# Patient Record
Sex: Female | Born: 1955 | Race: Black or African American | Hispanic: No | Marital: Married | Smoking: Former smoker
Health system: Southern US, Community
[De-identification: ages and names within clinical notes are randomized; demographics above are authoritative.]

## PROBLEM LIST (undated history)

## (undated) DIAGNOSIS — D709 Neutropenia, unspecified: Secondary | ICD-10-CM

## (undated) DIAGNOSIS — G4700 Insomnia, unspecified: Secondary | ICD-10-CM

## (undated) DIAGNOSIS — M79673 Pain in unspecified foot: Secondary | ICD-10-CM

## (undated) DIAGNOSIS — N951 Menopausal and female climacteric states: Secondary | ICD-10-CM

## (undated) DIAGNOSIS — J069 Acute upper respiratory infection, unspecified: Secondary | ICD-10-CM

## (undated) DIAGNOSIS — E669 Obesity, unspecified: Secondary | ICD-10-CM

## (undated) DIAGNOSIS — K7581 Nonalcoholic steatohepatitis (NASH): Secondary | ICD-10-CM

## (undated) DIAGNOSIS — D509 Iron deficiency anemia, unspecified: Secondary | ICD-10-CM

## (undated) DIAGNOSIS — T7840XA Allergy, unspecified, initial encounter: Secondary | ICD-10-CM

## (undated) DIAGNOSIS — R7989 Other specified abnormal findings of blood chemistry: Secondary | ICD-10-CM

## (undated) DIAGNOSIS — R739 Hyperglycemia, unspecified: Secondary | ICD-10-CM

## (undated) DIAGNOSIS — M8580 Other specified disorders of bone density and structure, unspecified site: Secondary | ICD-10-CM

## (undated) DIAGNOSIS — D500 Iron deficiency anemia secondary to blood loss (chronic): Secondary | ICD-10-CM

## (undated) DIAGNOSIS — E785 Hyperlipidemia, unspecified: Secondary | ICD-10-CM

## (undated) DIAGNOSIS — E559 Vitamin D deficiency, unspecified: Secondary | ICD-10-CM

## (undated) DIAGNOSIS — M549 Dorsalgia, unspecified: Secondary | ICD-10-CM

## (undated) DIAGNOSIS — R944 Abnormal results of kidney function studies: Secondary | ICD-10-CM

## (undated) DIAGNOSIS — R7303 Prediabetes: Secondary | ICD-10-CM

## (undated) DIAGNOSIS — Z87891 Personal history of nicotine dependence: Secondary | ICD-10-CM

## (undated) DIAGNOSIS — R9431 Abnormal electrocardiogram [ECG] [EKG]: Secondary | ICD-10-CM

## (undated) DIAGNOSIS — Z6837 Body mass index (BMI) 37.0-37.9, adult: Secondary | ICD-10-CM

## (undated) DIAGNOSIS — E66812 Obesity, class 2: Secondary | ICD-10-CM

## (undated) DIAGNOSIS — R609 Edema, unspecified: Secondary | ICD-10-CM

## (undated) DIAGNOSIS — H409 Unspecified glaucoma: Secondary | ICD-10-CM

## (undated) DIAGNOSIS — K7689 Other specified diseases of liver: Secondary | ICD-10-CM

## (undated) DIAGNOSIS — R632 Polyphagia: Secondary | ICD-10-CM

## (undated) DIAGNOSIS — I10 Essential (primary) hypertension: Secondary | ICD-10-CM

## (undated) HISTORY — DX: Pain in unspecified foot: M79.673

## (undated) HISTORY — DX: Abnormal results of kidney function studies: R94.4

## (undated) HISTORY — DX: Nonalcoholic steatohepatitis (NASH): K75.81

## (undated) HISTORY — DX: Vitamin D deficiency, unspecified: E55.9

## (undated) HISTORY — DX: Acute upper respiratory infection, unspecified: J06.9

## (undated) HISTORY — DX: Other specified disorders of bone density and structure, unspecified site: M85.80

## (undated) HISTORY — DX: Neutropenia, unspecified: D70.9

## (undated) HISTORY — PX: HAND SURGERY: SHX662

## (undated) HISTORY — DX: Insomnia, unspecified: G47.00

## (undated) HISTORY — DX: Unspecified glaucoma: H40.9

## (undated) HISTORY — DX: Essential (primary) hypertension: I10

## (undated) HISTORY — DX: Personal history of nicotine dependence: Z87.891

## (undated) HISTORY — DX: Edema, unspecified: R60.9

## (undated) HISTORY — DX: Prediabetes: R73.03

## (undated) HISTORY — DX: Hyperlipidemia, unspecified: E78.5

## (undated) HISTORY — DX: Other specified diseases of liver: K76.89

## (undated) HISTORY — DX: Hyperglycemia, unspecified: R73.9

## (undated) HISTORY — DX: Polyphagia: R63.2

## (undated) HISTORY — DX: Dorsalgia, unspecified: M54.9

## (undated) HISTORY — DX: Other specified abnormal findings of blood chemistry: R79.89

## (undated) HISTORY — DX: Menopausal and female climacteric states: N95.1

## (undated) HISTORY — PX: DENTAL SURGERY: SHX609

## (undated) HISTORY — DX: Allergy, unspecified, initial encounter: T78.40XA

## (undated) HISTORY — DX: Obesity, class 2: E66.812

## (undated) HISTORY — DX: Morbid (severe) obesity due to excess calories: E66.01

## (undated) HISTORY — DX: Obesity, unspecified: E66.9

## (undated) HISTORY — PX: KNEE ARTHROSCOPY: SHX127

## (undated) HISTORY — DX: Abnormal electrocardiogram (ECG) (EKG): R94.31

## (undated) HISTORY — DX: Body mass index (BMI) 37.0-37.9, adult: Z68.37

## (undated) HISTORY — DX: Iron deficiency anemia, unspecified: D50.9

## (undated) HISTORY — PX: COLONOSCOPY: SHX174

## (undated) HISTORY — DX: Iron deficiency anemia secondary to blood loss (chronic): D50.0

---

## 1998-05-12 ENCOUNTER — Ambulatory Visit (HOSPITAL_COMMUNITY): Admission: RE | Admit: 1998-05-12 | Discharge: 1998-05-12 | Payer: Self-pay | Admitting: Gynecology

## 1998-05-12 ENCOUNTER — Encounter: Payer: Self-pay | Admitting: Gynecology

## 1999-05-19 ENCOUNTER — Encounter: Payer: Self-pay | Admitting: Gynecology

## 1999-05-19 ENCOUNTER — Ambulatory Visit (HOSPITAL_COMMUNITY): Admission: RE | Admit: 1999-05-19 | Discharge: 1999-05-19 | Payer: Self-pay | Admitting: Gynecology

## 2000-07-24 ENCOUNTER — Other Ambulatory Visit: Admission: RE | Admit: 2000-07-24 | Discharge: 2000-07-24 | Payer: Self-pay | Admitting: Gynecology

## 2000-09-19 ENCOUNTER — Encounter: Payer: Self-pay | Admitting: Gynecology

## 2000-09-19 ENCOUNTER — Ambulatory Visit (HOSPITAL_COMMUNITY): Admission: RE | Admit: 2000-09-19 | Discharge: 2000-09-19 | Payer: Self-pay | Admitting: Gynecology

## 2001-08-18 ENCOUNTER — Other Ambulatory Visit: Admission: RE | Admit: 2001-08-18 | Discharge: 2001-08-18 | Payer: Self-pay | Admitting: Gynecology

## 2001-09-26 ENCOUNTER — Ambulatory Visit (HOSPITAL_COMMUNITY): Admission: RE | Admit: 2001-09-26 | Discharge: 2001-09-26 | Payer: Self-pay | Admitting: Gynecology

## 2001-09-26 ENCOUNTER — Encounter: Payer: Self-pay | Admitting: Gynecology

## 2001-10-20 ENCOUNTER — Emergency Department (HOSPITAL_COMMUNITY): Admission: EM | Admit: 2001-10-20 | Discharge: 2001-10-20 | Payer: Self-pay | Admitting: *Deleted

## 2002-08-26 ENCOUNTER — Other Ambulatory Visit: Admission: RE | Admit: 2002-08-26 | Discharge: 2002-08-26 | Payer: Self-pay | Admitting: Gynecology

## 2002-11-03 ENCOUNTER — Ambulatory Visit (HOSPITAL_COMMUNITY): Admission: RE | Admit: 2002-11-03 | Discharge: 2002-11-03 | Payer: Self-pay | Admitting: Gynecology

## 2002-11-03 ENCOUNTER — Encounter: Payer: Self-pay | Admitting: Gynecology

## 2004-02-08 ENCOUNTER — Other Ambulatory Visit: Admission: RE | Admit: 2004-02-08 | Discharge: 2004-02-08 | Payer: Self-pay | Admitting: Gynecology

## 2004-03-23 ENCOUNTER — Ambulatory Visit: Payer: Self-pay | Admitting: Endocrinology

## 2004-09-14 ENCOUNTER — Ambulatory Visit (HOSPITAL_COMMUNITY): Admission: RE | Admit: 2004-09-14 | Discharge: 2004-09-14 | Payer: Self-pay | Admitting: Gynecology

## 2004-10-10 ENCOUNTER — Other Ambulatory Visit: Admission: RE | Admit: 2004-10-10 | Discharge: 2004-10-10 | Payer: Self-pay | Admitting: Gynecology

## 2005-03-02 ENCOUNTER — Ambulatory Visit: Payer: Self-pay | Admitting: Endocrinology

## 2005-05-21 ENCOUNTER — Ambulatory Visit: Payer: Self-pay | Admitting: Endocrinology

## 2005-09-17 ENCOUNTER — Ambulatory Visit (HOSPITAL_COMMUNITY): Admission: RE | Admit: 2005-09-17 | Discharge: 2005-09-17 | Payer: Self-pay | Admitting: Gynecology

## 2005-10-18 ENCOUNTER — Other Ambulatory Visit: Admission: RE | Admit: 2005-10-18 | Discharge: 2005-10-18 | Payer: Self-pay | Admitting: Gynecology

## 2005-12-18 ENCOUNTER — Ambulatory Visit: Payer: Self-pay | Admitting: Endocrinology

## 2006-09-19 ENCOUNTER — Ambulatory Visit (HOSPITAL_COMMUNITY): Admission: RE | Admit: 2006-09-19 | Discharge: 2006-09-19 | Payer: Self-pay | Admitting: Gynecology

## 2006-10-24 ENCOUNTER — Other Ambulatory Visit: Admission: RE | Admit: 2006-10-24 | Discharge: 2006-10-24 | Payer: Self-pay | Admitting: Gynecology

## 2006-11-11 ENCOUNTER — Ambulatory Visit: Payer: Self-pay | Admitting: Endocrinology

## 2006-11-11 LAB — CONVERTED CEMR LAB
ALT: 36 units/L — ABNORMAL HIGH (ref 0–35)
AST: 34 units/L (ref 0–37)
Albumin: 3.5 g/dL (ref 3.5–5.2)
BUN: 15 mg/dL (ref 6–23)
Basophils Absolute: 0 10*3/uL (ref 0.0–0.1)
Basophils Relative: 0.9 % (ref 0.0–1.0)
Bilirubin Urine: NEGATIVE
Bilirubin, Direct: 0.1 mg/dL (ref 0.0–0.3)
Cholesterol: 143 mg/dL (ref 0–200)
Eosinophils Relative: 5.4 % — ABNORMAL HIGH (ref 0.0–5.0)
GFR calc Af Amer: 86 mL/min
HCT: 35.3 % — ABNORMAL LOW (ref 36.0–46.0)
LDL Cholesterol: 80 mg/dL (ref 0–99)
Lymphocytes Relative: 46.7 % — ABNORMAL HIGH (ref 12.0–46.0)
Monocytes Absolute: 0.4 10*3/uL (ref 0.2–0.7)
Monocytes Relative: 13.3 % — ABNORMAL HIGH (ref 3.0–11.0)
Neutrophils Relative %: 33.7 % — ABNORMAL LOW (ref 43.0–77.0)
Potassium: 3.9 meq/L (ref 3.5–5.1)
RDW: 12.8 % (ref 11.5–14.6)
Sodium: 146 meq/L — ABNORMAL HIGH (ref 135–145)
Triglycerides: 48 mg/dL (ref 0–149)
WBC: 2.9 10*3/uL — ABNORMAL LOW (ref 4.5–10.5)

## 2006-11-15 ENCOUNTER — Ambulatory Visit: Payer: Self-pay | Admitting: Endocrinology

## 2006-11-15 LAB — CONVERTED CEMR LAB
ALT: 41 units/L — ABNORMAL HIGH (ref 0–35)
AST: 40 units/L — ABNORMAL HIGH (ref 0–37)
Albumin: 3.7 g/dL (ref 3.5–5.2)
Hepatitis B Surface Ag: NEGATIVE
Total Bilirubin: 0.8 mg/dL (ref 0.3–1.2)

## 2007-02-01 ENCOUNTER — Encounter: Payer: Self-pay | Admitting: *Deleted

## 2007-02-01 DIAGNOSIS — I10 Essential (primary) hypertension: Secondary | ICD-10-CM

## 2007-02-01 DIAGNOSIS — E785 Hyperlipidemia, unspecified: Secondary | ICD-10-CM | POA: Insufficient documentation

## 2007-02-01 DIAGNOSIS — G4700 Insomnia, unspecified: Secondary | ICD-10-CM | POA: Insufficient documentation

## 2007-02-01 DIAGNOSIS — Z87891 Personal history of nicotine dependence: Secondary | ICD-10-CM

## 2007-02-01 HISTORY — DX: Insomnia, unspecified: G47.00

## 2007-02-01 HISTORY — DX: Essential (primary) hypertension: I10

## 2007-02-01 HISTORY — DX: Hyperlipidemia, unspecified: E78.5

## 2007-02-01 HISTORY — DX: Personal history of nicotine dependence: Z87.891

## 2007-03-03 ENCOUNTER — Ambulatory Visit: Payer: Self-pay | Admitting: Gastroenterology

## 2007-03-10 ENCOUNTER — Ambulatory Visit: Payer: Self-pay | Admitting: Gastroenterology

## 2007-09-22 ENCOUNTER — Ambulatory Visit (HOSPITAL_COMMUNITY): Admission: RE | Admit: 2007-09-22 | Discharge: 2007-09-22 | Payer: Self-pay | Admitting: Gynecology

## 2007-10-28 ENCOUNTER — Encounter: Payer: Self-pay | Admitting: Endocrinology

## 2007-12-21 ENCOUNTER — Encounter: Payer: Self-pay | Admitting: Endocrinology

## 2008-01-02 ENCOUNTER — Ambulatory Visit: Payer: Self-pay

## 2008-01-02 ENCOUNTER — Ambulatory Visit: Payer: Self-pay | Admitting: Endocrinology

## 2008-01-02 ENCOUNTER — Encounter (INDEPENDENT_AMBULATORY_CARE_PROVIDER_SITE_OTHER): Payer: Self-pay | Admitting: *Deleted

## 2008-01-02 DIAGNOSIS — R609 Edema, unspecified: Secondary | ICD-10-CM | POA: Insufficient documentation

## 2008-01-02 DIAGNOSIS — M25579 Pain in unspecified ankle and joints of unspecified foot: Secondary | ICD-10-CM | POA: Insufficient documentation

## 2008-01-02 HISTORY — DX: Edema, unspecified: R60.9

## 2008-01-05 ENCOUNTER — Encounter: Payer: Self-pay | Admitting: Endocrinology

## 2008-01-08 ENCOUNTER — Ambulatory Visit: Payer: Self-pay | Admitting: Endocrinology

## 2008-01-11 LAB — CONVERTED CEMR LAB
Albumin: 3.6 g/dL (ref 3.5–5.2)
Alkaline Phosphatase: 48 units/L (ref 39–117)
BUN: 14 mg/dL (ref 6–23)
Bilirubin Urine: NEGATIVE
Cholesterol: 119 mg/dL (ref 0–200)
Creatinine, Ser: 0.9 mg/dL (ref 0.4–1.2)
GFR calc Af Amer: 85 mL/min
GFR calc non Af Amer: 70 mL/min
Glucose, Bld: 82 mg/dL (ref 70–99)
HDL: 51 mg/dL (ref >39.0)
Hemoglobin, Urine: NEGATIVE
Ketones, ur: NEGATIVE mg/dL
LDL Cholesterol: 59 mg/dL (ref 0–99)
Leukocytes, UA: NEGATIVE
Potassium: 3.9 meq/L (ref 3.5–5.1)
Specific Gravity, Urine: 1.015 (ref 1.000–1.03)
TSH: 0.57 microintl units/mL (ref 0.35–5.50)
Total CHOL/HDL Ratio: 2.3
Total Protein: 6.8 g/dL (ref 6.0–8.3)
Triglycerides: 46 mg/dL (ref 0–149)
Urine Glucose: NEGATIVE mg/dL
Urobilinogen, UA: 0.2 (ref 0.0–1.0)
VLDL: 9 mg/dL (ref 0–40)

## 2008-01-16 ENCOUNTER — Ambulatory Visit: Payer: Self-pay | Admitting: Endocrinology

## 2008-01-28 ENCOUNTER — Encounter: Payer: Self-pay | Admitting: Endocrinology

## 2008-02-10 ENCOUNTER — Telehealth: Payer: Self-pay | Admitting: Endocrinology

## 2008-02-13 ENCOUNTER — Telehealth (INDEPENDENT_AMBULATORY_CARE_PROVIDER_SITE_OTHER): Payer: Self-pay | Admitting: *Deleted

## 2008-02-18 ENCOUNTER — Encounter: Payer: Self-pay | Admitting: Endocrinology

## 2008-04-05 ENCOUNTER — Ambulatory Visit: Payer: Self-pay | Admitting: Endocrinology

## 2008-04-05 DIAGNOSIS — M79609 Pain in unspecified limb: Secondary | ICD-10-CM | POA: Insufficient documentation

## 2008-04-05 DIAGNOSIS — D500 Iron deficiency anemia secondary to blood loss (chronic): Secondary | ICD-10-CM

## 2008-04-05 HISTORY — DX: Iron deficiency anemia secondary to blood loss (chronic): D50.0

## 2008-09-23 ENCOUNTER — Ambulatory Visit (HOSPITAL_COMMUNITY): Admission: RE | Admit: 2008-09-23 | Discharge: 2008-09-23 | Payer: Self-pay | Admitting: Gynecology

## 2008-10-05 LAB — HM PAP SMEAR

## 2009-01-28 ENCOUNTER — Ambulatory Visit: Payer: Self-pay | Admitting: Endocrinology

## 2009-01-29 LAB — CONVERTED CEMR LAB
Albumin: 3.9 g/dL (ref 3.5–5.2)
Basophils Relative: 0.9 % (ref 0.0–3.0)
CO2: 31 meq/L (ref 19–32)
Chloride: 102 meq/L (ref 96–112)
Creatinine, Ser: 0.8 mg/dL (ref 0.4–1.2)
Eosinophils Absolute: 0.2 10*3/uL (ref 0.0–0.7)
Glucose, Bld: 85 mg/dL (ref 70–99)
Hemoglobin, Urine: NEGATIVE
Hemoglobin: 12.6 g/dL (ref 12.0–15.0)
Leukocytes, UA: NEGATIVE
MCHC: 33.1 g/dL (ref 30.0–36.0)
MCV: 88.6 fL (ref 78.0–100.0)
Monocytes Absolute: 0.5 10*3/uL (ref 0.1–1.0)
Neutro Abs: 0.8 10*3/uL — ABNORMAL LOW (ref 1.4–7.7)
Nitrite: NEGATIVE
RBC: 4.29 M/uL (ref 3.87–5.11)
RDW: 12.3 % (ref 11.5–14.6)
Sodium: 142 meq/L (ref 135–145)
TSH: 1.82 microintl units/mL (ref 0.35–5.50)
Total CHOL/HDL Ratio: 3
Total Protein: 7.5 g/dL (ref 6.0–8.3)
Triglycerides: 58 mg/dL (ref 0.0–149.0)
Urobilinogen, UA: 0.2 (ref 0.0–1.0)

## 2009-01-31 ENCOUNTER — Ambulatory Visit: Payer: Self-pay | Admitting: Endocrinology

## 2009-01-31 DIAGNOSIS — D709 Neutropenia, unspecified: Secondary | ICD-10-CM | POA: Insufficient documentation

## 2009-01-31 HISTORY — DX: Neutropenia, unspecified: D70.9

## 2009-02-01 ENCOUNTER — Ambulatory Visit: Payer: Self-pay | Admitting: Oncology

## 2009-02-16 ENCOUNTER — Encounter: Payer: Self-pay | Admitting: Oncology

## 2009-02-16 ENCOUNTER — Other Ambulatory Visit: Admission: RE | Admit: 2009-02-16 | Discharge: 2009-02-16 | Payer: Self-pay | Admitting: Oncology

## 2009-02-16 ENCOUNTER — Encounter: Payer: Self-pay | Admitting: Endocrinology

## 2009-02-16 LAB — CBC WITH DIFFERENTIAL/PLATELET
Eosinophils Absolute: 0.2 10*3/uL (ref 0.0–0.5)
MCV: 86 fL (ref 79.5–101.0)
MONO%: 10.5 % (ref 0.0–14.0)
NEUT#: 1.2 10*3/uL — ABNORMAL LOW (ref 1.5–6.5)
RBC: 4.56 10*6/uL (ref 3.70–5.45)
RDW: 13.1 % (ref 11.2–14.5)
WBC: 3.2 10*3/uL — ABNORMAL LOW (ref 3.9–10.3)
nRBC: 0 % (ref 0–0)

## 2009-02-16 LAB — COMPREHENSIVE METABOLIC PANEL
ALT: 29 U/L (ref 0–35)
AST: 27 U/L (ref 0–37)
Alkaline Phosphatase: 56 U/L (ref 39–117)
CO2: 26 mEq/L (ref 19–32)
Sodium: 140 mEq/L (ref 135–145)
Total Bilirubin: 0.4 mg/dL (ref 0.3–1.2)
Total Protein: 7.4 g/dL (ref 6.0–8.3)

## 2009-02-16 LAB — CHCC SMEAR

## 2009-03-28 ENCOUNTER — Encounter: Payer: Self-pay | Admitting: Endocrinology

## 2009-09-26 ENCOUNTER — Ambulatory Visit (HOSPITAL_COMMUNITY): Admission: RE | Admit: 2009-09-26 | Discharge: 2009-09-26 | Payer: Self-pay | Admitting: Gynecology

## 2009-09-28 ENCOUNTER — Encounter: Admission: RE | Admit: 2009-09-28 | Discharge: 2009-09-28 | Payer: Self-pay | Admitting: Gynecology

## 2009-10-31 ENCOUNTER — Encounter: Payer: Self-pay | Admitting: Endocrinology

## 2009-11-14 ENCOUNTER — Ambulatory Visit (HOSPITAL_BASED_OUTPATIENT_CLINIC_OR_DEPARTMENT_OTHER): Admission: RE | Admit: 2009-11-14 | Discharge: 2009-11-14 | Payer: Self-pay | Admitting: Orthopedic Surgery

## 2010-02-01 ENCOUNTER — Ambulatory Visit: Payer: Self-pay | Admitting: Endocrinology

## 2010-02-01 LAB — CONVERTED CEMR LAB
Alkaline Phosphatase: 55 units/L (ref 39–117)
Basophils Absolute: 0 10*3/uL (ref 0.0–0.1)
Basophils Relative: 0.7 % (ref 0.0–3.0)
Bilirubin Urine: NEGATIVE
Bilirubin, Direct: 0.1 mg/dL (ref 0.0–0.3)
CO2: 30 meq/L (ref 19–32)
Calcium: 9.3 mg/dL (ref 8.4–10.5)
Creatinine, Ser: 0.9 mg/dL (ref 0.4–1.2)
Eosinophils Absolute: 0.1 10*3/uL (ref 0.0–0.7)
HDL: 45.4 mg/dL (ref >39.00)
Hemoglobin, Urine: NEGATIVE
Ketones, ur: NEGATIVE mg/dL
LDL Cholesterol: 84 mg/dL (ref 0–99)
Leukocytes, UA: NEGATIVE
Lymphocytes Relative: 49 % — ABNORMAL HIGH (ref 12.0–46.0)
MCHC: 34.2 g/dL (ref 30.0–36.0)
Neutrophils Relative %: 31.2 % — ABNORMAL LOW (ref 43.0–77.0)
RBC: 4.39 M/uL (ref 3.87–5.11)
Total CHOL/HDL Ratio: 3
Total Protein: 7.3 g/dL (ref 6.0–8.3)
Triglycerides: 56 mg/dL (ref 0.0–149.0)
Urobilinogen, UA: 0.2 (ref 0.0–1.0)

## 2010-02-07 ENCOUNTER — Ambulatory Visit: Payer: Self-pay | Admitting: Endocrinology

## 2010-02-07 DIAGNOSIS — K7689 Other specified diseases of liver: Secondary | ICD-10-CM

## 2010-02-07 DIAGNOSIS — K7581 Nonalcoholic steatohepatitis (NASH): Secondary | ICD-10-CM | POA: Insufficient documentation

## 2010-02-07 HISTORY — DX: Other specified diseases of liver: K76.89

## 2010-02-14 ENCOUNTER — Telehealth: Payer: Self-pay | Admitting: Internal Medicine

## 2010-03-27 ENCOUNTER — Telehealth: Payer: Self-pay | Admitting: Endocrinology

## 2010-05-28 ENCOUNTER — Encounter: Payer: Self-pay | Admitting: Gynecology

## 2010-06-04 LAB — CONVERTED CEMR LAB: Pap Smear: NORMAL

## 2010-06-06 NOTE — Letter (Signed)
Summary: Gretta Cool MD  Gretta Cool MD   Imported By: Lester Sandusky 11/10/2009 10:04:36  _____________________________________________________________________  External Attachment:    Type:   Image     Comment:   External Document

## 2010-06-06 NOTE — Progress Notes (Signed)
Summary: rx refill req  Phone Note Refill Request Message from:  Fax from Pharmacy on March 27, 2010 11:40 AM  Refills Requested: Medication #1:  ZOCOR 40 MG  TABS take 1 by mouth qd   Dosage confirmed as above?Dosage Confirmed  Medication #2:  LOTENSIN HCT 20-12.5 MG  TABS take 1 by mouth qd   Dosage confirmed as above?Dosage Confirmed  Method Requested: Fax to [facility] Next Appointment Scheduled: none Initial call taken by: Brenton Grills CMA Duncan Dull),  March 27, 2010 11:42 AM    Prescriptions: ZOCOR 40 MG  TABS (SIMVASTATIN) take 1 by mouth qd  #90 x 3   Entered by:   Brenton Grills CMA (AAMA)   Authorized by:   Minus Breeding MD   Signed by:   Brenton Grills CMA (AAMA) on 03/27/2010   Method used:   Faxed to ...       [facility])       [address] [address]       [place], [place]  [address]       Ph: [phone]       Fax: [phone]   RxID:   [id number] LOTENSIN HCT 20-12.5 MG  TABS (BENAZEPRIL-HYDROCHLOROTHIAZIDE) take 1 by mouth qd  #90 x 3   Entered by:   Brenton Grills CMA (AAMA)   Authorized by:   Minus Breeding MD   Signed by:   Brenton Grills CMA (AAMA) on 03/27/2010   Method used:   Faxed to ...       [facility])       [address] [address]       [place], [place]  [address]       Ph: [phone]       Fax: [phone]   RxID:   [id number]

## 2010-06-06 NOTE — Assessment & Plan Note (Signed)
Summary: yearly appt/bcbs/cd   Vital Signs:  Patient profile:   55 year old female Height:      68 inches Weight:      214 pounds BMI:     32.66 O2 Sat:      97 % on Room air Temp:     98.0 degrees F oral Pulse rate:   85 / minute BP sitting:   130 / 80  (left arm) Cuff size:   regular  Vitals Entered By: Alysia Penna (February 07, 2010 4:04 PM)  O2 Flow:  Room air CC: pt here for cpx. /cp sma   CC:  pt here for cpx. /cp sma.  History of Present Illness: here for regular wellness examination.  she's feeling pretty well in general, and does not drink or smoke.   Current Medications (verified): 1)  Lotensin Hct 20-12.5 Mg  Tabs (Benazepril-Hydrochlorothiazide) .... Take 1 By Mouth Qd 2)  Zocor 40 Mg  Tabs (Simvastatin) .... Take 1 By Mouth Qd 3)  Ultracet 37.5-325 Mg Tabs (Tramadol-Acetaminophen) .Marland Kitchen.. 1 Q4h As Needed 4)  Lastacaft 0.25 % Soln (Alcaftadine) .... 2 Drops Daily in Each Eye 5)  Multivitamins  Tabs (Multiple Vitamin) .... Take 2 Tablets Daily 6)  Omega-3 1400 Mg Caps (Omega-3 Fatty Acids) .... Take 1 Capsule Daily 7)  Calcium Citrate + W/ Vitamin D 630mg  .... Take 2 Tablets Daily 8)  Super B Complex  Tabs (B Complex-C) .... Take 1 Tablet By Mouth Daily 9)  Vitamin E 1000 Unit Caps (Vitamin E) .... Take 1 Capsule Daily 10)  Echinacea 760mg  .... Take 1 Capsule Daily 11)  Coq 10 .... Take 1 Tablet Daily 12)  Vitamin C 1000 Mg Tabs (Ascorbic Acid) .... Take 1 Tablet Daily 13)  Chelated Magnesium 400 Mg Tabs (Magnesium) .... Take 1 Tablet Daily 14)  Aspirin 81 Mg Tabs (Aspirin) .... Take 1 Tablet Daily 15)  Biotin 1000 Mcg Tabs (Biotin) .... Take 1 Tablet Daily 16)  Cetirizine Hcl 10 Mg Tabs (Cetirizine Hcl) .... Take 1 Tablet Daily 17)  Vitamin B-12 1000 Mcg Tabs (Cyanocobalamin) .... Take 1 Tablet By Mouth 18)  High Potency D-3 5000 Iu .... Take 1 Daily 19)  Resveratrol 100 Mg Caps (Resveratrol) .... Take 1 Tablet Daily  Allergies (verified): No Known Drug  Allergies  Past History:  Past Medical History: Last updated: 02/01/2007 Hyperlipidemia Hypertension  Family History: Reviewed history from 01/16/2008 and no changes required. mother had lung cancer  Social History: Reviewed history from 01/31/2009 and no changes required. 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         she has lost a few lbs, due to her efforts.    Physical Exam  General:  normal appearance.   Head:  head: no deformity eyes: no periorbital swelling, no proptosis external nose and ears are normal mouth: no lesion seen Neck:  Supple without thyroid enlargement or tenderness.  Breasts:  sees gyn  Lungs:  Clear to auscultation bilaterally. Normal respiratory effort.  Heart:  Regular rate and rhythm without murmurs or gallops noted. Normal S1,S2.   Abdomen:  abdomen is soft, nontender.  no hepatosplenomegaly.   not distended.  no hernia  Rectal:  sees gyn  Genitalia:  sees gyn  Msk:  muscle bulk and strength are  grossly normal.  no obvious joint swelling.  gait is normal and steady  Pulses:  dorsalis pedis intact bilat.  no carotid bruit  Extremities:  no deformity.  no ulcer on the feet.  feet are of normal color and temp.  no edema  Neurologic:  cn 2-12 grossly intact.   readily moves all 4's.   sensation is intact to touch on the feet  Skin:  normal texture and temp.  no rash seen.  not diaphoretic  Cervical Nodes:  No significant adenopathy.  Psych:  Alert and cooperative; normal mood and affect; normal attention span and concentration.     Impression & Recommendations:  Problem # 1:  ROUTINE GENERAL MEDICAL EXAM@HEALTH  CARE FACL (ICD-V70.0)  Medications Added to Medication  List This Visit: 1)  Lastacaft 0.25 % Soln (Alcaftadine) .... 2 drops daily in each eye 2)  Multivitamins Tabs (Multiple vitamin) .... Take 2 tablets daily 3)  Omega-3 1400 Mg Caps (Omega-3 fatty acids) .... Take 1 capsule daily 4)  Calcium Citrate + W/ Vitamin D 630mg   .... Take 2 tablets daily 5)  Super B Complex Tabs (B complex-c) .... Take 1 tablet by mouth daily 6)  Vitamin E 1000 Unit Caps (Vitamin e) .... Take 1 capsule daily 7)  Echinacea 760mg   .... Take 1 capsule daily 8)  Coq 10  .... Take 1 tablet daily 9)  Vitamin C 1000 Mg Tabs (Ascorbic acid) .... Take 1 tablet daily 10)  Chelated Magnesium 400 Mg Tabs (magnesium)  .... Take 1 tablet daily 11)  Aspirin 81 Mg Tabs (Aspirin) .... Take 1 tablet daily 12)  Biotin 1000 Mcg Tabs (Biotin) .... Take 1 tablet daily 13)  Cetirizine Hcl 10 Mg Tabs (Cetirizine hcl) .... Take 1 tablet daily 14)  Vitamin B-12 1000 Mcg Tabs (Cyanocobalamin) .... Take 1 tablet by mouth 15)  High Potency D-3 5000 Iu  .... Take 1 daily 16)  Resveratrol 100 Mg Caps (Resveratrol) .... Take 1 tablet daily  Other Orders: Est. Patient 40-64 years ([address])  Preventive Care Screening     gyn is dr Nicholas Lose, whi does dexa and mammography   Patient Instructions: 1)  please consider these measures for your health:  minimize alcohol.  do not use tobacco products.  have a colonoscopy at least every 10 years from age 52.  keep firearms safely stored.  always use seat belts.  have working smoke alarms in your home.  see an eye doctor and dentist regularly.  never drive under the influence of alcohol or drugs (including prescription drugs).   2)  please let me know what your wishes would be, if artificial life support measures should become necessary.  it is critically important to prevent falling down (keep floor areas well-lit, dry, and free of loose objects).   Prescriptions: ULTRACET 37.5-325 MG TABS (TRAMADOL-ACETAMINOPHEN) 1 q4h as needed  #50 x 1   Entered and  Authorized by:   Minus Breeding MD   Signed by:   Minus Breeding MD on 02/07/2010   Method used:   Electronically to        [facility]  [address] [id number] ([facility])       [address]       [place], [place]  [address]       Ph: [phone] or [phone]       Fax: [phone]   RxID:   [id number]    Preventive Care Screening  gyn is dr Nicholas Lose, whi does dexa and mammography

## 2010-06-06 NOTE — Progress Notes (Signed)
Summary: Med change/SAE pt  Phone Note Call from Patient Call back at Home Phone [phone]   Caller: Patient Summary of Call: Pt called stating that at CPX with SAE she requested a sleep aid, Trazadone. Pt instead was prescribed and had medication filled for Tramadol. Pt is requesting med change, please advise. Initial call taken by: Crissie Sickles, [place],  February 14, 2010 9:50 AM    New/Updated Medications: TRAZODONE HCL 50 MG TABS (TRAZODONE HCL) 1po at bedtime as needed sleep Prescriptions: TRAZODONE HCL 50 MG TABS (TRAZODONE HCL) 1po at bedtime as needed sleep  #30 x 3   Entered and Authorized by:   Biagio Borg MD   Signed by:   Biagio Borg MD on 02/14/2010   Method used:   Print then Give to Patient   RxID:   [id number]  done hardcopy to LIM side B - dahlia  Biagio Borg MD  February 14, 2010 1:12 PM   Pt informed, Rx faxed to [facility] per pt req Crissie Sickles, CMA  February 14, 2010 1:43 PM

## 2010-07-23 LAB — BASIC METABOLIC PANEL
BUN: 10 mg/dL (ref 6–23)
CO2: 28 mEq/L (ref 19–32)
Chloride: 104 mEq/L (ref 96–112)
Glucose, Bld: 89 mg/dL (ref 70–99)
Potassium: 3.8 mEq/L (ref 3.5–5.1)
Sodium: 139 mEq/L (ref 135–145)

## 2010-07-31 ENCOUNTER — Telehealth: Payer: Self-pay

## 2010-07-31 MED ORDER — BENAZEPRIL-HYDROCHLOROTHIAZIDE 20-12.5 MG PO TABS: 1.0000 | ORAL_TABLET | Freq: Every day | ORAL | Status: DC

## 2010-07-31 MED ORDER — SIMVASTATIN 40 MG PO TABS: 40.0000 mg | ORAL_TABLET | Freq: Every day | ORAL | Status: DC

## 2010-07-31 NOTE — Telephone Encounter (Signed)
Pt called stating she has changed Rx pharmacy to [facility] Dr. Rock Nephew is requesting new Rxs to [facility].

## 2011-01-09 ENCOUNTER — Telehealth: Payer: Self-pay | Admitting: *Deleted

## 2011-01-09 DIAGNOSIS — Z0000 Encounter for general adult medical examination without abnormal findings: Secondary | ICD-10-CM

## 2011-01-09 NOTE — Telephone Encounter (Signed)
Message copied by [name] Primrose on Tue Jan 09, 2011  4:35 PM ------      Message from: Etheleen Sia      Created: Tue Jan 09, 2011  4:13 PM      Regarding: PHYSICAL LABS       PT IS SCHEDULED FOR A CPX ON OCT 15

## 2011-01-09 NOTE — Telephone Encounter (Signed)
Labs placed into Epic for upcoming CPX

## 2011-01-29 ENCOUNTER — Other Ambulatory Visit (HOSPITAL_COMMUNITY): Payer: Self-pay | Admitting: Gynecology

## 2011-01-29 DIAGNOSIS — Z1231 Encounter for screening mammogram for malignant neoplasm of breast: Secondary | ICD-10-CM

## 2011-02-01 ENCOUNTER — Telehealth: Payer: Self-pay | Admitting: Endocrinology

## 2011-02-01 NOTE — Telephone Encounter (Signed)
[facility] and [facility] med.  Pt's ph#:  [phone].  [facility]:  [phone].

## 2011-02-02 MED ORDER — SIMVASTATIN 40 MG PO TABS: 40.0000 mg | ORAL_TABLET | Freq: Every day | ORAL | Status: DC

## 2011-02-02 MED ORDER — BENAZEPRIL-HYDROCHLOROTHIAZIDE 20-12.5 MG PO TABS: 1.0000 | ORAL_TABLET | Freq: Every day | ORAL | Status: DC

## 2011-02-05 ENCOUNTER — Ambulatory Visit (HOSPITAL_COMMUNITY)
Admission: RE | Admit: 2011-02-05 | Discharge: 2011-02-05 | Disposition: A | Discharge status: Home / Self Care | Payer: BC Managed Care – PPO | Source: Ambulatory Visit | Attending: Gynecology | Admitting: Gynecology

## 2011-02-05 DIAGNOSIS — Z1231 Encounter for screening mammogram for malignant neoplasm of breast: Secondary | ICD-10-CM | POA: Insufficient documentation

## 2011-02-16 ENCOUNTER — Other Ambulatory Visit (INDEPENDENT_AMBULATORY_CARE_PROVIDER_SITE_OTHER): Payer: BC Managed Care – PPO

## 2011-02-16 DIAGNOSIS — Z0000 Encounter for general adult medical examination without abnormal findings: Secondary | ICD-10-CM

## 2011-02-16 LAB — URINALYSIS, ROUTINE W REFLEX MICROSCOPIC
Bilirubin Urine: NEGATIVE
Hgb urine dipstick: NEGATIVE
Nitrite: NEGATIVE
Total Protein, Urine: NEGATIVE
Urine Glucose: NEGATIVE
Urobilinogen, UA: 0.2 (ref 0.0–1.0)

## 2011-02-16 LAB — CBC WITH DIFFERENTIAL/PLATELET
Basophils Relative: 0.7 % (ref 0.0–3.0)
Eosinophils Absolute: 0.4 10*3/uL (ref 0.0–0.7)
Hemoglobin: 12.8 g/dL (ref 12.0–15.0)
MCHC: 32.9 g/dL (ref 30.0–36.0)
MCV: 86.8 fl (ref 78.0–100.0)
Monocytes Absolute: 0.4 10*3/uL (ref 0.1–1.0)
Neutro Abs: 1.3 10*3/uL — ABNORMAL LOW (ref 1.4–7.7)
RBC: 4.47 Mil/uL (ref 3.87–5.11)
RDW: 14 % (ref 11.5–14.6)

## 2011-02-16 LAB — LIPID PANEL
Cholesterol: 145 mg/dL (ref 0–200)
HDL: 50.7 mg/dL (ref >39.00)
LDL Cholesterol: 77 mg/dL (ref 0–99)
VLDL: 17 mg/dL (ref 0.0–40.0)

## 2011-02-16 LAB — BASIC METABOLIC PANEL
BUN: 10 mg/dL (ref 6–23)
Calcium: 8.9 mg/dL (ref 8.4–10.5)
Creatinine, Ser: 0.8 mg/dL (ref 0.4–1.2)
GFR: 98.93 mL/min (ref >60.00)

## 2011-02-16 LAB — HEPATIC FUNCTION PANEL: Total Bilirubin: 0.5 mg/dL (ref 0.3–1.2)

## 2011-02-19 ENCOUNTER — Encounter: Payer: Self-pay | Admitting: Endocrinology

## 2011-02-19 ENCOUNTER — Ambulatory Visit (INDEPENDENT_AMBULATORY_CARE_PROVIDER_SITE_OTHER): Payer: BC Managed Care – PPO | Admitting: Endocrinology

## 2011-02-19 VITALS — BP 122/78 | HR 80 | Temp 98.3°F | Ht 69.0 in | Wt 220.5 lb

## 2011-02-19 DIAGNOSIS — Z0000 Encounter for general adult medical examination without abnormal findings: Secondary | ICD-10-CM

## 2011-02-19 DIAGNOSIS — I10 Essential (primary) hypertension: Secondary | ICD-10-CM

## 2011-02-19 DIAGNOSIS — R9431 Abnormal electrocardiogram [ECG] [EKG]: Secondary | ICD-10-CM | POA: Insufficient documentation

## 2011-02-19 NOTE — Patient Instructions (Addendum)
An "echocardiogram" (easy and painless heart test) is being requested for you today.  please call [phone] to hear your test results.  You will be prompted to enter the 9-digit "MRN" number that appears at the top left of this page, followed by #.  Then you will hear the message. please consider these measures for your health:  minimize alcohol.  do not use tobacco products.  have a colonoscopy at least every 10 years from age 54.  Women should have an annual mammogram from age 21.  keep firearms safely stored.  always use seat belts.  have working smoke alarms in your home.  see an eye doctor and dentist regularly.  never drive under the influence of alcohol or drugs (including prescription drugs).   Please return in 1 year.

## 2011-02-19 NOTE — Progress Notes (Signed)
Subjective:    Patient ID: Tonya Taylor, female    DOB: Aug 30, 1955, 55 y.o.   MRN: [id number]  HPI here for regular wellness examination.  she's feeling pretty well in general, and says chronic med probs are stable, except as noted below.  Gyn is dr lomax.   Past Medical History  Diagnosis Date  . ANEMIA DUE TO CHRONIC BLOOD LOSS 04/05/2008  . Edema 01/02/2008  . HYPERLIPIDEMIA 02/01/2007  . HYPERTENSION 02/01/2007  . INSOMNIA 02/01/2007  . NEUTROPENIA UNSPECIFIED 01/31/2009  . Other chronic nonalcoholic liver disease 02/07/2010  . TOBACCO USE, QUIT 02/01/2007    No past surgical history on file.  History   Social History  . Marital Status: Married    Spouse Name: N/A    Number of Children: N/A  . Years of Education: N/A   Occupational History  . Works for school system    Social History Main Topics  . Smoking status: Former Games developer  . Smokeless tobacco: Not on file  . Alcohol Use: Not on file  . Drug Use: No  . Sexually Active: Not on file   Other Topics Concern  . Not on file   Social History Narrative  . No narrative on file    Current Outpatient Prescriptions on File Prior to Visit  Medication Sig Dispense Refill  . Ascorbic Acid (VITAMIN C) 1000 MG tablet Take 1,000 mg by mouth daily.        Marland Kitchen aspirin 81 MG tablet Take 81 mg by mouth daily.        . B Complex-C (SUPER B COMPLEX) TABS Take 1 tablet by mouth daily.        . benazepril-hydrochlorthiazide (LOTENSIN HCT) 20-12.5 MG per tablet Take 1 tablet by mouth daily.  90 tablet  0  . Biotin 1000 MCG tablet Take 1,000 mcg by mouth daily.        . Calcium Carbonate-Vit D-Min (CALCIUM 600+D PLUS MINERALS PO) Take 2 tablets by mouth daily.        . cetirizine (ZYRTEC) 10 MG tablet Take 10 mg by mouth daily.        . Cholecalciferol (D 5000) 5000 UNITS TABS Take 1 tablet by mouth daily.        . Coenzyme Q10 (COQ10 PO) Take 1 capsule by mouth daily.        Marland Kitchen ECHINACEA PO Take 1 capsule by mouth daily. 760mg          . Magnesium 400 MG CAPS Take 1 capsule by mouth daily.        . Multiple Vitamin (MULTIVITAMIN) tablet Take 1 tablet by mouth daily.        . Omega-3 1400 MG CAPS Take 1 capsule by mouth daily.        Marland Kitchen RESVERATROL 100 MG CAPS Take 1 capsule by mouth daily.        . simvastatin (ZOCOR) 40 MG tablet Take 1 tablet (40 mg total) by mouth at bedtime.  90 tablet  0  . traMADol-acetaminophen (ULTRACET) 37.5-325 MG per tablet Take 1 tablet by mouth every 4 (four) hours as needed.        . traZODone (DESYREL) 50 MG tablet Take 50 mg by mouth at bedtime as needed. For sleep       . vitamin B-12 (CYANOCOBALAMIN) 1000 MCG tablet Take 1,000 mcg by mouth daily.        . vitamin E 1000 UNIT capsule Take 1,000 Units by mouth daily.  No Known Allergies  Family History  Problem Relation Age of Onset  . Cancer Mother     Lung Cancer    BP 122/78  Pulse 80  Temp(Src) 98.3 F (36.8 C) (Oral)  Ht 5\' 9"  (1.753 m)  Wt 220 lb 8 oz (100.018 kg)  BMI 32.56 kg/m2  SpO2 97%     Review of Systems  Constitutional: Negative for fever and unexpected weight change.  HENT: Negative for hearing loss.   Eyes: Negative for visual disturbance.  Respiratory: Negative for shortness of breath.   Cardiovascular: Negative for leg swelling.  Gastrointestinal: Negative for anal bleeding.  Genitourinary: Negative for hematuria.  Musculoskeletal: Negative for back pain.  Skin: Negative for rash.  Neurological: Negative for syncope, numbness and headaches.  Hematological: Does not bruise/bleed easily.  Psychiatric/Behavioral: Negative for dysphoric mood.       Objective:   Physical Exam VS: see vs page GEN: no distress HEAD: head: no deformity eyes: no periorbital swelling, no proptosis external nose and ears are normal mouth: no lesion seen NECK: supple, thyroid is not enlarged CHEST WALL: no deformity ABD: abdomen is soft, nontender.  no hepatosplenomegaly.  not distended.  no  hernia MUSCULOSKELETAL: muscle bulk and strength are grossly normal.  no obvious joint swelling.  gait is normal and steady EXTEMITIES: no deformity.  no ulcer on the feet.  feet are of normal color and temp.  no edema PULSES: dorsalis pedis intact bilat.  no carotid bruit NEURO:  cn 2-12 grossly intact.   readily moves all 4's.  sensation is intact to touch on the feet SKIN:  Normal texture and temperature.  No rash or suspicious lesion is visible.   NODES:  None palpable at the neck PSYCH: alert, oriented x3.  Does not appear anxious nor depressed.     Assessment & Plan:  Wellness visit today, with problems stable, except as noted.   SEPARATE EVALUATION FOLLOWS--EACH PROBLEM HERE IS NEW, NOT RESPONDING TO TREATMENT, OR POSES SIGNIFICANT RISK TO THE PATIENT'S HEALTH: HISTORY OF THE PRESENT ILLNESS: abnl ecg is noted today.  She denies chest pain PAST MEDICAL HISTORY reviewed and up to date today REVIEW OF SYSTEMS: Denies sob PHYSICAL EXAMINATION: VITAL SIGNS:  See vs page GENERAL: no distress LUNGS:  Clear to auscultation HEART:  Regular rate and rhythm without murmurs noted. Normal S1,S2.   LAB/XRAY RESULTS: i reviewed electrocardiogram IMPRESSION: abnl ecg, uncertain etiology, new PLAN: See instruction page

## 2011-02-22 ENCOUNTER — Other Ambulatory Visit: Payer: Self-pay | Admitting: Gynecology

## 2011-02-26 ENCOUNTER — Other Ambulatory Visit: Payer: Self-pay | Admitting: *Deleted

## 2011-02-26 MED ORDER — SIMVASTATIN 40 MG PO TABS: 40.0000 mg | ORAL_TABLET | Freq: Every day | ORAL | Status: DC

## 2011-02-26 MED ORDER — BENAZEPRIL-HYDROCHLOROTHIAZIDE 20-12.5 MG PO TABS: 1.0000 | ORAL_TABLET | Freq: Every day | ORAL | Status: DC

## 2011-02-26 NOTE — Telephone Encounter (Signed)
Pt needs refills sent to [facility]

## 2011-03-05 ENCOUNTER — Ambulatory Visit (HOSPITAL_COMMUNITY): Payer: BC Managed Care – PPO | Attending: Cardiology | Admitting: Radiology

## 2011-03-05 DIAGNOSIS — Z87891 Personal history of nicotine dependence: Secondary | ICD-10-CM | POA: Insufficient documentation

## 2011-03-05 DIAGNOSIS — E785 Hyperlipidemia, unspecified: Secondary | ICD-10-CM | POA: Insufficient documentation

## 2011-03-05 DIAGNOSIS — R9431 Abnormal electrocardiogram [ECG] [EKG]: Secondary | ICD-10-CM

## 2011-03-05 DIAGNOSIS — I10 Essential (primary) hypertension: Secondary | ICD-10-CM | POA: Insufficient documentation

## 2012-01-04 ENCOUNTER — Telehealth: Payer: Self-pay

## 2012-01-04 NOTE — Telephone Encounter (Signed)
LMOM to CB. See Radiology report at [facility]' desk. Sarah asked [place] to call pt to check status. Radiologist read the area on xray that was discussed when xray taken as a tiny fx. Pt should f/up if still having much pain and we can re-xray. This is a WC acct.

## 2012-01-06 NOTE — Telephone Encounter (Signed)
Patient notified results.  See radiology report.

## 2012-01-12 ENCOUNTER — Other Ambulatory Visit: Payer: Self-pay | Admitting: Endocrinology

## 2012-01-28 ENCOUNTER — Other Ambulatory Visit: Payer: Self-pay | Admitting: Gynecology

## 2012-01-28 DIAGNOSIS — Z1231 Encounter for screening mammogram for malignant neoplasm of breast: Secondary | ICD-10-CM

## 2012-02-11 ENCOUNTER — Ambulatory Visit
Admission: RE | Admit: 2012-02-11 | Discharge: 2012-02-11 | Disposition: A | Discharge status: Home / Self Care | Payer: BC Managed Care – PPO | Source: Ambulatory Visit | Attending: Gynecology | Admitting: Gynecology

## 2012-02-11 DIAGNOSIS — Z1231 Encounter for screening mammogram for malignant neoplasm of breast: Secondary | ICD-10-CM

## 2012-02-21 ENCOUNTER — Encounter: Payer: BC Managed Care – PPO | Admitting: Endocrinology

## 2012-02-22 ENCOUNTER — Encounter: Payer: BC Managed Care – PPO | Admitting: Endocrinology

## 2012-02-29 LAB — CBC WITH DIFFERENTIAL/PLATELET
Basophils Absolute: 0.1 10*3/uL (ref 0.0–0.1)
Basophils Relative: 2 % — ABNORMAL HIGH (ref 0–1)
Eosinophils Absolute: 0.2 10*3/uL (ref 0.0–0.7)
Eosinophils Relative: 8 % — ABNORMAL HIGH (ref 0–5)
Lymphs Abs: 1.5 10*3/uL (ref 0.7–4.0)
MCH: 28.3 pg (ref 26.0–34.0)
MCHC: 33 g/dL (ref 30.0–36.0)
MCV: 85.7 fL (ref 78.0–100.0)
Platelets: 247 10*3/uL (ref 150–400)
RDW: 14 % (ref 11.5–15.5)

## 2012-02-29 LAB — BASIC METABOLIC PANEL
Chloride: 102 mEq/L (ref 96–112)
Creat: 0.91 mg/dL (ref 0.50–1.10)
Sodium: 142 mEq/L (ref 135–145)

## 2012-02-29 LAB — HEPATIC FUNCTION PANEL
Albumin: 4 g/dL (ref 3.5–5.2)
Alkaline Phosphatase: 57 U/L (ref 39–117)
Total Protein: 6.9 g/dL (ref 6.0–8.3)

## 2012-02-29 LAB — LIPID PANEL
Cholesterol: 162 mg/dL (ref 0–200)
HDL: 50 mg/dL (ref >39)
Total CHOL/HDL Ratio: 3.2 Ratio
Triglycerides: 88 mg/dL (ref <150)
VLDL: 18 mg/dL (ref 0–40)

## 2012-02-29 LAB — TSH: TSH: 1.658 u[IU]/mL (ref 0.350–4.500)

## 2012-03-01 LAB — URINALYSIS, ROUTINE W REFLEX MICROSCOPIC
Bilirubin Urine: NEGATIVE
Nitrite: NEGATIVE
Specific Gravity, Urine: 1.018 (ref 1.005–1.030)
Urobilinogen, UA: 0.2 mg/dL (ref 0.0–1.0)
pH: 7.5 (ref 5.0–8.0)

## 2012-03-04 ENCOUNTER — Other Ambulatory Visit: Payer: Self-pay | Admitting: Gynecology

## 2012-03-05 ENCOUNTER — Encounter: Payer: Self-pay | Admitting: Endocrinology

## 2012-03-05 ENCOUNTER — Ambulatory Visit (INDEPENDENT_AMBULATORY_CARE_PROVIDER_SITE_OTHER): Payer: BC Managed Care – PPO | Admitting: Endocrinology

## 2012-03-05 VITALS — BP 142/80 | HR 78 | Temp 98.1°F | Wt 220.0 lb

## 2012-03-05 DIAGNOSIS — N951 Menopausal and female climacteric states: Secondary | ICD-10-CM

## 2012-03-05 DIAGNOSIS — Z0000 Encounter for general adult medical examination without abnormal findings: Secondary | ICD-10-CM

## 2012-03-05 NOTE — Patient Instructions (Addendum)
Let's check a bone-density test.   please consider these measures for your health:  minimize alcohol.  do not use tobacco products.  have a colonoscopy at least every 10 years from age 55.  Women should have an annual mammogram from age 47.  keep firearms safely stored.  always use seat belts.  have working smoke alarms in your home.  see an eye doctor and dentist regularly.  never drive under the influence of alcohol or drugs (including prescription drugs).   Please return in 1 year.

## 2012-03-05 NOTE — Progress Notes (Signed)
  Subjective:    Patient ID: Tonya Taylor, female    DOB: [birth year], 56 y.o.   MRN: [id number]  HPI    Review of Systems  Constitutional: Negative for fever and unexpected weight change.  HENT: Negative for hearing loss.   Eyes: Negative for visual disturbance.  Respiratory: Negative for shortness of breath.   Cardiovascular: Negative for chest pain.  Gastrointestinal: Negative for anal bleeding.  Genitourinary: Negative for hematuria.  Skin: Negative for rash.  Neurological: Negative for numbness.  Hematological: Does not bruise/bleed easily.  Psychiatric/Behavioral: Negative for dysphoric mood.       Objective:   Physical Exam VS: see vs page GEN: no distress HEAD: head: no deformity eyes: no periorbital swelling, no proptosis external nose and ears are normal mouth: no lesion seen NECK: supple, thyroid is not enlarged CHEST WALL: no deformity LUNGS:  Clear to auscultation BREASTS:  sees gyn CV: reg rate and rhythm, no murmur ABD: abdomen is soft, nontender.  no hepatosplenomegaly.  not distended.  no hernia GENITALIA/RECTAL: sees gyn MUSCULOSKELETAL: muscle bulk and strength are grossly normal.  no obvious joint swelling.  gait is normal and steady EXTEMITIES: no deformity.  no ulcer on the feet.  feet are of normal color and temp.  no edema PULSES: dorsalis pedis intact bilat.  no carotid bruit NEURO:  cn 2-12 grossly intact.   readily moves all 4's.  sensation is intact to touch on the feet SKIN:  Normal texture and temperature.  No rash or suspicious lesion is visible.   NODES:  None palpable at the neck PSYCH: alert, oriented x3.  Does not appear anxious nor depressed.        Assessment & Plan:  VS: see vs page GEN: no distress HEAD: head: no deformity eyes: no periorbital swelling, no proptosis external nose and ears are normal mouth: no lesion seen NECK: supple, thyroid is not enlarged CHEST WALL: no deformity LUNGS:  Clear to  auscultation BREASTS:  No mass.  No d/c CV: reg rate and rhythm, no murmur ABD: abdomen is soft, nontender.  no hepatosplenomegaly.  not distended.  no hernia GENITALIA:  Normal external female.  Normal bimanual exam RECTAL: normal external and internal exam.  heme neg MUSCULOSKELETAL: muscle bulk and strength are grossly normal.  no obvious joint swelling.  gait is normal and steady EXTEMITIES: no deformity.  no ulcer on the feet.  feet are of normal color and temp.  no edema PULSES: dorsalis pedis intact bilat.  no carotid bruit NEURO:  cn 2-12 grossly intact.   readily moves all 4's.  sensation is intact to touch on the feet SKIN:  Normal texture and temperature.  No rash or suspicious lesion is visible.   NODES:  None palpable at the neck PSYCH: alert, oriented x3.  Does not appear anxious nor depressed.

## 2012-03-10 ENCOUNTER — Other Ambulatory Visit: Payer: BC Managed Care – PPO

## 2012-03-18 ENCOUNTER — Encounter: Payer: Self-pay | Admitting: Endocrinology

## 2012-04-10 ENCOUNTER — Other Ambulatory Visit: Payer: Self-pay | Admitting: Endocrinology

## 2012-04-22 ENCOUNTER — Encounter (HOSPITAL_COMMUNITY): Payer: Self-pay | Admitting: *Deleted

## 2012-04-22 ENCOUNTER — Emergency Department (HOSPITAL_COMMUNITY)
Admission: EM | Admit: 2012-04-22 | Discharge: 2012-04-22 | Disposition: A | Discharge status: Home / Self Care | Payer: BC Managed Care – PPO | Attending: Emergency Medicine | Admitting: Emergency Medicine

## 2012-04-22 ENCOUNTER — Emergency Department (HOSPITAL_COMMUNITY): Payer: BC Managed Care – PPO

## 2012-04-22 DIAGNOSIS — Z8719 Personal history of other diseases of the digestive system: Secondary | ICD-10-CM | POA: Insufficient documentation

## 2012-04-22 DIAGNOSIS — I10 Essential (primary) hypertension: Secondary | ICD-10-CM | POA: Insufficient documentation

## 2012-04-22 DIAGNOSIS — Z862 Personal history of diseases of the blood and blood-forming organs and certain disorders involving the immune mechanism: Secondary | ICD-10-CM | POA: Insufficient documentation

## 2012-04-22 DIAGNOSIS — N200 Calculus of kidney: Secondary | ICD-10-CM | POA: Insufficient documentation

## 2012-04-22 DIAGNOSIS — E785 Hyperlipidemia, unspecified: Secondary | ICD-10-CM | POA: Insufficient documentation

## 2012-04-22 DIAGNOSIS — Z8669 Personal history of other diseases of the nervous system and sense organs: Secondary | ICD-10-CM | POA: Insufficient documentation

## 2012-04-22 DIAGNOSIS — Z7982 Long term (current) use of aspirin: Secondary | ICD-10-CM | POA: Insufficient documentation

## 2012-04-22 DIAGNOSIS — Z79899 Other long term (current) drug therapy: Secondary | ICD-10-CM | POA: Insufficient documentation

## 2012-04-22 DIAGNOSIS — Z87891 Personal history of nicotine dependence: Secondary | ICD-10-CM | POA: Insufficient documentation

## 2012-04-22 LAB — COMPREHENSIVE METABOLIC PANEL
ALT: 48 U/L — ABNORMAL HIGH (ref 0–35)
AST: 57 U/L — ABNORMAL HIGH (ref 0–37)
Albumin: 4 g/dL (ref 3.5–5.2)
Alkaline Phosphatase: 68 U/L (ref 39–117)
BUN: 13 mg/dL (ref 6–23)
CO2: 25 mEq/L (ref 19–32)
Calcium: 9.4 mg/dL (ref 8.4–10.5)
Chloride: 105 mEq/L (ref 96–112)
Creatinine, Ser: 0.94 mg/dL (ref 0.50–1.10)
GFR calc Af Amer: 78 mL/min — ABNORMAL LOW (ref >90)
GFR calc non Af Amer: 67 mL/min — ABNORMAL LOW (ref >90)
Glucose, Bld: 79 mg/dL (ref 70–99)
Potassium: 3.5 mEq/L (ref 3.5–5.1)
Sodium: 140 mEq/L (ref 135–145)
Total Bilirubin: 0.4 mg/dL (ref 0.3–1.2)
Total Protein: 7.9 g/dL (ref 6.0–8.3)

## 2012-04-22 LAB — CBC WITH DIFFERENTIAL/PLATELET
Basophils Absolute: 0 10*3/uL (ref 0.0–0.1)
Basophils Relative: 0 % (ref 0–1)
Eosinophils Absolute: 0.1 10*3/uL (ref 0.0–0.7)
Eosinophils Relative: 2 % (ref 0–5)
HCT: 41.9 % (ref 36.0–46.0)
Hemoglobin: 14 g/dL (ref 12.0–15.0)
Lymphocytes Relative: 34 % (ref 12–46)
Lymphs Abs: 1.8 10*3/uL (ref 0.7–4.0)
MCH: 28.5 pg (ref 26.0–34.0)
MCHC: 33.4 g/dL (ref 30.0–36.0)
MCV: 85.2 fL (ref 78.0–100.0)
Monocytes Absolute: 0.6 10*3/uL (ref 0.1–1.0)
Monocytes Relative: 11 % (ref 3–12)
Neutro Abs: 2.7 10*3/uL (ref 1.7–7.7)
Neutrophils Relative %: 52 % (ref 43–77)
Platelets: 214 10*3/uL (ref 150–400)
RBC: 4.92 MIL/uL (ref 3.87–5.11)
RDW: 14 % (ref 11.5–15.5)
WBC: 5.2 10*3/uL (ref 4.0–10.5)

## 2012-04-22 LAB — URINALYSIS, ROUTINE W REFLEX MICROSCOPIC
Bilirubin Urine: NEGATIVE
Glucose, UA: NEGATIVE mg/dL
Ketones, ur: NEGATIVE mg/dL
Leukocytes, UA: NEGATIVE
Nitrite: NEGATIVE
Protein, ur: NEGATIVE mg/dL
Specific Gravity, Urine: 1.016 (ref 1.005–1.030)
Urobilinogen, UA: 0.2 mg/dL (ref 0.0–1.0)
pH: 6.5 (ref 5.0–8.0)

## 2012-04-22 LAB — URINE MICROSCOPIC-ADD ON

## 2012-04-22 MED ORDER — MORPHINE SULFATE 4 MG/ML IJ SOLN
4.0000 mg | Freq: Once | INTRAMUSCULAR | Status: AC
  Administered 2012-04-22: 4 mg via INTRAVENOUS
  Filled 2012-04-22: qty 1

## 2012-04-22 MED ORDER — TAMSULOSIN HCL 0.4 MG PO CAPS
0.4000 mg | ORAL_CAPSULE | Freq: Once | ORAL | Status: AC
  Administered 2012-04-22: 0.4 mg via ORAL
  Filled 2012-04-22: qty 1

## 2012-04-22 MED ORDER — SODIUM CHLORIDE 0.9 % IV SOLN
Freq: Once | INTRAVENOUS | Status: AC
  Administered 2012-04-22: 21:00:00 via INTRAVENOUS

## 2012-04-22 MED ORDER — HYDROCODONE-ACETAMINOPHEN 5-325 MG PO TABS: 2.0000 | ORAL_TABLET | ORAL | Status: DC | PRN

## 2012-04-22 MED ORDER — TAMSULOSIN HCL 0.4 MG PO CAPS: 0.4000 mg | ORAL_CAPSULE | Freq: Every day | ORAL | Status: DC

## 2012-04-22 MED ORDER — ONDANSETRON HCL 4 MG PO TABS: 4.0000 mg | ORAL_TABLET | Freq: Four times a day (QID) | ORAL | Status: DC

## 2012-04-22 MED ORDER — ONDANSETRON HCL 4 MG/2ML IJ SOLN
4.0000 mg | Freq: Once | INTRAMUSCULAR | Status: AC
  Administered 2012-04-22: 4 mg via INTRAVENOUS
  Filled 2012-04-22: qty 2

## 2012-04-22 NOTE — ED Notes (Signed)
Pt c/o sharp lower left abd pain; starting at 6pm; denies n/v; last bm normal yesterday; no urinary symptoms

## 2012-04-22 NOTE — ED Notes (Signed)
Patient is unable to urinate at this time 

## 2012-04-22 NOTE — ED Provider Notes (Addendum)
History     CSN: [id number]  Arrival date & time 04/22/12  1850   First MD Initiated Contact with Patient 04/22/12 2006      No chief complaint on file.   (Consider location/radiation/quality/duration/timing/severity/associated sxs/prior treatment) HPI Comments: Patient presents to the emergency room 2 hours after developing a sharp, stabbing, left lower quarter, and pain without nausea, vomiting, diarrhea, dysuria, flank pain.  Denies any trauma to the area, states she's never had pain like this before.  She did not take any over-the-counter medication.  Prior to arrival.  Since arrival.  Patient states, that the pain is decreasing in intensity  The history is provided by the patient.    Past Medical History  Diagnosis Date  . ANEMIA DUE TO CHRONIC BLOOD LOSS 04/05/2008  . Edema 01/02/2008  . HYPERLIPIDEMIA 02/01/2007  . HYPERTENSION 02/01/2007  . INSOMNIA 02/01/2007  . NEUTROPENIA UNSPECIFIED 01/31/2009  . Other chronic nonalcoholic liver disease 02/07/2010  . TOBACCO USE, QUIT 02/01/2007    History reviewed. No pertinent past surgical history.  Family History  Problem Relation Age of Onset  . Cancer Mother     Lung Cancer    History  Substance Use Topics  . Smoking status: Former Games developer  . Smokeless tobacco: Not on file  . Alcohol Use: Not on file    OB History    Grav Para Term Preterm Abortions TAB SAB Ect Mult Living                  Review of Systems  Constitutional: Negative for fever and chills.  HENT: Negative for sore throat.   Respiratory: Negative for shortness of breath.   Cardiovascular: Negative for chest pain.  Gastrointestinal: Positive for abdominal pain. Negative for nausea, vomiting, diarrhea, constipation, blood in stool and rectal pain.  Genitourinary: Negative for dysuria, frequency, flank pain, vaginal bleeding, vaginal discharge and vaginal pain.  Musculoskeletal: Negative for back pain.  Skin: Negative for rash and wound.  Neurological:  Negative for weakness.    Allergies  Review of patient's allergies indicates no known allergies.  Home Medications   Current Outpatient Rx  Name  Route  Sig  Dispense  Refill  . VITAMIN C 1000 MG PO TABS   Oral   Take 1,000 mg by mouth daily.           . ASPIRIN 81 MG PO TABS   Oral   Take 81 mg by mouth daily.           . SUPER B COMPLEX PO TABS   Oral   Take 1 tablet by mouth daily.           Marland Kitchen BENAZEPRIL-HYDROCHLOROTHIAZIDE 20-12.5 MG PO TABS   Oral   Take 1 tablet by mouth daily.   90 tablet   2   . BIOTIN 1000 MCG PO TABS   Oral   Take 1,000 mcg by mouth daily.           Marland Kitchen CALCIUM 600+D PLUS MINERALS PO   Oral   Take 2 tablets by mouth daily.           Marland Kitchen CETIRIZINE HCL 10 MG PO TABS   Oral   Take 10 mg by mouth daily.           . CHOLECALCIFEROL 5000 UNITS PO TABS   Oral   Take 1 tablet by mouth daily.           . COQ10 PO   Oral  Take 1 capsule by mouth daily.           Marland Kitchen ECHINACEA PO   Oral   Take 1 capsule by mouth daily. 760mg           . MAGNESIUM 400 MG PO CAPS   Oral   Take 1 capsule by mouth daily.           Marland Kitchen ONE-DAILY MULTI VITAMINS PO TABS   Oral   Take 1 tablet by mouth daily.           . OMEGA-3 1400 MG PO CAPS   Oral   Take 1 capsule by mouth daily.           Marland Kitchen RESVERATROL 100 MG PO CAPS   Oral   Take 1 capsule by mouth daily.           Marland Kitchen SIMVASTATIN 40 MG PO TABS      One tab daily   90 tablet   2   . TRAZODONE HCL 50 MG PO TABS   Oral   Take 50 mg by mouth at bedtime as needed. For sleep          . VITAMIN B-12 1000 MCG PO TABS   Oral   Take 1,000 mcg by mouth daily.           Marland Kitchen VITAMIN E 1000 UNITS PO CAPS   Oral   Take 1,000 Units by mouth daily.           Marland Kitchen HYDROCODONE-ACETAMINOPHEN 5-325 MG PO TABS   Oral   Take 2 tablets by mouth every 4 (four) hours as needed for pain.   10 tablet   0   . ONDANSETRON HCL 4 MG PO TABS   Oral   Take 1 tablet (4 mg total) by mouth  every 6 (six) hours.   12 tablet   0   . TAMSULOSIN HCL 0.4 MG PO CAPS   Oral   Take 1 capsule (0.4 mg total) by mouth daily.   30 capsule   0     BP 152/93  Pulse 79  Temp 97.5 F (36.4 C) (Oral)  Resp 22  Ht 5\' 9"  (1.753 m)  Wt 200 lb (90.719 kg)  BMI 29.53 kg/m2  SpO2 100%  Physical Exam  Constitutional: She is oriented to person, place, and time. She appears well-developed.  HENT:  Head: Normocephalic.  Eyes: Pupils are equal, round, and reactive to light.  Abdominal: Soft. Bowel sounds are normal. She exhibits no distension.  Musculoskeletal: Normal range of motion.  Neurological: She is alert and oriented to person, place, and time.  Skin: Skin is warm. There is pallor.    ED Course  Procedures (including critical care time)  Labs Reviewed  URINALYSIS, ROUTINE W REFLEX MICROSCOPIC - Abnormal; Notable for the following:    Hgb urine dipstick LARGE (*)     All other components within normal limits  COMPREHENSIVE METABOLIC PANEL - Abnormal; Notable for the following:    AST 57 (*)     ALT 48 (*)     GFR calc non Af Amer 67 (*)     GFR calc Af Amer 78 (*)     All other components within normal limits  CBC WITH DIFFERENTIAL  URINE MICROSCOPIC-ADD ON   Ct Abdomen Pelvis Wo Contrast  04/22/2012  *RADIOLOGY REPORT*  Clinical Data: Left flank pain, hematuria  CT ABDOMEN AND PELVIS WITHOUT CONTRAST  Technique:  Multidetector CT imaging of the  abdomen and pelvis was performed following the standard protocol without intravenous contrast.  Comparison: None.  Findings: Lung bases are essentially clear.  Unenhanced liver, spleen, pancreas, and adrenal glands are within normal limits.  Gallbladder is unremarkable.  No intrahepatic or extrahepatic ductal dilatation.  Right kidney is unremarkable.  1.5 cm lateral interpolar left renal cyst (series 2/image 38).  Mild fullness of the left renal collecting system.  No evidence of bowel obstruction.  Normal appendix.   Atherosclerotic calcifications of the abdominal aorta and branch vessels.  No abdominopelvic ascites.  Small retroperitoneal lymph nodes which do not meet pathologic CT size criteria.  Uterus and bilateral ovaries are unremarkable.  Vaginal contraceptive ring.  4 mm distal left ureteral calculus (coronal image 60).  Bladder is within normal limits.  Mild degenerative changes at L5-S1.  IMPRESSION: 3 mm distal left ureteral calculus with mild fullness of the left renal collecting system.   Original Report Authenticated By: Charline Bills, M.D.      1. Kidney stone on left side       MDM   + for kidney stone will DC home with pain control and urology FU        Arman Filter, NP 04/22/12 2325  Arman Filter, NP 05/15/12 [phone]

## 2012-04-23 NOTE — ED Provider Notes (Signed)
Medical screening examination/treatment/procedure(s) were performed by non-physician practitioner and as supervising physician I was immediately available for consultation/collaboration.  Virgel Manifold, MD 04/23/12 [phone]

## 2012-04-28 ENCOUNTER — Other Ambulatory Visit: Payer: Self-pay | Admitting: Gynecology

## 2012-04-28 DIAGNOSIS — N951 Menopausal and female climacteric states: Secondary | ICD-10-CM

## 2012-05-08 ENCOUNTER — Ambulatory Visit
Admission: RE | Admit: 2012-05-08 | Discharge: 2012-05-08 | Disposition: A | Discharge status: Home / Self Care | Payer: BC Managed Care – PPO | Source: Ambulatory Visit | Attending: Gynecology | Admitting: Gynecology

## 2012-05-08 ENCOUNTER — Ambulatory Visit
Admission: RE | Admit: 2012-05-08 | Discharge: 2012-05-08 | Disposition: A | Discharge status: Home / Self Care | Payer: BC Managed Care – PPO | Source: Ambulatory Visit

## 2012-05-08 DIAGNOSIS — N951 Menopausal and female climacteric states: Secondary | ICD-10-CM

## 2012-05-17 NOTE — ED Provider Notes (Signed)
Medical screening examination/treatment/procedure(s) were performed by non-physician practitioner and as supervising physician I was immediately available for consultation/collaboration.  Virgel Manifold, MD 05/17/12 [phone]

## 2012-08-01 ENCOUNTER — Telehealth: Payer: Self-pay | Admitting: Endocrinology

## 2012-08-01 NOTE — Telephone Encounter (Signed)
Tried calling patient to schedule follow up appointment, no answer, lvmom

## 2012-10-06 ENCOUNTER — Telehealth: Payer: Self-pay | Admitting: Endocrinology

## 2013-02-05 ENCOUNTER — Other Ambulatory Visit: Payer: Self-pay

## 2013-02-05 DIAGNOSIS — Z1231 Encounter for screening mammogram for malignant neoplasm of breast: Secondary | ICD-10-CM

## 2013-02-10 ENCOUNTER — Other Ambulatory Visit: Payer: BC Managed Care – PPO

## 2013-02-16 ENCOUNTER — Other Ambulatory Visit: Payer: Self-pay

## 2013-02-16 ENCOUNTER — Other Ambulatory Visit (INDEPENDENT_AMBULATORY_CARE_PROVIDER_SITE_OTHER): Payer: BC Managed Care – PPO

## 2013-02-16 DIAGNOSIS — Z0000 Encounter for general adult medical examination without abnormal findings: Secondary | ICD-10-CM

## 2013-02-17 ENCOUNTER — Encounter: Payer: BC Managed Care – PPO | Admitting: Endocrinology

## 2013-02-17 LAB — URINALYSIS, ROUTINE W REFLEX MICROSCOPIC
RBC / HPF: NONE SEEN (ref 0–?)
Specific Gravity, Urine: >=1.03 (ref 1.000–1.030)
Total Protein, Urine: NEGATIVE
Urine Glucose: NEGATIVE
WBC, UA: NONE SEEN (ref 0–?)
pH: 6 (ref 5.0–8.0)

## 2013-02-17 LAB — HEPATIC FUNCTION PANEL
Albumin: 4 g/dL (ref 3.5–5.2)
Alkaline Phosphatase: 54 U/L (ref 39–117)
Total Protein: 7.7 g/dL (ref 6.0–8.3)

## 2013-02-17 LAB — TSH: TSH: 1.08 u[IU]/mL (ref 0.35–5.50)

## 2013-02-17 LAB — BASIC METABOLIC PANEL
Calcium: 9.1 mg/dL (ref 8.4–10.5)
Chloride: 103 mEq/L (ref 96–112)
Creatinine, Ser: 1 mg/dL (ref 0.4–1.2)
GFR: 77.28 mL/min (ref >60.00)

## 2013-02-17 LAB — CBC WITH DIFFERENTIAL/PLATELET
Basophils Relative: 1.2 % (ref 0.0–3.0)
Eosinophils Absolute: 0.2 10*3/uL (ref 0.0–0.7)
Hemoglobin: 13.1 g/dL (ref 12.0–15.0)
Lymphocytes Relative: 43 % (ref 12.0–46.0)
MCHC: 33 g/dL (ref 30.0–36.0)
MCV: 86.6 fl (ref 78.0–100.0)
Neutro Abs: 1.4 10*3/uL (ref 1.4–7.7)
RBC: 4.57 Mil/uL (ref 3.87–5.11)

## 2013-02-17 LAB — LIPID PANEL: Cholesterol: 149 mg/dL (ref 0–200)

## 2013-02-20 ENCOUNTER — Ambulatory Visit (INDEPENDENT_AMBULATORY_CARE_PROVIDER_SITE_OTHER): Payer: BC Managed Care – PPO | Admitting: Endocrinology

## 2013-02-20 ENCOUNTER — Encounter: Payer: Self-pay | Admitting: Endocrinology

## 2013-02-20 VITALS — BP 158/90 | HR 79 | Ht 68.0 in | Wt <= 1120 oz

## 2013-02-20 DIAGNOSIS — R7309 Other abnormal glucose: Secondary | ICD-10-CM

## 2013-02-20 DIAGNOSIS — Z0000 Encounter for general adult medical examination without abnormal findings: Secondary | ICD-10-CM

## 2013-02-20 DIAGNOSIS — Z23 Encounter for immunization: Secondary | ICD-10-CM

## 2013-02-20 DIAGNOSIS — R739 Hyperglycemia, unspecified: Secondary | ICD-10-CM | POA: Insufficient documentation

## 2013-02-20 DIAGNOSIS — K7689 Other specified diseases of liver: Secondary | ICD-10-CM

## 2013-02-20 NOTE — Patient Instructions (Addendum)
Please come back for a blood test in january.  Losing weight helps the blood sugar and liver.   please consider these measures for your health:  minimize alcohol.  do not use tobacco products.  have a colonoscopy at least every 10 years from age 57.  Women should have an annual mammogram from age 57.  keep firearms safely stored.  always use seat belts.  have working smoke alarms in your home.  see an eye doctor and dentist regularly.  never drive under the influence of alcohol or drugs (including prescription drugs).   Please return in 1 year.

## 2013-02-20 NOTE — Progress Notes (Signed)
Subjective:    Patient ID: Tonya Taylor, female    DOB: August 03, 1955, 57 y.o.   MRN: [id number]  HPI Pt is here for regular wellness examination, and is feeling pretty well in general, and says chronic med probs are stable, except as noted below Past Medical History  Diagnosis Date  . ANEMIA DUE TO CHRONIC BLOOD LOSS 04/05/2008  . Edema 01/02/2008  . HYPERLIPIDEMIA 02/01/2007  . HYPERTENSION 02/01/2007  . INSOMNIA 02/01/2007  . NEUTROPENIA UNSPECIFIED 01/31/2009  . Other chronic nonalcoholic liver disease 02/07/2010  . TOBACCO USE, QUIT 02/01/2007    No past surgical history on file.  History   Social History  . Marital Status: Married    Spouse Name: N/A    Number of Children: N/A  . Years of Education: N/A   Occupational History  . Works for school system    Social History Main Topics  . Smoking status: Former Games developer  . Smokeless tobacco: Not on file  . Alcohol Use: Not on file  . Drug Use: No  . Sexual Activity: Not on file   Other Topics Concern  . Not on file   Social History Narrative  . No narrative on file    Current Outpatient Prescriptions on File Prior to Visit  Medication Sig Dispense Refill  . Ascorbic Acid (VITAMIN C) 1000 MG tablet Take 1,000 mg by mouth daily.        Marland Kitchen aspirin 81 MG tablet Take 81 mg by mouth daily.        . B Complex-C (SUPER B COMPLEX) TABS Take 1 tablet by mouth daily.        . benazepril-hydrochlorthiazide (LOTENSIN HCT) 20-12.5 MG per tablet Take 1 tablet by mouth daily.  90 tablet  2  . Biotin 1000 MCG tablet Take 1,000 mcg by mouth daily.        . Calcium Carbonate-Vit D-Min (CALCIUM 600+D PLUS MINERALS PO) Take 2 tablets by mouth daily.        . cetirizine (ZYRTEC) 10 MG tablet Take 10 mg by mouth daily.        . Cholecalciferol (D 5000) 5000 UNITS TABS Take 1 tablet by mouth daily.        . Coenzyme Q10 (COQ10 PO) Take 1 capsule by mouth daily.        Marland Kitchen ECHINACEA PO Take 1 capsule by mouth daily. 760mg        .  HYDROcodone-acetaminophen (NORCO/VICODIN) 5-325 MG per tablet Take 2 tablets by mouth every 4 (four) hours as needed for pain.  10 tablet  0  . Magnesium 400 MG CAPS Take 1 capsule by mouth daily.        . Multiple Vitamin (MULTIVITAMIN) tablet Take 1 tablet by mouth daily.        . Omega-3 1400 MG CAPS Take 1 capsule by mouth daily.        . ondansetron (ZOFRAN) 4 MG tablet Take 1 tablet (4 mg total) by mouth every 6 (six) hours.  12 tablet  0  . RESVERATROL 100 MG CAPS Take 1 capsule by mouth daily.        . simvastatin (ZOCOR) 40 MG tablet One tab daily  90 tablet  2  . Tamsulosin HCl (FLOMAX) 0.4 MG CAPS Take 1 capsule (0.4 mg total) by mouth daily.  30 capsule  0  . traZODone (DESYREL) 50 MG tablet Take 50 mg by mouth at bedtime as needed. For sleep       .  vitamin B-12 (CYANOCOBALAMIN) 1000 MCG tablet Take 1,000 mcg by mouth daily.        . vitamin E 1000 UNIT capsule Take 1,000 Units by mouth daily.         No current facility-administered medications on file prior to visit.    No Known Allergies  Family History  Problem Relation Age of Onset  . Cancer Mother     Lung Cancer    BP 158/90  Pulse 79  Ht 5\' 8"  (1.727 m)  Wt 22 lb (9.979 kg)  BMI 3.35 kg/m2  SpO2 96%     Review of Systems  Constitutional: Negative for fever.  HENT: Negative for hearing loss.   Eyes: Negative for visual disturbance.  Respiratory: Negative for shortness of breath.   Gastrointestinal: Negative for anal bleeding.  Endocrine: Negative for cold intolerance.  Genitourinary: Negative for hematuria.  Musculoskeletal: Negative for back pain.  Skin: Negative for rash.  Allergic/Immunologic: Negative for environmental allergies.  Neurological: Negative for syncope.  Hematological: Does not bruise/bleed easily.  Psychiatric/Behavioral: Negative for dysphoric mood.       Objective:   Physical Exam VS: see vs page GEN: no distress HEAD: head: no deformity eyes: no periorbital swelling, no  proptosis external nose and ears are normal mouth: no lesion seen NECK: supple, thyroid is not enlarged CHEST WALL: no deformity LUNGS:  Clear to auscultation BREASTS:  sees gyn CV: reg rate and rhythm, no murmur GENITALIA/RECTAL: sees gyn MUSCULOSKELETAL: muscle bulk and strength are grossly normal.  no obvious joint swelling.  gait is normal and steady EXTEMITIES: no deformity.  no ulcer on the feet.  feet are of normal color and temp.  no edema PULSES: dorsalis pedis intact bilat.  no carotid bruit NEURO:  cn 2-12 grossly intact.   readily moves all 4's.  sensation is intact to touch on the feet SKIN:  Normal texture and temperature.  No rash or suspicious lesion is visible.   NODES:  None palpable at the neck PSYCH: alert, oriented x3.  Does not appear anxious nor depressed.  i reviewed electrocardiogram    Assessment & Plan:  Wellness visit today, with problems stable, except as noted. we discussed code status.  pt requests full code, but would not want to be started or maintained on artificial life-support measures if there was not a reasonable chance of recovery    SEPARATE EVALUATION FOLLOWS--EACH PROBLEM HERE IS NEW, NOT RESPONDING TO TREATMENT, OR POSES SIGNIFICANT RISK TO THE PATIENT'S HEALTH: HISTORY OF THE PRESENT ILLNESS: Hyperglycemia is noted on recent labs.  Denies chest pain elev hepatic transaminases are again noted.  He denies weight change PAST MEDICAL HISTORY reviewed and up to date today REVIEW OF SYSTEMS: Denies numbness PHYSICAL EXAMINATION: VITAL SIGNS:  See vs page GENERAL: no distress ABDOMEN: abdomen is soft, nontender.  no hepatosplenomegaly.  not distended.  no hernia LAB/XRAY RESULTS: Lab Results  Component Value Date   WBC 3.6* 02/16/2013   HGB 13.1 02/16/2013   HCT 39.6 02/16/2013   PLT 229.0 02/16/2013   GLUCOSE 123* 02/16/2013   CHOL 149 02/16/2013   TRIG 68.0 02/16/2013   HDL 54.40 02/16/2013   LDLCALC 81 02/16/2013   ALT 42*  02/16/2013   AST 42* 02/16/2013   NA 140 02/16/2013   K 4.2 02/16/2013   CL 103 02/16/2013   CREATININE 1.0 02/16/2013   BUN 13 02/16/2013   CO2 28 02/16/2013   TSH 1.08 02/16/2013  IMPRESSION: Hyperglycemia, new NASH, persistent PLAN: See  instruction page.  Pt declines to take actos now

## 2013-02-25 ENCOUNTER — Ambulatory Visit
Admission: RE | Admit: 2013-02-25 | Discharge: 2013-02-25 | Disposition: A | Discharge status: Home / Self Care | Payer: BC Managed Care – PPO | Source: Ambulatory Visit

## 2013-02-25 DIAGNOSIS — Z1231 Encounter for screening mammogram for malignant neoplasm of breast: Secondary | ICD-10-CM

## 2013-05-02 ENCOUNTER — Other Ambulatory Visit: Payer: Self-pay | Admitting: Endocrinology

## 2013-05-04 ENCOUNTER — Ambulatory Visit (INDEPENDENT_AMBULATORY_CARE_PROVIDER_SITE_OTHER): Payer: BC Managed Care – PPO | Admitting: Internal Medicine

## 2013-05-04 ENCOUNTER — Ambulatory Visit: Payer: BC Managed Care – PPO

## 2013-05-04 VITALS — BP 174/110 | HR 52 | Temp 97.9°F | Resp 18 | Wt 229.0 lb

## 2013-05-04 DIAGNOSIS — R609 Edema, unspecified: Secondary | ICD-10-CM

## 2013-05-04 DIAGNOSIS — M25572 Pain in left ankle and joints of left foot: Secondary | ICD-10-CM

## 2013-05-04 DIAGNOSIS — R230 Cyanosis: Secondary | ICD-10-CM

## 2013-05-04 DIAGNOSIS — I10 Essential (primary) hypertension: Secondary | ICD-10-CM

## 2013-05-04 DIAGNOSIS — M25579 Pain in unspecified ankle and joints of unspecified foot: Secondary | ICD-10-CM

## 2013-05-04 MED ORDER — BENAZEPRIL-HYDROCHLOROTHIAZIDE 20-12.5 MG PO TABS: ORAL_TABLET | ORAL | Status: DC

## 2013-05-04 MED ORDER — TRAMADOL HCL 50 MG PO TABS: 50.0000 mg | ORAL_TABLET | Freq: Three times a day (TID) | ORAL | Status: DC | PRN

## 2013-05-04 MED ORDER — KETOROLAC TROMETHAMINE 60 MG/2ML IM SOLN
60.0000 mg | Freq: Once | INTRAMUSCULAR | Status: AC
  Administered 2013-05-04: 60 mg via INTRAMUSCULAR

## 2013-05-04 NOTE — Patient Instructions (Signed)
Increase your bp med to 2 tabs in the am for 3 days then re eval of your bp nad swelling in the office. Ultram as directed for pain. Elevate extremities. Low salt diet. Avoid aleve. Wear support hose.

## 2013-05-04 NOTE — Progress Notes (Signed)
   Subjective:    Patient ID: Tonya Taylor, female    DOB: 04-05-1956, 57 y.o.   MRN: [id number]  HPI  Swelling to both feet since yesterday. Traveled for 5 hours in car after visiting family for the xmas holiday. Increased sat in diet. Also was walking a lot. Has pain in addition to the swelling in the left foot.Pain in 6/10 increased when bearing weight and walking, no radiation of hte pain. Had taken aleve for the pain for the past 2 days. Has had a stress fracture in that foot previously. No recent injury. No sob no cp no  Change in medications. Review of Systems  Respiratory: Negative.   Cardiovascular: Negative.   Musculoskeletal:       Foot pain  Skin: Negative.   All other systems reviewed and are negative.       Objective:   Physical Exam  Nursing note and vitals reviewed. Constitutional: She appears well-developed and well-nourished.  HENT:  Head: Normocephalic and atraumatic.  Nose: Nose normal.  Mouth/Throat: Oropharynx is clear and moist.  Eyes: Conjunctivae and EOM are normal. Pupils are equal, round, and reactive to light.  Neck: Normal range of motion. Neck supple.  Cardiovascular: Normal rate, regular rhythm, normal heart sounds and intact distal pulses.   Pulmonary/Chest: Effort normal and breath sounds normal.  Abdominal: Soft. Bowel sounds are normal.  Musculoskeletal: She exhibits edema and tenderness.  Tenderness to the 5th and 4th metatarsal.  Skin: Skin is warm. There is erythema.  Left foot  Psychiatric: She has a normal mood and affect. Her behavior is normal. Judgment and thought content normal.    bp is high 174/110 pt is in pain. Will give an IM injection of toradol 60 mg for pain.  [facility] reading (PRIMARY) by  Dr. Mindi Junker negitive for fracture..      Assessment & Plan:  Swelling bilaterally consistent with peripheral edema.Teds support hose elevation, low salt diet.increase bp meds to 2 tabs in am for 3 days then recheck bp and swelling.  Ultram for pain as directed.

## 2013-05-05 ENCOUNTER — Other Ambulatory Visit: Payer: Self-pay | Admitting: Radiology

## 2013-05-05 NOTE — Telephone Encounter (Signed)
Tramadol called in for patient to [name] for her Tonya Taylor

## 2013-05-26 ENCOUNTER — Other Ambulatory Visit (HOSPITAL_COMMUNITY): Payer: Self-pay | Admitting: Orthopedic Surgery

## 2013-05-26 DIAGNOSIS — M899 Disorder of bone, unspecified: Secondary | ICD-10-CM

## 2013-05-26 DIAGNOSIS — M949 Disorder of cartilage, unspecified: Principal | ICD-10-CM

## 2013-06-17 ENCOUNTER — Ambulatory Visit (HOSPITAL_COMMUNITY)
Admission: RE | Admit: 2013-06-17 | Discharge: 2013-06-17 | Disposition: A | Discharge status: Home / Self Care | Payer: BC Managed Care – PPO | Source: Ambulatory Visit | Attending: Orthopedic Surgery | Admitting: Orthopedic Surgery

## 2013-06-17 DIAGNOSIS — M899 Disorder of bone, unspecified: Secondary | ICD-10-CM

## 2013-06-17 DIAGNOSIS — M949 Disorder of cartilage, unspecified: Principal | ICD-10-CM

## 2013-10-08 ENCOUNTER — Other Ambulatory Visit: Payer: Self-pay | Admitting: Endocrinology

## 2014-01-06 ENCOUNTER — Other Ambulatory Visit: Payer: Self-pay | Admitting: Endocrinology

## 2014-03-01 ENCOUNTER — Encounter: Payer: Self-pay | Admitting: Endocrinology

## 2014-03-01 ENCOUNTER — Ambulatory Visit (INDEPENDENT_AMBULATORY_CARE_PROVIDER_SITE_OTHER): Payer: BC Managed Care – PPO | Admitting: Endocrinology

## 2014-03-01 VITALS — BP 136/90 | HR 72 | Temp 98.5°F | Ht 68.0 in | Wt 217.0 lb

## 2014-03-01 DIAGNOSIS — R739 Hyperglycemia, unspecified: Secondary | ICD-10-CM

## 2014-03-01 DIAGNOSIS — D500 Iron deficiency anemia secondary to blood loss (chronic): Secondary | ICD-10-CM | POA: Diagnosis not present

## 2014-03-01 DIAGNOSIS — Z0000 Encounter for general adult medical examination without abnormal findings: Secondary | ICD-10-CM | POA: Insufficient documentation

## 2014-03-01 DIAGNOSIS — E785 Hyperlipidemia, unspecified: Secondary | ICD-10-CM | POA: Diagnosis not present

## 2014-03-01 DIAGNOSIS — K7581 Nonalcoholic steatohepatitis (NASH): Secondary | ICD-10-CM | POA: Diagnosis not present

## 2014-03-01 DIAGNOSIS — Z23 Encounter for immunization: Secondary | ICD-10-CM

## 2014-03-01 DIAGNOSIS — I10 Essential (primary) hypertension: Secondary | ICD-10-CM

## 2014-03-01 LAB — URINALYSIS, ROUTINE W REFLEX MICROSCOPIC
Bilirubin Urine: NEGATIVE
HGB URINE DIPSTICK: NEGATIVE
Ketones, ur: NEGATIVE
Leukocytes, UA: NEGATIVE
NITRITE: NEGATIVE
PH: 7 (ref 5.0–8.0)
RBC / HPF: NONE SEEN (ref 0–?)
Specific Gravity, Urine: 1.02 (ref 1.000–1.030)
TOTAL PROTEIN, URINE-UPE24: NEGATIVE
URINE GLUCOSE: NEGATIVE
Urobilinogen, UA: 0.2 (ref 0.0–1.0)

## 2014-03-01 LAB — BASIC METABOLIC PANEL
BUN: 13 mg/dL (ref 6–23)
CO2: 29 meq/L (ref 19–32)
Calcium: 9.1 mg/dL (ref 8.4–10.5)
Chloride: 104 mEq/L (ref 96–112)
Creatinine, Ser: 0.9 mg/dL (ref 0.4–1.2)
GFR: 79.86 mL/min (ref >60.00)
GLUCOSE: 87 mg/dL (ref 70–99)
POTASSIUM: 3.8 meq/L (ref 3.5–5.1)
Sodium: 139 mEq/L (ref 135–145)

## 2014-03-01 LAB — CBC WITH DIFFERENTIAL/PLATELET
Basophils Absolute: 0 10*3/uL (ref 0.0–0.1)
Basophils Relative: 1.1 % (ref 0.0–3.0)
Eosinophils Absolute: 0.3 10*3/uL (ref 0.0–0.7)
Eosinophils Relative: 9.9 % — ABNORMAL HIGH (ref 0.0–5.0)
HEMATOCRIT: 41 % (ref 36.0–46.0)
HEMOGLOBIN: 13.5 g/dL (ref 12.0–15.0)
LYMPHS ABS: 1.2 10*3/uL (ref 0.7–4.0)
Lymphocytes Relative: 37.9 % (ref 12.0–46.0)
MCHC: 32.9 g/dL (ref 30.0–36.0)
MCV: 86.6 fl (ref 78.0–100.0)
MONOS PCT: 13.5 % — AB (ref 3.0–12.0)
Monocytes Absolute: 0.4 10*3/uL (ref 0.1–1.0)
NEUTROS ABS: 1.2 10*3/uL — AB (ref 1.4–7.7)
Neutrophils Relative %: 37.6 % — ABNORMAL LOW (ref 43.0–77.0)
PLATELETS: 211 10*3/uL (ref 150.0–400.0)
RBC: 4.74 Mil/uL (ref 3.87–5.11)
RDW: 13.5 % (ref 11.5–15.5)
WBC: 3.1 10*3/uL — AB (ref 4.0–10.5)

## 2014-03-01 LAB — HEPATIC FUNCTION PANEL
ALK PHOS: 57 U/L (ref 39–117)
ALT: 34 U/L (ref 0–35)
AST: 36 U/L (ref 0–37)
Albumin: 3.5 g/dL (ref 3.5–5.2)
BILIRUBIN DIRECT: 0 mg/dL (ref 0.0–0.3)
Total Bilirubin: 0.5 mg/dL (ref 0.2–1.2)
Total Protein: 7.7 g/dL (ref 6.0–8.3)

## 2014-03-01 LAB — LIPID PANEL
CHOLESTEROL: 151 mg/dL (ref 0–200)
HDL: 50 mg/dL (ref >39.00)
LDL Cholesterol: 85 mg/dL (ref 0–99)
NonHDL: 101
Total CHOL/HDL Ratio: 3
Triglycerides: 82 mg/dL (ref 0.0–149.0)
VLDL: 16.4 mg/dL (ref 0.0–40.0)

## 2014-03-01 LAB — HEMOGLOBIN A1C: Hgb A1c MFr Bld: 5.6 % (ref 4.6–6.5)

## 2014-03-01 LAB — IBC PANEL
Iron: 93 ug/dL (ref 42–145)
SATURATION RATIOS: 26.6 % (ref 20.0–50.0)
Transferrin: 249.7 mg/dL (ref 212.0–360.0)

## 2014-03-01 LAB — TSH: TSH: 1.18 u[IU]/mL (ref 0.35–4.50)

## 2014-03-01 NOTE — Patient Instructions (Addendum)
please consider these measures for your health:  minimize alcohol.  do not use tobacco products.  have a colonoscopy at least every 10 years from age 58.  Women should have an annual mammogram from age 87.  keep firearms safely stored.  always use seat belts.  have working smoke alarms in your home.  see an eye doctor and dentist regularly.  never drive under the influence of alcohol or drugs (including prescription drugs).   Blood and urine tests are being requested for you today.  We'll contact you with results.   Please return in 1 year.

## 2014-03-01 NOTE — Progress Notes (Signed)
Subjective:    Patient ID: Tonya Taylor, female    DOB: [birth year], 57 y.o.   MRN: [id number]  HPI Pt is here for regular wellness examination, and is feeling pretty well in general, and says chronic med probs are stable, except as noted below Past Medical History  Diagnosis Date  . ANEMIA DUE TO CHRONIC BLOOD LOSS 04/05/2008  . Edema 01/02/2008  . HYPERLIPIDEMIA 02/01/2007  . HYPERTENSION 02/01/2007  . INSOMNIA 02/01/2007  . NEUTROPENIA UNSPECIFIED 01/31/2009  . Other chronic nonalcoholic liver disease 64/08/345  . TOBACCO USE, QUIT 02/01/2007    No past surgical history on file.  History   Social History  . Marital Status: Married    Spouse Name: N/A    Number of Children: N/A  . Years of Education: N/A   Occupational History  . Works for school system    Social History Main Topics  . Smoking status: Former Research scientist (life sciences)  . Smokeless tobacco: Not on file  . Alcohol Use: Not on file  . Drug Use: No  . Sexual Activity: Not on file   Other Topics Concern  . Not on file   Social History Narrative  . No narrative on file    Current Outpatient Prescriptions on File Prior to Visit  Medication Sig Dispense Refill  . Ascorbic Acid (VITAMIN C) 1000 MG tablet Take 1,000 mg by mouth daily.        Marland Kitchen aspirin 81 MG tablet Take 81 mg by mouth daily.        . B Complex-C (SUPER B COMPLEX) TABS Take 1 tablet by mouth daily.        . benazepril-hydrochlorthiazide (LOTENSIN HCT) 20-12.5 MG per tablet TAKE 1 TABLET DAILY  90 tablet  1  . Biotin 1000 MCG tablet Take 1,000 mcg by mouth daily.        . Calcium Carbonate-Vit D-Min (CALCIUM 600+D PLUS MINERALS PO) Take 2 tablets by mouth daily.        . cetirizine (ZYRTEC) 10 MG tablet Take 10 mg by mouth daily.        . Cholecalciferol (D 5000) 5000 UNITS TABS Take 1 tablet by mouth daily.        . Coenzyme Q10 (COQ10 PO) Take 1 capsule by mouth daily.        Marland Kitchen ECHINACEA PO Take 1 capsule by mouth daily. 760mg        .  HYDROcodone-acetaminophen (NORCO/VICODIN) 5-325 MG per tablet Take 2 tablets by mouth every 4 (four) hours as needed for pain.  10 tablet  0  . Magnesium 400 MG CAPS Take 1 capsule by mouth daily.        . Multiple Vitamin (MULTIVITAMIN) tablet Take 1 tablet by mouth daily.        . Omega-3 1400 MG CAPS Take 1 capsule by mouth daily.        . ondansetron (ZOFRAN) 4 MG tablet Take 1 tablet (4 mg total) by mouth every 6 (six) hours.  12 tablet  0  . RESVERATROL 100 MG CAPS Take 1 capsule by mouth daily.        . simvastatin (ZOCOR) 40 MG tablet TAKE 1 TABLET DAILY  90 tablet  0  . traMADol (ULTRAM) 50 MG tablet Take 1 tablet (50 mg total) by mouth every 8 (eight) hours as needed.  30 tablet  0  . vitamin B-12 (CYANOCOBALAMIN) 1000 MCG tablet Take 1,000 mcg by mouth daily.        Marland Kitchen  vitamin E 1000 UNIT capsule Take 1,000 Units by mouth daily.         No current facility-administered medications on file prior to visit.    No Known Allergies  Family History  Problem Relation Age of Onset  . Cancer Mother     Lung Cancer    BP 136/90  Pulse 72  Temp(Src) 98.5 F (36.9 C) (Oral)  Ht 5\' 8"  (1.727 m)  Wt 217 lb (98.431 kg)  BMI 33.00 kg/m2  SpO2 95%     Review of Systems  Constitutional: Negative for fever and unexpected weight change.  HENT: Negative for hearing loss.   Eyes: Negative for visual disturbance.  Respiratory: Negative for shortness of breath.   Cardiovascular: Negative for chest pain.  Gastrointestinal: Negative for anal bleeding.  Endocrine: Negative for cold intolerance.  Genitourinary: Negative for hematuria.  Musculoskeletal: Negative for back pain.  Skin: Negative for rash.  Allergic/Immunologic: Positive for environmental allergies.  Neurological: Negative for syncope and numbness.  Hematological: Does not bruise/bleed easily.  Psychiatric/Behavioral: Negative for dysphoric mood.       Objective:   Physical Exam VS: see vs page GEN: no distress HEAD:  head: no deformity eyes: no periorbital swelling, no proptosis external nose and ears are normal mouth: no lesion seen NECK: supple, thyroid is not enlarged CHEST WALL: no deformity LUNGS:  Clear to auscultation BREASTS: sees gyn CV: reg rate and rhythm, no murmur ABD: abdomen is soft, nontender.  no hepatosplenomegaly.  not distended.  no hernia.   GENITALIA/RECTAL: sees gyn MUSCULOSKELETAL: muscle bulk and strength are grossly normal.  no obvious joint swelling.  gait is normal and steady EXTEMITIES: no deformity.  no ulcer on the feet.  feet are of normal color and temp.  1+ bilat leg edema.   PULSES: dorsalis pedis intact bilat.  no carotid bruit.  NEURO:  cn 2-12 grossly intact.   readily moves all 4's.  sensation is intact to touch on the feet SKIN:  Normal texture and temperature.  No rash or suspicious lesion is visible.   NODES:  None palpable at the neck PSYCH: alert, well-oriented.  Does not appear anxious nor depressed.   i reviewed electrocardiogram      Assessment & Plan:  Wellness visit today, with problems stable, except as noted. Patient is advised the following: Patient Instructions  please consider these measures for your health:  minimize alcohol.  do not use tobacco products.  have a colonoscopy at least every 10 years from age 88.  Women should have an annual mammogram from age 73.  keep firearms safely stored.  always use seat belts.  have working smoke alarms in your home.  see an eye doctor and dentist regularly.  never drive under the influence of alcohol or drugs (including prescription drugs).   Blood and urine tests are being requested for you today.  We'll contact you with results.   Please return in 1 year.

## 2014-03-17 ENCOUNTER — Other Ambulatory Visit: Payer: Self-pay

## 2014-03-17 DIAGNOSIS — Z1231 Encounter for screening mammogram for malignant neoplasm of breast: Secondary | ICD-10-CM

## 2014-04-05 ENCOUNTER — Other Ambulatory Visit: Payer: Self-pay | Admitting: Endocrinology

## 2014-04-05 NOTE — Telephone Encounter (Signed)
Please advise if ok to refill. Medication is listed under a different provider.  

## 2014-04-06 ENCOUNTER — Ambulatory Visit
Admission: RE | Admit: 2014-04-06 | Discharge: 2014-04-06 | Disposition: A | Discharge status: Home / Self Care | Payer: BC Managed Care – PPO | Source: Ambulatory Visit

## 2014-04-06 DIAGNOSIS — Z1231 Encounter for screening mammogram for malignant neoplasm of breast: Secondary | ICD-10-CM

## 2014-04-07 ENCOUNTER — Ambulatory Visit (INDEPENDENT_AMBULATORY_CARE_PROVIDER_SITE_OTHER): Payer: BC Managed Care – PPO | Admitting: Family Medicine

## 2014-04-07 VITALS — BP 174/100 | HR 78 | Temp 97.6°F | Resp 18 | Ht 68.0 in | Wt 215.0 lb

## 2014-04-07 DIAGNOSIS — T310 Burns involving less than 10% of body surface: Secondary | ICD-10-CM

## 2014-04-07 MED ORDER — CEPHALEXIN 500 MG PO CAPS: 500.0000 mg | ORAL_CAPSULE | Freq: Four times a day (QID) | ORAL | Status: DC

## 2014-04-07 MED ORDER — SILVER SULFADIAZINE 1 % EX CREA: 1.0000 "application " | TOPICAL_CREAM | Freq: Every day | CUTANEOUS | Status: DC

## 2014-04-07 MED ORDER — OXYCODONE-ACETAMINOPHEN 5-325 MG PO TABS
1.0000 | ORAL_TABLET | ORAL | Status: DC | PRN
Start: 2014-04-07 — End: 2014-04-17
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
## 2014-04-07 NOTE — Patient Instructions (Signed)
Burn Care °Your skin is a natural barrier to infection. It is the largest organ of your body. Burns damage this natural protection. To help prevent infection, it is very important to follow your caregiver's instructions in the care of your burn. °Burns are classified as: °· First degree. There is only redness of the skin (erythema). No scarring is expected. °· Second degree. There is blistering of the skin. Scarring may occur with deeper burns. °· Third degree. All layers of the skin are injured, and scarring is expected. °HOME CARE INSTRUCTIONS  °· Wash your hands well before changing your bandage. °· Change your bandage as often as directed by your caregiver. °· Remove the old bandage. If the bandage sticks, you may soak it off with cool, clean water. °· Cleanse the burn thoroughly but gently with mild soap and water. °· Pat the area dry with a clean, dry cloth. °· Apply a thin layer of antibacterial cream to the burn. °· Apply a clean bandage as instructed by your caregiver. °· Keep the bandage as clean and dry as possible. °· Elevate the affected area for the first 24 hours, then as instructed by your caregiver. °· Only take over-the-counter or prescription medicines for pain, discomfort, or fever as directed by your caregiver. °SEEK IMMEDIATE MEDICAL CARE IF:  °· You develop excessive pain. °· You develop redness, tenderness, swelling, or red streaks near the burn. °· The burned area develops yellowish-white fluid (pus) or a bad smell. °· You have a fever. °MAKE SURE YOU:  °· Understand these instructions. °· Will watch your condition. °· Will get help right away if you are not doing well or get worse. °Document Released: 04/23/2005 Document Revised: 07/16/2011 Document Reviewed: 09/13/2010 °ExitCare® Patient Information ©2015 [employer]. This information is not intended to replace advice given to you by your health care provider. Make sure you discuss any questions you have with your health care  provider. ° °Second-Degree Burn °A second-degree burn affects the 2 outer layers of skin. The outer layer (epidermis) and the layer underneath it (dermis) are both burned. Another name for this type of burn is a partial thickness burn. A second-degree burn may be called minor or major. This depends on the size of the burn. It also depends on what parts of the skin are burned. Minor burns may be treated with first aid. Major burns are a medical emergency. °A second-degree burn is worse than a first-degree burn, but not as bad as a third-degree burn. A first-degree burn affects only the epidermis. A third-degree burn goes through all the layers of skin. A second-degree burn usually heals in 3 to 4 weeks. A minor second-degree burn usually does not leave a scar. Deeper second-degree burns may lead to scarring of the skin or contractures over joints. Contractures are scars that form over joints and may lead to reduced mobility at those joints. °CAUSES °· Heat (thermal) injury. This happens when skin comes in contact with something very hot. It could be a flame, a hot object, hot liquid, or steam. Most second-degree burns are thermal injuries. °· Radiation. Sunlight is one type of radiation that can burn the skin. Another type of radiation is used to heat food. Radiation is also used to treat some diseases, such as cancer. All types of radiation can burn the skin. Sunlight usually causes a first-degree burn. Radiation used for heating food or treating a disease can cause a second-degree burn. °· Electricity. Electrical burns can cause more damage under the skin than   on the surface. They should always be treated as major burns. °· Chemicals. Many chemicals can burn the skin. The burn should be flushed with cool water and checked by an emergency caregiver. °SYMPTOMS °Symptoms of second-degree burns include: °· Severe pain. °· Extreme tenderness. °· Deep redness. °· Blistered skin. °· Skin that has changed color. It might  look blotchy, wet, or shiny. °· Swelling. °TREATMENT °Some second-degree burns may need to be treated in a hospital. These include major burns, electrical burns, and chemical burns. Many other second-degree burns can be treated with regular first aid, such as: °· Cooling the burn. Use cool, germ-free (sterile) salt water. Place the burned area of skin into a tub of water, or cover the burned area with clean, wet towels. °· Taking pain medicine. °· Removing the dead skin from broken blisters. A trained caregiver may do this. Do not pop blisters. °· Gently washing your skin with mild soap. °· Covering the burned area with a cream. Silver sulfadiazine is a cream for burns. An antibiotic cream, such as bacitracin, may also be used to fight infection. Do not use other ointments or creams unless your caregiver says it is okay. °· Protecting the burn with a sterile, non-sticky bandage. °· Bandaging fingers and toes separately. This keeps them from sticking together. °· Taking an antibiotic. This can help prevent infection. °· Getting a tetanus shot. °HOME CARE INSTRUCTIONS °Medication °· Take any medicine prescribed by your caregiver. Follow the directions carefully. °· Ask your caregiver if you can take over-the-counter medicine to relieve pain and swelling. Do not give aspirin to children. °· Make sure your caregiver knows about all other medicines you take. This includes over-the-counter medicines. °Burn care °· You will need to change the bandage on your burn. You may need to do this 2 or 3 times each day. °¨ Gently clean the burned area. °¨ Put ointment on it. °¨ Cover the burn with a sterile bandage. °· For some deeper burns or burns that cover a large area, compression garments may be prescribed. These garments can help minimize scarring and protect your mobility. °· Do not put butter or oil on your skin. Use only the cream prescribed by your caregiver. °· Do not put ice on your burn. °· Do not break blisters on  your skin. °· Keep the bandaged area dry. You might need to take a sponge bath for awhile. Ask your caregiver when you can take a shower or a tub bath again. °· Do not scratch an itchy burn. Your caregiver may give you medicine to relieve very bad itching. °· Infection is a big danger after a second-degree burn. Tell your caregiver right away if you have signs of infection, such as: °¨ Redness or changing color in the burned area. °¨ Fluid leaking from the burn. °¨ Swelling in the burn area. °¨ A bad smell coming from the wound. °Follow-up °· Keep all follow-up appointments. This is important. This is how your caregiver can tell if your treatment is working. °· Protect your burn from sunlight. Use sunscreen whenever you go outside. Burned areas may be sensitive to the sun for up to 1 year. Exposure to the sun may also cause permanent darkening of scars. °SEEK MEDICAL CARE IF: °· You have any questions about medicines. °· You have any questions about your treatment. °· You wonder if it is okay to do a particular activity. °· You develop a fever of more than 100.5° F (38.1° C). °SEEK IMMEDIATE MEDICAL CARE IF: °· You think your burn   might be infected. It may change color, become red, leak fluid, swell, or smell bad. °· You develop a fever of more than 102° F (38.9° C). °Document Released: 09/25/2010 Document Revised: 07/16/2011 Document Reviewed: 09/25/2010 °ExitCare® Patient Information ©2015 [employer]. This information is not intended to replace advice given to you by your health care provider. Make sure you discuss any questions you have with your health care provider. ° °

## 2014-04-10 ENCOUNTER — Ambulatory Visit (INDEPENDENT_AMBULATORY_CARE_PROVIDER_SITE_OTHER): Payer: BC Managed Care – PPO | Admitting: Family Medicine

## 2014-04-10 VITALS — BP 130/82 | HR 72 | Temp 98.1°F | Resp 19 | Ht 67.5 in | Wt 213.0 lb

## 2014-04-10 DIAGNOSIS — T310 Burns involving less than 10% of body surface: Secondary | ICD-10-CM
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
## 2014-04-17 ENCOUNTER — Ambulatory Visit (INDEPENDENT_AMBULATORY_CARE_PROVIDER_SITE_OTHER): Payer: BC Managed Care – PPO | Admitting: Family Medicine

## 2014-04-17 VITALS — BP 134/78 | HR 69 | Temp 98.0°F | Resp 18 | Ht 67.0 in | Wt 214.0 lb

## 2014-04-17 DIAGNOSIS — T310 Burns involving less than 10% of body surface: Secondary | ICD-10-CM

## 2014-04-17 NOTE — Patient Instructions (Signed)
Second-Degree Burn  A second-degree burn affects the 2 outer layers of skin. The outer layer (epidermis) and the layer underneath it (dermis) are both burned. Another name for this type of burn is a partial thickness burn. A second-degree burn may be called minor or major. This depends on the size of the burn. It also depends on what parts of the skin are burned. Minor burns may be treated with first aid. Major burns are a medical emergency.  A second-degree burn is worse than a first-degree burn, but not as bad as a third-degree burn. A first-degree burn affects only the epidermis. A third-degree burn goes through all the layers of skin. A second-degree burn usually heals in 3 to 4 weeks. A minor second-degree burn usually does not leave a scar. Deeper second-degree burns may lead to scarring of the skin or contractures over joints. Contractures are scars that form over joints and may lead to reduced mobility at those joints.  CAUSES  · Heat (thermal) injury. This happens when skin comes in contact with something very hot. It could be a flame, a hot object, hot liquid, or steam. Most second-degree burns are thermal injuries.  · Radiation. Sunlight is one type of radiation that can burn the skin. Another type of radiation is used to heat food. Radiation is also used to treat some diseases, such as cancer. All types of radiation can burn the skin. Sunlight usually causes a first-degree burn. Radiation used for heating food or treating a disease can cause a second-degree burn.  · Electricity. Electrical burns can cause more damage under the skin than on the surface. They should always be treated as major burns.  · Chemicals. Many chemicals can burn the skin. The burn should be flushed with cool water and checked by an emergency caregiver.  SYMPTOMS  Symptoms of second-degree burns include:  · Severe pain.  · Extreme tenderness.  · Deep redness.  · Blistered skin.  · Skin that has changed color. It might look blotchy,  wet, or shiny.  · Swelling.  TREATMENT  Some second-degree burns may need to be treated in a hospital. These include major burns, electrical burns, and chemical burns. Many other second-degree burns can be treated with regular first aid, such as:  · Cooling the burn. Use cool, germ-free (sterile) salt water. Place the burned area of skin into a tub of water, or cover the burned area with clean, wet towels.  · Taking pain medicine.  · Removing the dead skin from broken blisters. A trained caregiver may do this. Do not pop blisters.  · Gently washing your skin with mild soap.  · Covering the burned area with a cream. Silver sulfadiazine is a cream for burns. An antibiotic cream, such as bacitracin, may also be used to fight infection. Do not use other ointments or creams unless your caregiver says it is okay.  · Protecting the burn with a sterile, non-sticky bandage.  · Bandaging fingers and toes separately. This keeps them from sticking together.  · Taking an antibiotic. This can help prevent infection.  · Getting a tetanus shot.  HOME CARE INSTRUCTIONS  Medication  · Take any medicine prescribed by your caregiver. Follow the directions carefully.  · Ask your caregiver if you can take over-the-counter medicine to relieve pain and swelling. Do not give aspirin to children.  · Make sure your caregiver knows about all other medicines you take. This includes over-the-counter medicines.  Burn care  · You will need to change the bandage on   your burn. You may need to do this 2 or 3 times each day.  ¨ Gently clean the burned area.  ¨ Put ointment on it.  ¨ Cover the burn with a sterile bandage.  · For some deeper burns or burns that cover a large area, compression garments may be prescribed. These garments can help minimize scarring and protect your mobility.  · Do not put butter or oil on your skin. Use only the cream prescribed by your caregiver.  · Do not put ice on your burn.  · Do not break blisters on your  skin.  · Keep the bandaged area dry. You might need to take a sponge bath for awhile. Ask your caregiver when you can take a shower or a tub bath again.  · Do not scratch an itchy burn. Your caregiver may give you medicine to relieve very bad itching.  · Infection is a big danger after a second-degree burn. Tell your caregiver right away if you have signs of infection, such as:  ¨ Redness or changing color in the burned area.  ¨ Fluid leaking from the burn.  ¨ Swelling in the burn area.  ¨ A bad smell coming from the wound.  Follow-up  · Keep all follow-up appointments. This is important. This is how your caregiver can tell if your treatment is working.  · Protect your burn from sunlight. Use sunscreen whenever you go outside. Burned areas may be sensitive to the sun for up to 1 year. Exposure to the sun may also cause permanent darkening of scars.  SEEK MEDICAL CARE IF:  · You have any questions about medicines.  · You have any questions about your treatment.  · You wonder if it is okay to do a particular activity.  · You develop a fever of more than 100.5° F (38.1° C).  SEEK IMMEDIATE MEDICAL CARE IF:  · You think your burn might be infected. It may change color, become red, leak fluid, swell, or smell bad.  · You develop a fever of more than 102° F (38.9° C).  Document Released: 09/25/2010 Document Revised: 07/16/2011 Document Reviewed: 09/25/2010  [employer]® Patient Information ©2015 [employer]. This information is not intended to replace advice given to you by your health care provider. Make sure you discuss any questions you have with your health care provider.
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
## 2014-04-23 ENCOUNTER — Ambulatory Visit (INDEPENDENT_AMBULATORY_CARE_PROVIDER_SITE_OTHER): Payer: BC Managed Care – PPO | Admitting: Family Medicine

## 2014-04-23 VITALS — BP 150/80 | HR 67 | Temp 98.4°F | Resp 18 | Ht 67.0 in | Wt 215.0 lb

## 2014-04-23 DIAGNOSIS — T310 Burns involving less than 10% of body surface: Secondary | ICD-10-CM
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
## 2014-07-09 ENCOUNTER — Ambulatory Visit: Payer: BC Managed Care – PPO | Admitting: Family Medicine

## 2014-09-11 ENCOUNTER — Other Ambulatory Visit: Payer: Self-pay | Admitting: Endocrinology

## 2014-09-30 ENCOUNTER — Telehealth: Payer: Self-pay | Admitting: Endocrinology

## 2014-09-30 NOTE — Telephone Encounter (Signed)
Pt was contacted about her making a visit and a voicemail was left on 09/30/2014

## 2014-11-24 ENCOUNTER — Ambulatory Visit (INDEPENDENT_AMBULATORY_CARE_PROVIDER_SITE_OTHER): Payer: BC Managed Care – PPO | Admitting: Endocrinology

## 2014-11-24 ENCOUNTER — Encounter: Payer: BC Managed Care – PPO | Admitting: *Deleted

## 2014-11-24 ENCOUNTER — Encounter: Payer: Self-pay | Admitting: Endocrinology

## 2014-11-24 VITALS — BP 158/92 | HR 68 | Wt 221.0 lb

## 2014-11-24 DIAGNOSIS — I10 Essential (primary) hypertension: Secondary | ICD-10-CM | POA: Diagnosis not present

## 2014-11-24 MED ORDER — BENAZEPRIL-HYDROCHLOROTHIAZIDE 20-25 MG PO TABS: 1.0000 | ORAL_TABLET | Freq: Every day | ORAL | Status: DC

## 2014-11-24 NOTE — Patient Instructions (Signed)
Please increase the benazepril-HCT.  i have sent a prescription to your pharmacy Please come back for a regular physical appointment in 3 months (must be after 03/02/15).

## 2014-11-24 NOTE — Progress Notes (Signed)
Subjective:    Patient ID: Tonya Taylor, female    DOB: [birth year], 59 y.o.   MRN: [id number]  HPI Pt returns for f/u of HTN.  She takes benazepril-HCT as rx'ed.  pt states she feels well in general, except for anxiety, related to a family illness Past Medical History  Diagnosis Date  . ANEMIA DUE TO CHRONIC BLOOD LOSS 04/05/2008  . Edema 01/02/2008  . HYPERLIPIDEMIA 02/01/2007  . HYPERTENSION 02/01/2007  . INSOMNIA 02/01/2007  . NEUTROPENIA UNSPECIFIED 01/31/2009  . Other chronic nonalcoholic liver disease 46/09/352  . TOBACCO USE, QUIT 02/01/2007    No past surgical history on file.  History   Social History  . Marital Status: Married    Spouse Name: N/A  . Number of Children: N/A  . Years of Education: N/A   Occupational History  . Works for school system    Social History Main Topics  . Smoking status: Former Research scientist (life sciences)  . Smokeless tobacco: Not on file  . Alcohol Use: Not on file  . Drug Use: No  . Sexual Activity: Not on file   Other Topics Concern  . Not on file   Social History Narrative    Current Outpatient Prescriptions on File Prior to Visit  Medication Sig Dispense Refill  . Ascorbic Acid (VITAMIN C) 1000 MG tablet Take 1,000 mg by mouth daily.      Marland Kitchen aspirin 81 MG tablet Take 81 mg by mouth daily.      . B Complex-C (SUPER B COMPLEX) TABS Take 1 tablet by mouth daily.      . Biotin 1000 MCG tablet Take 1,000 mcg by mouth daily.      . Calcium Carbonate-Vit D-Min (CALCIUM 600+D PLUS MINERALS PO) Take 2 tablets by mouth daily.      . cetirizine (ZYRTEC) 10 MG tablet Take 10 mg by mouth daily.      . Cholecalciferol (D 5000) 5000 UNITS TABS Take 1 tablet by mouth daily.      . Coenzyme Q10 (COQ10 PO) Take 1 capsule by mouth daily.      Marland Kitchen ECHINACEA PO Take 1 capsule by mouth daily. 760mg      . Magnesium 400 MG CAPS Take 1 capsule by mouth daily.      . Multiple Vitamin (MULTIVITAMIN) tablet Take 1 tablet by mouth daily.      . Omega-3 1400 MG CAPS Take  1 capsule by mouth daily.      Marland Kitchen RESVERATROL 100 MG CAPS Take 1 capsule by mouth daily.      . silver sulfADIAZINE (SILVADENE) 1 % cream Apply 1 application topically daily. 400 g 0  . simvastatin (ZOCOR) 40 MG tablet TAKE 1 TABLET DAILY 90 tablet 0  . vitamin B-12 (CYANOCOBALAMIN) 1000 MCG tablet Take 1,000 mcg by mouth daily.      . vitamin E 1000 UNIT capsule Take 1,000 Units by mouth daily.       No current facility-administered medications on file prior to visit.    No Known Allergies  Family History  Problem Relation Age of Onset  . Cancer Mother     Lung Cancer    BP 158/92 mmHg  Pulse 68  Wt 221 lb (100.245 kg)  SpO2 97%  Review of Systems Denies cough    Objective:   Physical Exam VITAL SIGNS:  See vs page GENERAL: no distress LUNGS:  Clear to auscultation HEART:  Regular rate and rhythm without murmurs noted. Normal S1,S2.   Ext:  no edema      Assessment & Plan:  HTN: she needs increased rx  Patient is advised the following: Patient Instructions  Please increase the benazepril-HCT.  i have sent a prescription to your pharmacy Please come back for a regular physical appointment in 3 months (must be after 03/02/15).

## 2014-12-10 ENCOUNTER — Other Ambulatory Visit: Payer: Self-pay | Admitting: Endocrinology

## 2015-03-04 ENCOUNTER — Encounter: Payer: BC Managed Care – PPO | Admitting: Endocrinology

## 2015-03-16 ENCOUNTER — Encounter: Payer: Self-pay | Admitting: Endocrinology

## 2015-03-16 ENCOUNTER — Ambulatory Visit (INDEPENDENT_AMBULATORY_CARE_PROVIDER_SITE_OTHER): Payer: BC Managed Care – PPO | Admitting: Endocrinology

## 2015-03-16 VITALS — BP 140/88 | HR 70 | Temp 98.3°F | Ht 67.0 in | Wt 218.0 lb

## 2015-03-16 DIAGNOSIS — Z0000 Encounter for general adult medical examination without abnormal findings: Secondary | ICD-10-CM | POA: Diagnosis not present

## 2015-03-16 DIAGNOSIS — E559 Vitamin D deficiency, unspecified: Secondary | ICD-10-CM | POA: Diagnosis not present

## 2015-03-16 DIAGNOSIS — D500 Iron deficiency anemia secondary to blood loss (chronic): Secondary | ICD-10-CM

## 2015-03-16 DIAGNOSIS — R739 Hyperglycemia, unspecified: Secondary | ICD-10-CM

## 2015-03-16 DIAGNOSIS — Z23 Encounter for immunization: Secondary | ICD-10-CM | POA: Diagnosis not present

## 2015-03-16 DIAGNOSIS — I10 Essential (primary) hypertension: Secondary | ICD-10-CM

## 2015-03-16 LAB — CBC WITH DIFFERENTIAL/PLATELET
Basophils Absolute: 0 10*3/uL (ref 0.0–0.1)
Basophils Relative: 1.2 % (ref 0.0–3.0)
EOS ABS: 0.2 10*3/uL (ref 0.0–0.7)
EOS PCT: 6.5 % — AB (ref 0.0–5.0)
HCT: 42.6 % (ref 36.0–46.0)
Hemoglobin: 14.2 g/dL (ref 12.0–15.0)
LYMPHS ABS: 1.2 10*3/uL (ref 0.7–4.0)
Lymphocytes Relative: 39.9 % (ref 12.0–46.0)
MCHC: 33.3 g/dL (ref 30.0–36.0)
MCV: 85.6 fl (ref 78.0–100.0)
MONO ABS: 0.3 10*3/uL (ref 0.1–1.0)
Monocytes Relative: 10.5 % (ref 3.0–12.0)
NEUTROS PCT: 41.9 % — AB (ref 43.0–77.0)
Neutro Abs: 1.3 10*3/uL — ABNORMAL LOW (ref 1.4–7.7)
Platelets: 221 10*3/uL (ref 150.0–400.0)
RBC: 4.97 Mil/uL (ref 3.87–5.11)
RDW: 13.6 % (ref 11.5–15.5)
WBC: 3 10*3/uL — ABNORMAL LOW (ref 4.0–10.5)

## 2015-03-16 LAB — LIPID PANEL
Cholesterol: 150 mg/dL (ref 0–200)
HDL: 53.5 mg/dL (ref >39.00)
LDL CALC: 84 mg/dL (ref 0–99)
NONHDL: 96.44
Total CHOL/HDL Ratio: 3
Triglycerides: 61 mg/dL (ref 0.0–149.0)
VLDL: 12.2 mg/dL (ref 0.0–40.0)

## 2015-03-16 LAB — URINALYSIS, ROUTINE W REFLEX MICROSCOPIC
Bilirubin Urine: NEGATIVE
HGB URINE DIPSTICK: NEGATIVE
Ketones, ur: NEGATIVE
Leukocytes, UA: NEGATIVE
NITRITE: NEGATIVE
RBC / HPF: NONE SEEN (ref 0–?)
SPECIFIC GRAVITY, URINE: 1.02 (ref 1.000–1.030)
TOTAL PROTEIN, URINE-UPE24: NEGATIVE
URINE GLUCOSE: NEGATIVE
Urobilinogen, UA: 0.2 (ref 0.0–1.0)
WBC, UA: NONE SEEN (ref 0–?)
pH: 7 (ref 5.0–8.0)

## 2015-03-16 LAB — HEPATIC FUNCTION PANEL
ALBUMIN: 4.1 g/dL (ref 3.5–5.2)
ALT: 40 U/L — ABNORMAL HIGH (ref 0–35)
AST: 36 U/L (ref 0–37)
Alkaline Phosphatase: 48 U/L (ref 39–117)
Bilirubin, Direct: 0.1 mg/dL (ref 0.0–0.3)
TOTAL PROTEIN: 7.5 g/dL (ref 6.0–8.3)
Total Bilirubin: 0.6 mg/dL (ref 0.2–1.2)

## 2015-03-16 LAB — BASIC METABOLIC PANEL
BUN: 13 mg/dL (ref 6–23)
CALCIUM: 9.5 mg/dL (ref 8.4–10.5)
CO2: 26 mEq/L (ref 19–32)
Chloride: 104 mEq/L (ref 96–112)
Creatinine, Ser: 0.89 mg/dL (ref 0.40–1.20)
GFR: 83.71 mL/min (ref >60.00)
Glucose, Bld: 98 mg/dL (ref 70–99)
Potassium: 3.6 mEq/L (ref 3.5–5.1)
SODIUM: 139 meq/L (ref 135–145)

## 2015-03-16 LAB — TSH: TSH: 1.79 u[IU]/mL (ref 0.35–4.50)

## 2015-03-16 LAB — IBC PANEL
IRON: 106 ug/dL (ref 42–145)
SATURATION RATIOS: 29.1 % (ref 20.0–50.0)
Transferrin: 260 mg/dL (ref 212.0–360.0)

## 2015-03-16 LAB — VITAMIN D 25 HYDROXY (VIT D DEFICIENCY, FRACTURES): VITD: 60.45 ng/mL (ref 30.00–100.00)

## 2015-03-16 LAB — HEMOGLOBIN A1C: Hgb A1c MFr Bld: 5.5 % (ref 4.6–6.5)

## 2015-03-16 NOTE — Progress Notes (Signed)
Subjective:    Patient ID: Tonya Taylor, female    DOB: Feb 21, 1956, 59 y.o.   MRN: [id number]  HPI Pt is here for regular wellness examination, and is feeling pretty well in general (except for left arm pain), and says chronic med probs are stable, except as noted below Past Medical History  Diagnosis Date  . ANEMIA DUE TO CHRONIC BLOOD LOSS 04/05/2008  . Edema 01/02/2008  . HYPERLIPIDEMIA 02/01/2007  . HYPERTENSION 02/01/2007  . INSOMNIA 02/01/2007  . NEUTROPENIA UNSPECIFIED 01/31/2009  . Other chronic nonalcoholic liver disease 58/09/9290  . TOBACCO USE, QUIT 02/01/2007    No past surgical history on file.  Social History   Social History  . Marital Status: Married    Spouse Name: N/A  . Number of Children: N/A  . Years of Education: N/A   Occupational History  . Works for school system    Social History Main Topics  . Smoking status: Former Research scientist (life sciences)  . Smokeless tobacco: Not on file  . Alcohol Use: Not on file  . Drug Use: No  . Sexual Activity: Not on file   Other Topics Concern  . Not on file   Social History Narrative    Current Outpatient Prescriptions on File Prior to Visit  Medication Sig Dispense Refill  . Ascorbic Acid (VITAMIN C) 1000 MG tablet Take 1,000 mg by mouth daily.      Marland Kitchen aspirin 81 MG tablet Take 81 mg by mouth daily.      . B Complex-C (SUPER B COMPLEX) TABS Take 1 tablet by mouth daily.      . benazepril-hydrochlorthiazide (LOTENSIN HCT) 20-25 MG per tablet Take 1 tablet by mouth daily. 90 tablet 3  . Biotin 1000 MCG tablet Take 1,000 mcg by mouth daily.      . Calcium Carbonate-Vit D-Min (CALCIUM 600+D PLUS MINERALS PO) Take 2 tablets by mouth daily.      . cetirizine (ZYRTEC) 10 MG tablet Take 10 mg by mouth daily.      . Cholecalciferol (D 5000) 5000 UNITS TABS Take 1 tablet by mouth daily.      . Coenzyme Q10 (COQ10 PO) Take 1 capsule by mouth daily.      Marland Kitchen ECHINACEA PO Take 1 capsule by mouth daily. 760mg      . Magnesium 400 MG CAPS  Take 1 capsule by mouth daily.      . Multiple Vitamin (MULTIVITAMIN) tablet Take 1 tablet by mouth daily.      . Omega-3 1400 MG CAPS Take 1 capsule by mouth daily.      Marland Kitchen RESVERATROL 100 MG CAPS Take 1 capsule by mouth daily.      . simvastatin (ZOCOR) 40 MG tablet TAKE 1 TABLET DAILY 90 tablet 1  . vitamin B-12 (CYANOCOBALAMIN) 1000 MCG tablet Take 1,000 mcg by mouth daily.      . vitamin E 1000 UNIT capsule Take 1,000 Units by mouth daily.       No current facility-administered medications on file prior to visit.    No Known Allergies  Family History  Problem Relation Age of Onset  . Cancer Mother     Lung Cancer    BP 140/88 mmHg  Pulse 70  Temp(Src) 98.3 F (36.8 C) (Oral)  Ht 5\' 7"  (1.702 m)  Wt 218 lb (98.884 kg)  BMI 34.14 kg/m2  SpO2 98%  Review of Systems  Constitutional: Negative for fever and unexpected weight change.  HENT: Negative for hearing loss.  Eyes: Negative for visual disturbance.  Respiratory: Negative for shortness of breath.   Cardiovascular: Negative for chest pain.  Gastrointestinal: Negative for anal bleeding.  Endocrine: Negative for cold intolerance.  Genitourinary: Negative for hematuria.  Musculoskeletal: Negative for back pain.  Skin: Negative for rash.  Allergic/Immunologic: Positive for environmental allergies.  Psychiatric/Behavioral: Negative for dysphoric mood.       Objective:   Physical Exam VS: see vs page GEN: no distress HEAD: head: no deformity eyes: no periorbital swelling, no proptosis external nose and ears are normal mouth: no lesion seen NECK: supple, thyroid is not enlarged CHEST WALL: no deformity LUNGS:  Clear to auscultation.   BREASTS: sees gyn.   CV: reg rate and rhythm, no murmur ABD: abdomen is soft, nontender.  no hepatosplenomegaly.  not distended.  no hernia GENITALIA/RECTAL: sees gyn. MUSCULOSKELETAL: muscle bulk and strength are grossly normal.  no obvious joint swelling.  gait is normal and  steady EXTEMITIES: no deformity.  no ulcer on the feet.  feet are of normal color and temp.  no edema PULSES: dorsalis pedis intact bilat.  no carotid bruit NEURO:  cn 2-12 grossly intact.   readily moves all 4's.  sensation is intact to touch on the feet SKIN:  Normal texture and temperature.  No rash or suspicious lesion is visible.   NODES:  None palpable at the neck PSYCH: alert, well-oriented.  Does not appear anxious nor depressed.    i personally reviewed electrocardiogram tracing (today): Indication: HTN Impression:normal    Assessment & Plan:  Wellness visit today, with problems stable, except as noted.    Patient is advised the following: Patient Instructions  please consider these measures for your health:  minimize alcohol.  do not use tobacco products.  have a colonoscopy at least every 10 years from age 80.  Women should have an annual mammogram from age 65.  keep firearms safely stored.  always use seat belts.  have working smoke alarms in your home.  see an eye doctor and dentist regularly.  never drive under the influence of alcohol or drugs (including prescription drugs).   please let me know what your wishes would be, if artificial life support measures should become necessary.   Please call if you want to see a specialist for your left arm pain.  blood tests are requested for you today.  We'll let you know about the results.   Please return in 1 year.   Please come back in 1-2 weeks, for a blood pressure recheck.

## 2015-03-16 NOTE — Progress Notes (Signed)
we discussed code status.  pt requests full code, but would not want to be started or maintained on artificial life-support measures if there was not a reasonable chance of recovery 

## 2015-03-16 NOTE — Patient Instructions (Addendum)
please consider these measures for your health:  minimize alcohol.  do not use tobacco products.  have a colonoscopy at least every 10 years from age 58.  Women should have an annual mammogram from age 53.  keep firearms safely stored.  always use seat belts.  have working smoke alarms in your home.  see an eye doctor and dentist regularly.  never drive under the influence of alcohol or drugs (including prescription drugs).   please let me know what your wishes would be, if artificial life support measures should become necessary.   Please call if you want to see a specialist for your left arm pain.  blood tests are requested for you today.  We'll let you know about the results.   Please return in 1 year.   Please come back in 1-2 weeks, for a blood pressure recheck.

## 2015-03-28 ENCOUNTER — Ambulatory Visit: Payer: BC Managed Care – PPO

## 2015-03-28 VITALS — BP 136/74

## 2015-03-28 DIAGNOSIS — I10 Essential (primary) hypertension: Secondary | ICD-10-CM

## 2015-04-29 ENCOUNTER — Other Ambulatory Visit: Payer: Self-pay

## 2015-04-29 DIAGNOSIS — Z1231 Encounter for screening mammogram for malignant neoplasm of breast: Secondary | ICD-10-CM

## 2015-05-25 ENCOUNTER — Ambulatory Visit
Admission: RE | Admit: 2015-05-25 | Discharge: 2015-05-25 | Disposition: A | Discharge status: Home / Self Care | Payer: BC Managed Care – PPO | Source: Ambulatory Visit

## 2015-05-25 DIAGNOSIS — Z1231 Encounter for screening mammogram for malignant neoplasm of breast: Secondary | ICD-10-CM

## 2015-06-06 ENCOUNTER — Other Ambulatory Visit: Payer: Self-pay | Admitting: Endocrinology

## 2015-07-15 ENCOUNTER — Ambulatory Visit (INDEPENDENT_AMBULATORY_CARE_PROVIDER_SITE_OTHER): Payer: BC Managed Care – PPO

## 2015-07-15 ENCOUNTER — Ambulatory Visit (INDEPENDENT_AMBULATORY_CARE_PROVIDER_SITE_OTHER): Payer: BC Managed Care – PPO | Admitting: Family Medicine

## 2015-07-15 VITALS — BP 147/87 | HR 80 | Temp 98.2°F | Resp 16 | Ht 68.0 in | Wt 213.0 lb

## 2015-07-15 DIAGNOSIS — R05 Cough: Secondary | ICD-10-CM

## 2015-07-15 DIAGNOSIS — J189 Pneumonia, unspecified organism: Secondary | ICD-10-CM | POA: Diagnosis not present

## 2015-07-15 DIAGNOSIS — R059 Cough, unspecified: Secondary | ICD-10-CM

## 2015-07-15 MED ORDER — AMOXICILLIN-POT CLAVULANATE 875-125 MG PO TABS: 1.0000 | ORAL_TABLET | Freq: Two times a day (BID) | ORAL | Status: DC

## 2015-07-15 MED ORDER — HYDROCOD POLST-CPM POLST ER 10-8 MG/5ML PO SUER: 5.0000 mL | Freq: Two times a day (BID) | ORAL | Status: DC | PRN

## 2015-07-15 MED ORDER — BENZONATATE 100 MG PO CAPS: 100.0000 mg | ORAL_CAPSULE | Freq: Three times a day (TID) | ORAL | Status: DC | PRN

## 2015-07-15 NOTE — Progress Notes (Signed)
Subjective:    Patient ID: Tonya Taylor, female    DOB: 1955/09/30, 60 y.o.   MRN: [id number] By signing my name below, I, Tonya Taylor, attest that this documentation has been prepared under the direction and in the presence of Delman Cheadle, MD. Electronically Signed: Judithe Taylor, ER Scribe. 07/15/2015. 7:10 PM.  Chief Complaint  Patient presents with  . Cough    x 1 week, pt. loosing voice   . chest congestion    HPI HPI Comments: Tonya Taylor is a 60 y.o. female who presents to [facility] complaining of dry cough and chest congestion with associated sleep disturbance. Her sx started two weeks ago with cough, chest congestion, fever and chills that gradually improved over a week but then she developed a sore throat five days ago which progressed back into a cough and more chest congestion. She denies SOB or lung tightness. She has been taking mucinex, delsom and nyquil.    Past Medical History  Diagnosis Date  . ANEMIA DUE TO CHRONIC BLOOD LOSS 04/05/2008  . Edema 01/02/2008  . HYPERLIPIDEMIA 02/01/2007  . HYPERTENSION 02/01/2007  . INSOMNIA 02/01/2007  . NEUTROPENIA UNSPECIFIED 01/31/2009  . Other chronic nonalcoholic liver disease 82/09/537  . TOBACCO USE, QUIT 02/01/2007   No Known Allergies   Current Outpatient Prescriptions on File Prior to Visit  Medication Sig Dispense Refill  . Ascorbic Acid (VITAMIN C) 1000 MG tablet Take 1,000 mg by mouth daily.      Marland Kitchen aspirin 81 MG tablet Take 81 mg by mouth daily.      . B Complex-C (SUPER B COMPLEX) TABS Take 1 tablet by mouth daily.      . benazepril-hydrochlorthiazide (LOTENSIN HCT) 20-25 MG per tablet Take 1 tablet by mouth daily. 90 tablet 3  . Biotin 1000 MCG tablet Take 1,000 mcg by mouth daily.      . Calcium Carbonate-Vit D-Min (CALCIUM 600+D PLUS MINERALS PO) Take 2 tablets by mouth daily.      . cetirizine (ZYRTEC) 10 MG tablet Take 10 mg by mouth daily.      . Cholecalciferol (D 5000) 5000 UNITS TABS Take 1  tablet by mouth daily.      . Coenzyme Q10 (COQ10 PO) Take 1 capsule by mouth daily.      Marland Kitchen ECHINACEA PO Take 1 capsule by mouth daily. 756m     . Magnesium 400 MG CAPS Take 1 capsule by mouth daily.      . Multiple Vitamin (MULTIVITAMIN) tablet Take 1 tablet by mouth daily.      . Omega-3 1400 MG CAPS Take 1 capsule by mouth daily.      . simvastatin (ZOCOR) 40 MG tablet TAKE 1 TABLET DAILY 90 tablet 0  . vitamin B-12 (CYANOCOBALAMIN) 1000 MCG tablet Take 1,000 mcg by mouth daily.      . vitamin E 1000 UNIT capsule Take 1,000 Units by mouth daily.       No current facility-administered medications on file prior to visit.    Review of Systems  Constitutional: Negative for fever and chills.  HENT: Positive for congestion and rhinorrhea.   Respiratory: Positive for cough. Negative for chest tightness, shortness of breath and wheezing.   Neurological: Negative for headaches.      Objective:  BP 147/87 mmHg  Pulse 80  Temp(Src) 98.2 F (36.8 C) (Oral)  Resp 16  Ht 5' 8"  (1.727 m)  Wt 213 lb (96.616 kg)  BMI 32.39 kg/m2  SpO2 97%  Physical Exam  Constitutional: She is oriented to person, place, and time. She appears well-developed and well-nourished. No distress.  HENT:  Head: Normocephalic and atraumatic.  Left TM with mid ear effusion and injection. Right TM normal. Nares with erythema and purulent rhinitis.   Eyes: Pupils are equal, round, and reactive to light.  Neck: Neck supple.  No lymphadenopathy.   Cardiovascular: Normal rate.   Pulmonary/Chest: Effort normal. No respiratory distress.  Decreased breath sounds in left lung with some inspiratory rhonchi that cleared with repeated breathing.   Musculoskeletal: Normal range of motion.  Neurological: She is alert and oriented to person, place, and time. Coordination normal.  Skin: Skin is warm and dry. She is not diaphoretic.  Psychiatric: She has a normal mood and affect. Her behavior is normal.  Nursing note and vitals  reviewed.  [facility] reading (PRIMARY) by  Dr. Brigitte Pulse: left base infiltrate.     Dg Chest 2 View  07/15/2015  CLINICAL DATA:  Cough and decreased breath sounds. EXAM: CHEST  2 VIEW COMPARISON:  None. FINDINGS: The cardiomediastinal silhouette is unremarkable. Equivocal left lower lung airspace disease noted. There is no evidence of pleural effusion, pulmonary edema or pneumothorax. No acute bony abnormalities are present. IMPRESSION: Equivocal left lower lung airspace disease/pneumonia. Radiographic follow-up to resolution recommended. Electronically Signed   By: Margarette Canada M.D.   On: 07/15/2015 19:09    Assessment & Plan:   1. Cough   2. CAP (community acquired pneumonia)     Orders Placed This Encounter  Procedures  . DG Chest 2 View    Standing Status: Future     Number of Occurrences: 1     Standing Expiration Date: 07/14/2016    Order Specific Question:  Reason for Exam (SYMPTOM  OR DIAGNOSIS REQUIRED)    Answer:  secondary worsening deep cough, decreased breath sounds    Order Specific Question:  Is the patient pregnant?    Answer:  No    Order Specific Question:  Preferred imaging location?    Answer:  External    Meds ordered this encounter  Medications  . chlorpheniramine-HYDROcodone (TUSSIONEX PENNKINETIC ER) 10-8 MG/5ML SUER    Sig: Take 5 mLs by mouth every 12 (twelve) hours as needed.    Dispense:  120 mL    Refill:  0  . benzonatate (TESSALON) 100 MG capsule    Sig: Take 1-2 capsules (100-200 mg total) by mouth 3 (three) times daily as needed for cough.    Dispense:  40 capsule    Refill:  0  . amoxicillin-clavulanate (AUGMENTIN) 875-125 MG tablet    Sig: Take 1 tablet by mouth 2 (two) times daily.    Dispense:  20 tablet    Refill:  0    I personally performed the services described in this documentation, which was scribed in my presence. The recorded information has been reviewed and considered, and addended by me as needed.  Delman Cheadle, MD MPH

## 2015-07-15 NOTE — Patient Instructions (Addendum)
   IF you received an x-ray today, you will receive an invoice from [facility]. Please contact [facility] at [phone] with questions or concerns regarding your invoice.   IF you received labwork today, you will receive an invoice from [facility]. Please contact [facility] at [phone] with questions or concerns regarding your invoice.   Our billing staff will not be able to assist you with questions regarding bills from these companies.  You will be contacted with the lab results as soon as they are available. The fastest way to get your results is to activate your My Chart account. Instructions are located on the last page of this paperwork. If you have not heard from us regarding the results in 2 weeks, please contact this office.      Community-Acquired Pneumonia, Adult Pneumonia is an infection of the lungs. There are different types of pneumonia. One type can develop while a person is in a hospital. A different type, called community-acquired pneumonia, develops in people who are not, or have not recently been, in the hospital or other health care facility.  CAUSES Pneumonia may be caused by bacteria, viruses, or funguses. Community-acquired pneumonia is often caused by Streptococcus pneumonia bacteria. These bacteria are often passed from one person to another by breathing in droplets from the cough or sneeze of an infected person. RISK FACTORS The condition is more likely to develop in:  People who havechronic diseases, such as chronic obstructive pulmonary disease (COPD), asthma, congestive heart failure, cystic fibrosis, diabetes, or kidney disease.  People who haveearly-stage or late-stage HIV.  People who havesickle cell disease.  People who havehad their spleen removed (splenectomy).  People who havepoor dental hygiene.  People who havemedical conditions that increase the risk of breathing in (aspirating)  secretions their own mouth and nose.   People who havea weakened immune system (immunocompromised).  People who smoke.  People whotravel to areas where pneumonia-causing germs commonly exist.  People whoare around animal habitats or animals that have pneumonia-causing germs, including birds, bats, rabbits, cats, and farm animals. SYMPTOMS Symptoms of this condition include:  Adry cough.  A wet (productive) cough.  Fever.  Sweating.  Chest pain, especially when breathing deeply or coughing.  Rapid breathing or difficulty breathing.  Shortness of breath.  Shaking chills.  Fatigue.  Muscle aches. DIAGNOSIS Your health care provider will take a medical history and perform a physical exam. You may also have other tests, including:  Imaging studies of your chest, including X-rays.  Tests to check your blood oxygen level and other blood gases.  Other tests on blood, mucus (sputum), fluid around your lungs (pleural fluid), and urine. If your pneumonia is severe, other tests may be done to identify the specific cause of your illness. TREATMENT The type of treatment that you receive depends on many factors, such as the cause of your pneumonia, the medicines you take, and other medical conditions that you have. For most adults, treatment and recovery from pneumonia may occur at home. In some cases, treatment must happen in a hospital. Treatment may include:  Antibiotic medicines, if the pneumonia was caused by bacteria.  Antiviral medicines, if the pneumonia was caused by a virus.  Medicines that are given by mouth or through an IV tube.  Oxygen.  Respiratory therapy. Although rare, treating severe pneumonia may include:  Mechanical ventilation. This is done if you are not breathing well on your own and you cannot maintain a safe blood oxygen level.    Thoracentesis. This procedureremoves fluid around one lung or both lungs to help you breathe better. HOME CARE  INSTRUCTIONS  Take over-the-counter and prescription medicines only as told by your health care provider.  Only takecough medicine if you are losing sleep. Understand that cough medicine can prevent your body's natural ability to remove mucus from your lungs.  If you were prescribed an antibiotic medicine, take it as told by your health care provider. Do not stop taking the antibiotic even if you start to feel better.  Sleep in a semi-upright position at night. Try sleeping in a reclining chair, or place a few pillows under your head.  Do not use tobacco products, including cigarettes, chewing tobacco, and e-cigarettes. If you need help quitting, ask your health care provider.  Drink enough water to keep your urine clear or pale yellow. This will help to thin out mucus secretions in your lungs. PREVENTION There are ways that you can decrease your risk of developing community-acquired pneumonia. Consider getting a pneumococcal vaccine if:  You are older than 59 years of age.  You are older than 59 years of age and are undergoing cancer treatment, have chronic lung disease, or have other medical conditions that affect your immune system. Ask your health care provider if this applies to you. There are different types and schedules of pneumococcal vaccines. Ask your health care provider which vaccination option is best for you. You may also prevent community-acquired pneumonia if you take these actions:  Get an influenza vaccine every year. Ask your health care provider which type of influenza vaccine is best for you.  Go to the dentist on a regular basis.  Wash your hands often. Use hand sanitizer if soap and water are not available. SEEK MEDICAL CARE IF:  You have a fever.  You are losing sleep because you cannot control your cough with cough medicine. SEEK IMMEDIATE MEDICAL CARE IF:  You have worsening shortness of breath.  You have increased chest pain.  Your sickness becomes  worse, especially if you are an older adult or have a weakened immune system.  You cough up blood.   This information is not intended to replace advice given to you by your health care provider. Make sure you discuss any questions you have with your health care provider.   Document Released: 04/23/2005 Document Revised: 01/12/2015 Document Reviewed: 08/18/2014 Elsevier Interactive Patient Education 2016 [employer] Inc[employer]  

## 2015-10-26 ENCOUNTER — Other Ambulatory Visit: Payer: Self-pay

## 2015-10-26 ENCOUNTER — Telehealth: Payer: Self-pay | Admitting: Endocrinology

## 2015-10-26 MED ORDER — SIMVASTATIN 40 MG PO TABS: 40.0000 mg | ORAL_TABLET | Freq: Every day | ORAL | Status: DC

## 2015-10-26 NOTE — Telephone Encounter (Signed)
Patient need a refill of medication simvastatin (ZOCOR) 40 MG tablet, send to [facility] fax [phone]

## 2015-11-23 ENCOUNTER — Other Ambulatory Visit: Payer: Self-pay

## 2015-11-23 MED ORDER — BENAZEPRIL-HYDROCHLOROTHIAZIDE 20-25 MG PO TABS: 1.0000 | ORAL_TABLET | Freq: Every day | ORAL | Status: DC

## 2015-11-23 MED ORDER — SIMVASTATIN 40 MG PO TABS: 40.0000 mg | ORAL_TABLET | Freq: Every day | ORAL | Status: DC

## 2016-01-18 ENCOUNTER — Ambulatory Visit (INDEPENDENT_AMBULATORY_CARE_PROVIDER_SITE_OTHER): Payer: BC Managed Care – PPO | Admitting: Endocrinology

## 2016-01-18 ENCOUNTER — Encounter: Payer: Self-pay | Admitting: Endocrinology

## 2016-01-18 VITALS — Ht 68.0 in

## 2016-01-18 DIAGNOSIS — Z0189 Encounter for other specified special examinations: Secondary | ICD-10-CM

## 2016-01-18 NOTE — Progress Notes (Signed)
   Subjective:    Patient ID: Tonya Taylor, female    DOB: [birth year], 60 y.o.   MRN: [id number]  HPI    Review of Systems     Objective:   Physical Exam        Assessment & Plan:

## 2016-01-21 ENCOUNTER — Other Ambulatory Visit: Payer: Self-pay | Admitting: Endocrinology

## 2016-03-15 ENCOUNTER — Ambulatory Visit: Payer: BC Managed Care – PPO | Admitting: Endocrinology

## 2016-03-18 NOTE — Progress Notes (Signed)
Subjective:    Patient ID: Tonya Taylor, female    DOB: January 24, 1956, 60 y.o.   MRN: [id number]  HPI Pt is here for regular wellness examination, and is feeling pretty well in general, and says chronic med probs are stable, except as noted below Past Medical History:  Diagnosis Date  . ANEMIA DUE TO CHRONIC BLOOD LOSS 04/05/2008  . Edema 01/02/2008  . HYPERLIPIDEMIA 02/01/2007  . HYPERTENSION 02/01/2007  . INSOMNIA 02/01/2007  . NEUTROPENIA UNSPECIFIED 01/31/2009  . Other chronic nonalcoholic liver disease 123456  . TOBACCO USE, QUIT 02/01/2007    No past surgical history on file.  Social History   Social History  . Marital status: Married    Spouse name: N/A  . Number of children: N/A  . Years of education: N/A   Occupational History  . Works for school system    Social History Main Topics  . Smoking status: Former Research scientist (life sciences)  . Smokeless tobacco: Never Used  . Alcohol use No  . Drug use: No  . Sexual activity: Not on file   Other Topics Concern  . Not on file   Social History Narrative  . No narrative on file    Current Outpatient Prescriptions on File Prior to Visit  Medication Sig Dispense Refill  . Ascorbic Acid (VITAMIN C) 1000 MG tablet Take 1,000 mg by mouth daily.      Marland Kitchen aspirin 81 MG tablet Take 81 mg by mouth daily.      . B Complex-C (SUPER B COMPLEX) TABS Take 1 tablet by mouth daily.      . Calcium Carbonate-Vit D-Min (CALCIUM 600+D PLUS MINERALS PO) Take 2 tablets by mouth daily.      . cetirizine (ZYRTEC) 10 MG tablet Take 10 mg by mouth daily.      . Cholecalciferol (D 5000) 5000 UNITS TABS Take 1 tablet by mouth daily.      . Coenzyme Q10 (COQ10 PO) Take 1 capsule by mouth daily.      Marland Kitchen ECHINACEA PO Take 1 capsule by mouth daily. 760mg      . Magnesium 400 MG CAPS Take 1 capsule by mouth daily.      . Multiple Vitamin (MULTIVITAMIN) tablet Take 1 tablet by mouth daily.      . Omega-3 1400 MG CAPS Take 1 capsule by mouth daily.      .  simvastatin (ZOCOR) 40 MG tablet Take 1 tablet (40 mg total) by mouth daily. 90 tablet 0  . vitamin B-12 (CYANOCOBALAMIN) 1000 MCG tablet Take 1,000 mcg by mouth daily.      . vitamin E 1000 UNIT capsule Take 1,000 Units by mouth daily.       No current facility-administered medications on file prior to visit.     No Known Allergies  Family History  Problem Relation Age of Onset  . Cancer Mother     Lung Cancer    BP (!) 165/99   Pulse 74   Ht 5\' 8"  (1.727 m)   Wt 218 lb (98.9 kg)   SpO2 97%   BMI 33.15 kg/m     Review of Systems Constitutional: Negative for fever.  HENT: Negative for hearing loss.   Eyes: Negative for visual disturbance.  Respiratory: Negative for shortness of breath.   Cardiovascular: Negative for chest pain.  Gastrointestinal: Negative for anal bleeding.  Endocrine: Negative for cold intolerance.  Genitourinary: Negative for hematuria.  Musculoskeletal: Negative for back pain.  Skin: Negative for rash.  Allergic/Immunologic:  Positive for environmental allergies.  Psychiatric/Behavioral: Negative for dysphoric mood.     Objective:   Physical Exam VS: see vs page GEN: no distress HEAD: head: no deformity eyes: no periorbital swelling, no proptosis external nose and ears are normal mouth: no lesion seen NECK: supple, thyroid is not enlarged. CHEST WALL: no deformity.  BREASTS: sees gyn.   CV: reg rate and rhythm, no murmur ABD: abdomen is soft, nontender.  no hepatosplenomegaly.  not distended.  no hernia GENITALIA/RECTAL: sees gyn. MUSCULOSKELETAL: muscle bulk and strength are grossly normal.  no obvious joint swelling.  gait is normal and steady.  Left knee brace (ses work comp Dr) Tonya Taylor: no deformity.  no ulcer on the feet.  feet are of normal color and temp.  1+ bilat leg edema.  There is bilateral onychomycosis of the toenails.  PULSES: dorsalis pedis intact bilat.  no carotid bruit NEURO:  cn 2-12 grossly intact.   readily moves all  4's.  sensation is intact to touch on the feet.   SKIN:  Normal texture and temperature.  No rash or suspicious lesion is visible.   NODES:  None palpable at the neck PSYCH: alert, well-oriented.  Does not appear anxious nor depressed.     i personally reviewed electrocardiogram tracing (today): Indication: HTN Impression: NS-TWA (no change)    Assessment & Plan:  Wellness visit today, with problems stable, except as noted.   SEPARATE EVALUATION FOLLOWS--EACH PROBLEM HERE IS NEW, NOT RESPONDING TO TREATMENT, OR POSES SIGNIFICANT RISK TO THE PATIENT'S HEALTH: HISTORY OF THE PRESENT ILLNESS: Pt has a few years of slight cough sensation in the throat, but no assoc wheezing.  PAST MEDICAL HISTORY Past Medical History:  Diagnosis Date  . ANEMIA DUE TO CHRONIC BLOOD LOSS 04/05/2008  . Edema 01/02/2008  . HYPERLIPIDEMIA 02/01/2007  . HYPERTENSION 02/01/2007  . INSOMNIA 02/01/2007  . NEUTROPENIA UNSPECIFIED 01/31/2009  . Other chronic nonalcoholic liver disease 123456  . TOBACCO USE, QUIT 02/01/2007    No past surgical history on file.  Social History   Social History  . Marital status: Married    Spouse name: N/A  . Number of children: N/A  . Years of education: N/A   Occupational History  . Works for school system    Social History Main Topics  . Smoking status: Former Research scientist (life sciences)  . Smokeless tobacco: Never Used  . Alcohol use No  . Drug use: No  . Sexual activity: Not on file   Other Topics Concern  . Not on file   Social History Narrative  . No narrative on file    Current Outpatient Prescriptions on File Prior to Visit  Medication Sig Dispense Refill  . Ascorbic Acid (VITAMIN C) 1000 MG tablet Take 1,000 mg by mouth daily.      Marland Kitchen aspirin 81 MG tablet Take 81 mg by mouth daily.      . B Complex-C (SUPER B COMPLEX) TABS Take 1 tablet by mouth daily.      . Calcium Carbonate-Vit D-Min (CALCIUM 600+D PLUS MINERALS PO) Take 2 tablets by mouth daily.      . cetirizine  (ZYRTEC) 10 MG tablet Take 10 mg by mouth daily.      . Cholecalciferol (D 5000) 5000 UNITS TABS Take 1 tablet by mouth daily.      . Coenzyme Q10 (COQ10 PO) Take 1 capsule by mouth daily.      Marland Kitchen ECHINACEA PO Take 1 capsule by mouth daily. 760mg      .  Magnesium 400 MG CAPS Take 1 capsule by mouth daily.      . Multiple Vitamin (MULTIVITAMIN) tablet Take 1 tablet by mouth daily.      . Omega-3 1400 MG CAPS Take 1 capsule by mouth daily.      . simvastatin (ZOCOR) 40 MG tablet Take 1 tablet (40 mg total) by mouth daily. 90 tablet 0  . vitamin B-12 (CYANOCOBALAMIN) 1000 MCG tablet Take 1,000 mcg by mouth daily.      . vitamin E 1000 UNIT capsule Take 1,000 Units by mouth daily.       No current facility-administered medications on file prior to visit.     No Known Allergies  Family History  Problem Relation Age of Onset  . Cancer Mother     Lung Cancer    BP (!) 165/99   Pulse 74   Ht 5\' 8"  (1.727 m)   Wt 218 lb (98.9 kg)   SpO2 97%   BMI 33.15 kg/m   REVIEW OF SYSTEMS:  No weight change PHYSICAL EXAMINATION: VITAL SIGNS:  See vs page GENERAL: no distress LUNGS:  Clear to auscultation LAB/XRAY RESULTS: Lab Results  Component Value Date   CREATININE 0.87 03/19/2016   BUN 14 03/19/2016   NA 140 03/19/2016   K 3.4 (L) 03/19/2016   CL 104 03/19/2016   CO2 30 03/19/2016  IMPRESSION: HTN: she needs increased rx Cough, due to ACEI PLAN:  I have sent a prescription to your pharmacy, for a new blood pressure pill. Recheck BP 30d

## 2016-03-19 ENCOUNTER — Ambulatory Visit (INDEPENDENT_AMBULATORY_CARE_PROVIDER_SITE_OTHER): Payer: BC Managed Care – PPO | Admitting: Endocrinology

## 2016-03-19 ENCOUNTER — Encounter: Payer: Self-pay | Admitting: Endocrinology

## 2016-03-19 VITALS — BP 165/99 | HR 74 | Ht 68.0 in | Wt 218.0 lb

## 2016-03-19 DIAGNOSIS — Z0000 Encounter for general adult medical examination without abnormal findings: Secondary | ICD-10-CM

## 2016-03-19 DIAGNOSIS — N951 Menopausal and female climacteric states: Secondary | ICD-10-CM | POA: Diagnosis not present

## 2016-03-19 DIAGNOSIS — E559 Vitamin D deficiency, unspecified: Secondary | ICD-10-CM | POA: Diagnosis not present

## 2016-03-19 DIAGNOSIS — Z23 Encounter for immunization: Secondary | ICD-10-CM | POA: Diagnosis not present

## 2016-03-19 DIAGNOSIS — R739 Hyperglycemia, unspecified: Secondary | ICD-10-CM | POA: Diagnosis not present

## 2016-03-19 LAB — CBC WITH DIFFERENTIAL/PLATELET
Basophils Absolute: 0 10*3/uL (ref 0.0–0.1)
Basophils Relative: 0.7 % (ref 0.0–3.0)
EOS ABS: 0.2 10*3/uL (ref 0.0–0.7)
Eosinophils Relative: 6.9 % — ABNORMAL HIGH (ref 0.0–5.0)
HCT: 38.2 % (ref 36.0–46.0)
HEMOGLOBIN: 12.7 g/dL (ref 12.0–15.0)
LYMPHS ABS: 1.6 10*3/uL (ref 0.7–4.0)
Lymphocytes Relative: 45 % (ref 12.0–46.0)
MCHC: 33.3 g/dL (ref 30.0–36.0)
MCV: 85.5 fl (ref 78.0–100.0)
MONO ABS: 0.5 10*3/uL (ref 0.1–1.0)
Monocytes Relative: 13.1 % — ABNORMAL HIGH (ref 3.0–12.0)
NEUTROS PCT: 34.3 % — AB (ref 43.0–77.0)
Neutro Abs: 1.2 10*3/uL — ABNORMAL LOW (ref 1.4–7.7)
Platelets: 248 10*3/uL (ref 150.0–400.0)
RBC: 4.47 Mil/uL (ref 3.87–5.11)
RDW: 13.6 % (ref 11.5–15.5)
WBC: 3.6 10*3/uL — AB (ref 4.0–10.5)

## 2016-03-19 LAB — HEPATIC FUNCTION PANEL
ALBUMIN: 3.8 g/dL (ref 3.5–5.2)
ALK PHOS: 43 U/L (ref 39–117)
ALT: 25 U/L (ref 0–35)
AST: 26 U/L (ref 0–37)
Bilirubin, Direct: 0.1 mg/dL (ref 0.0–0.3)
TOTAL PROTEIN: 7.3 g/dL (ref 6.0–8.3)
Total Bilirubin: 0.5 mg/dL (ref 0.2–1.2)

## 2016-03-19 LAB — URINALYSIS, ROUTINE W REFLEX MICROSCOPIC
Bilirubin Urine: NEGATIVE
HGB URINE DIPSTICK: NEGATIVE
KETONES UR: NEGATIVE
Leukocytes, UA: NEGATIVE
NITRITE: NEGATIVE
RBC / HPF: NONE SEEN (ref 0–?)
SPECIFIC GRAVITY, URINE: 1.02 (ref 1.000–1.030)
TOTAL PROTEIN, URINE-UPE24: NEGATIVE
URINE GLUCOSE: NEGATIVE
UROBILINOGEN UA: 0.2 (ref 0.0–1.0)
WBC UA: NONE SEEN (ref 0–?)
pH: 7.5 (ref 5.0–8.0)

## 2016-03-19 LAB — LIPID PANEL
CHOL/HDL RATIO: 3
Cholesterol: 136 mg/dL (ref 0–200)
HDL: 52.4 mg/dL (ref >39.00)
LDL Cholesterol: 73 mg/dL (ref 0–99)
NONHDL: 83.55
Triglycerides: 51 mg/dL (ref 0.0–149.0)
VLDL: 10.2 mg/dL (ref 0.0–40.0)

## 2016-03-19 LAB — BASIC METABOLIC PANEL
BUN: 14 mg/dL (ref 6–23)
CALCIUM: 9.1 mg/dL (ref 8.4–10.5)
CO2: 30 meq/L (ref 19–32)
CREATININE: 0.87 mg/dL (ref 0.40–1.20)
Chloride: 104 mEq/L (ref 96–112)
GFR: 85.64 mL/min (ref >60.00)
GLUCOSE: 73 mg/dL (ref 70–99)
Potassium: 3.4 mEq/L — ABNORMAL LOW (ref 3.5–5.1)
SODIUM: 140 meq/L (ref 135–145)

## 2016-03-19 LAB — HEMOGLOBIN A1C: HEMOGLOBIN A1C: 5.6 % (ref 4.6–6.5)

## 2016-03-19 LAB — TSH: TSH: 1.71 u[IU]/mL (ref 0.35–4.50)

## 2016-03-19 LAB — VITAMIN D 25 HYDROXY (VIT D DEFICIENCY, FRACTURES): VITD: 43.88 ng/mL (ref 30.00–100.00)

## 2016-03-19 MED ORDER — LOSARTAN POTASSIUM-HCTZ 100-25 MG PO TABS: 1.0000 | ORAL_TABLET | Freq: Every day | ORAL | 3 refills | Status: DC

## 2016-03-19 NOTE — Progress Notes (Signed)
we discussed code status.  pt requests full code. 

## 2016-03-19 NOTE — Patient Instructions (Addendum)
I have sent a prescription to your pharmacy, for a new blood pressure pill. blood tests are requested for you today.  We'll let you know about the results. Please consider these measures for your health:  minimize alcohol.  Do not use tobacco products.  Have a colonoscopy at least every 10 years from age 59.  Women should have an annual mammogram from age 36.  Keep firearms safely stored.  Always use seat belts.  have working smoke alarms in your home.  See an eye doctor and dentist regularly.  Never drive under the influence of alcohol or drugs (including prescription drugs).  It is critically important to prevent falling down (keep floor areas well-lit, dry, and free of loose objects.  If you have a cane, walker, or wheelchair, you should use it, even for short trips around the house.  Wear flat-soled shoes.  Also, try not to rush) Please come back for a blood pressure recheck in approx 1 month.

## 2016-03-20 LAB — HIV ANTIBODY (ROUTINE TESTING W REFLEX): HIV: NONREACTIVE

## 2016-03-21 ENCOUNTER — Other Ambulatory Visit: Payer: Self-pay | Admitting: Endocrinology

## 2016-04-18 ENCOUNTER — Telehealth: Payer: Self-pay

## 2016-04-18 ENCOUNTER — Ambulatory Visit: Payer: BC Managed Care – PPO

## 2016-04-18 VITALS — BP 144/86

## 2016-04-18 DIAGNOSIS — I10 Essential (primary) hypertension: Secondary | ICD-10-CM

## 2016-04-18 NOTE — Telephone Encounter (Signed)
Please continue the same medications BP recheck 1-2 mos

## 2016-04-18 NOTE — Telephone Encounter (Signed)
Patient came in for a BP check. Blood Pressure was 144/86.

## 2016-04-18 NOTE — Telephone Encounter (Signed)
Pateint notified of instructions during her BP check visit. Patient voiced understanding and scheduled 2 month BP check.

## 2016-05-03 ENCOUNTER — Other Ambulatory Visit: Payer: Self-pay | Admitting: Endocrinology

## 2016-05-03 DIAGNOSIS — Z1231 Encounter for screening mammogram for malignant neoplasm of breast: Secondary | ICD-10-CM

## 2016-05-14 ENCOUNTER — Other Ambulatory Visit: Payer: Self-pay

## 2016-06-08 ENCOUNTER — Ambulatory Visit
Admission: RE | Admit: 2016-06-08 | Discharge: 2016-06-08 | Disposition: A | Discharge status: Home / Self Care | Payer: BC Managed Care – PPO | Source: Ambulatory Visit | Attending: Endocrinology | Admitting: Endocrinology

## 2016-06-08 DIAGNOSIS — Z1231 Encounter for screening mammogram for malignant neoplasm of breast: Secondary | ICD-10-CM

## 2016-06-08 DIAGNOSIS — N951 Menopausal and female climacteric states: Secondary | ICD-10-CM

## 2016-06-19 ENCOUNTER — Ambulatory Visit: Payer: BC Managed Care – PPO | Admitting: Endocrinology

## 2016-06-19 ENCOUNTER — Ambulatory Visit: Payer: BC Managed Care – PPO

## 2016-06-19 VITALS — BP 136/84

## 2016-06-19 DIAGNOSIS — I10 Essential (primary) hypertension: Secondary | ICD-10-CM

## 2016-06-20 ENCOUNTER — Other Ambulatory Visit: Payer: Self-pay | Admitting: Endocrinology

## 2016-06-26 ENCOUNTER — Ambulatory Visit (INDEPENDENT_AMBULATORY_CARE_PROVIDER_SITE_OTHER): Payer: BC Managed Care – PPO | Admitting: Endocrinology

## 2016-06-26 ENCOUNTER — Telehealth: Payer: Self-pay | Admitting: Endocrinology

## 2016-06-26 ENCOUNTER — Encounter: Payer: Self-pay | Admitting: Endocrinology

## 2016-06-26 VITALS — BP 164/94 | HR 75 | Ht 68.0 in | Wt 219.0 lb

## 2016-06-26 DIAGNOSIS — I10 Essential (primary) hypertension: Secondary | ICD-10-CM | POA: Diagnosis not present

## 2016-06-26 MED ORDER — AMLODIPINE BESYLATE 5 MG PO TABS: 5.0000 mg | ORAL_TABLET | Freq: Every day | ORAL | 3 refills | Status: DC

## 2016-06-26 MED ORDER — SIMVASTATIN 20 MG PO TABS: 20.0000 mg | ORAL_TABLET | Freq: Every day | ORAL | 3 refills | Status: DC

## 2016-06-26 NOTE — Patient Instructions (Addendum)
I have sent a prescription to your pharmacy, for a new blood pressure pill.   Also, please reduce the cholesterol pill, due to an interaction.   You don't need medication for the osteoporosis, as it is mild.    Please come back for a blood pressure recheck in approx 2-4 weeks.

## 2016-06-26 NOTE — Telephone Encounter (Signed)
Refill submitted. 

## 2016-06-26 NOTE — Progress Notes (Signed)
Subjective:    Patient ID: Tonya Taylor, female    DOB: [birth year], 61 y.o.   MRN: [id number]  HPI Pt returns for f/u of HTN:  She takes hyzaar as rx'ed.  pt states she feels well in general. Past Medical History:  Diagnosis Date  . ANEMIA DUE TO CHRONIC BLOOD LOSS 04/05/2008  . Edema 01/02/2008  . HYPERLIPIDEMIA 02/01/2007  . HYPERTENSION 02/01/2007  . INSOMNIA 02/01/2007  . NEUTROPENIA UNSPECIFIED 01/31/2009  . Other chronic nonalcoholic liver disease 123456  . TOBACCO USE, QUIT 02/01/2007    No past surgical history on file.  Social History   Social History  . Marital status: Married    Spouse name: N/A  . Number of children: N/A  . Years of education: N/A   Occupational History  . Works for school system    Social History Main Topics  . Smoking status: Former Research scientist (life sciences)  . Smokeless tobacco: Never Used  . Alcohol use No  . Drug use: No  . Sexual activity: Not on file   Other Topics Concern  . Not on file   Social History Narrative  . No narrative on file    Current Outpatient Prescriptions on File Prior to Visit  Medication Sig Dispense Refill  . Ascorbic Acid (VITAMIN C) 1000 MG tablet Take 1,000 mg by mouth daily.      Marland Kitchen aspirin 81 MG tablet Take 81 mg by mouth daily.      . B Complex-C (SUPER B COMPLEX) TABS Take 1 tablet by mouth daily.      . Calcium Carbonate-Vit D-Min (CALCIUM 600+D PLUS MINERALS PO) Take 2 tablets by mouth daily.      . cetirizine (ZYRTEC) 10 MG tablet Take 10 mg by mouth daily.      . Cholecalciferol (D 5000) 5000 UNITS TABS Take 1 tablet by mouth daily.      . Coenzyme Q10 (COQ10 PO) Take 1 capsule by mouth daily.      Marland Kitchen ECHINACEA PO Take 1 capsule by mouth daily. 760mg      . losartan-hydrochlorothiazide (HYZAAR) 100-25 MG tablet Take 1 tablet by mouth daily. 90 tablet 3  . Magnesium 400 MG CAPS Take 1 capsule by mouth daily.      . Multiple Vitamin (MULTIVITAMIN) tablet Take 1 tablet by mouth daily.      . Omega-3 1400 MG CAPS  Take 1 capsule by mouth daily.      . vitamin B-12 (CYANOCOBALAMIN) 1000 MCG tablet Take 1,000 mcg by mouth daily.      . vitamin E 1000 UNIT capsule Take 1,000 Units by mouth daily.       No current facility-administered medications on file prior to visit.     No Known Allergies  Family History  Problem Relation Age of Onset  . Cancer Mother     Lung Cancer    BP (!) 164/94   Pulse 75   Ht 5\' 8"  (1.727 m)   Wt 219 lb (99.3 kg)   SpO2 97%   BMI 33.30 kg/m    Review of Systems Denies sob.     Objective:   Physical Exam VITAL SIGNS:  See vs page.  GENERAL: no distress.   Ext: no edema.       Assessment & Plan:  HTN: she needs increased rx.  Dyslipidemia: there is an interaction between zocor and norvasc.   Patient is advised the following: Patient Instructions  I have sent a prescription to your pharmacy, for  a new blood pressure pill.   Also, please reduce the cholesterol pill, due to an interaction.   You don't need medication for the osteoporosis, as it is mild.    Please come back for a blood pressure recheck in approx 2-4 weeks.

## 2016-06-26 NOTE — Telephone Encounter (Signed)
Patient ask Dr Loanne Drilling to send B/P medication  To the   [id number] [phone](Phone) [phone](Fax)   She did not know the name of it.

## 2016-07-24 ENCOUNTER — Ambulatory Visit (INDEPENDENT_AMBULATORY_CARE_PROVIDER_SITE_OTHER): Payer: BC Managed Care – PPO | Admitting: Endocrinology

## 2016-07-24 ENCOUNTER — Encounter: Payer: Self-pay | Admitting: Endocrinology

## 2016-07-24 DIAGNOSIS — J069 Acute upper respiratory infection, unspecified: Secondary | ICD-10-CM | POA: Diagnosis not present

## 2016-07-24 MED ORDER — AMOXICILLIN 500 MG PO CAPS: 500.0000 mg | ORAL_CAPSULE | Freq: Three times a day (TID) | ORAL | 0 refills | Status: DC

## 2016-07-24 NOTE — Patient Instructions (Addendum)
I have sent a prescription to your pharmacy, for an antibiotic for your ear.   Please continue the same flonase and amlodipine.  zyrtec-d (non-prescription) will help your congestion.

## 2016-07-24 NOTE — Progress Notes (Signed)
Subjective:    Patient ID: Tonya Taylor, female    DOB: 12-19-1955, 61 y.o.   MRN: [id number]  HPI Pt states few days of congestion in the nose, but no assoc earache.  She started flonase yesterday.  Past Medical History:  Diagnosis Date  . ANEMIA DUE TO CHRONIC BLOOD LOSS 04/05/2008  . Edema 01/02/2008  . HYPERLIPIDEMIA 02/01/2007  . HYPERTENSION 02/01/2007  . INSOMNIA 02/01/2007  . NEUTROPENIA UNSPECIFIED 01/31/2009  . Other chronic nonalcoholic liver disease 62/11/348  . TOBACCO USE, QUIT 02/01/2007    No past surgical history on file.  Social History   Social History  . Marital status: Married    Spouse name: N/A  . Number of children: N/A  . Years of education: N/A   Occupational History  . Works for school system    Social History Main Topics  . Smoking status: Former Research scientist (life sciences)  . Smokeless tobacco: Never Used  . Alcohol use No  . Drug use: No  . Sexual activity: Not on file   Other Topics Concern  . Not on file   Social History Narrative  . No narrative on file    Current Outpatient Prescriptions on File Prior to Visit  Medication Sig Dispense Refill  . amLODipine (NORVASC) 5 MG tablet Take 1 tablet (5 mg total) by mouth daily. 90 tablet 3  . Ascorbic Acid (VITAMIN C) 1000 MG tablet Take 1,000 mg by mouth daily.      Marland Kitchen aspirin 81 MG tablet Take 81 mg by mouth daily.      . B Complex-C (SUPER B COMPLEX) TABS Take 1 tablet by mouth daily.      . Calcium Carbonate-Vit D-Min (CALCIUM 600+D PLUS MINERALS PO) Take 2 tablets by mouth daily.      . cetirizine (ZYRTEC) 10 MG tablet Take 10 mg by mouth daily.      . Cholecalciferol (D 5000) 5000 UNITS TABS Take 1 tablet by mouth daily.      . Coenzyme Q10 (COQ10 PO) Take 1 capsule by mouth daily.      Marland Kitchen ECHINACEA PO Take 1 capsule by mouth daily. 760mg      . losartan-hydrochlorothiazide (HYZAAR) 100-25 MG tablet Take 1 tablet by mouth daily. 90 tablet 3  . Magnesium 400 MG CAPS Take 1 capsule by mouth daily.        . Multiple Vitamin (MULTIVITAMIN) tablet Take 1 tablet by mouth daily.      . Omega-3 1400 MG CAPS Take 1 capsule by mouth daily.      . simvastatin (ZOCOR) 20 MG tablet Take 1 tablet (20 mg total) by mouth at bedtime. 90 tablet 3  . vitamin B-12 (CYANOCOBALAMIN) 1000 MCG tablet Take 1,000 mcg by mouth daily.      . vitamin E 1000 UNIT capsule Take 1,000 Units by mouth daily.       No current facility-administered medications on file prior to visit.     No Known Allergies  Family History  Problem Relation Age of Onset  . Cancer Mother     Lung Cancer    BP 140/82   Pulse 75   Ht 5\' 8"  (1.727 m)   Wt 217 lb (98.4 kg)   SpO2 95%   BMI 32.99 kg/m   Review of Systems Denies leg edema.  She has slight rhinorrhea.      Objective:   Physical Exam VITAL SIGNS:  See vs page GENERAL: no distress head: no deformity  eyes: no  periorbital swelling, no proptosis  external nose and ears are normal  mouth: no lesion seen Left TM is red.  The right is normal.  Ext: no edema.      Assessment & Plan:  URI: new Allergic rhinitis, seasonal.  Patient is advised the following: Patient Instructions  I have sent a prescription to your pharmacy, for an antibiotic for your ear.   Please continue the same flonase and amlodipine.  zyrtec-d (non-prescription) will help your congestion.

## 2016-09-11 ENCOUNTER — Telehealth: Payer: Self-pay | Admitting: Endocrinology

## 2016-09-11 DIAGNOSIS — Z0000 Encounter for general adult medical examination without abnormal findings: Secondary | ICD-10-CM

## 2016-09-11 NOTE — Telephone Encounter (Signed)
Tonya Taylor Self [phone]  Kaely called to say she is past due for her Colonoscopy and she would like for [place] to schedule that. She would also like for [place] to renew her Handicap placard. She stated to call when this is done and she will have husband come by and pick up.

## 2016-09-11 NOTE — Telephone Encounter (Signed)
please call patient: Colonoscopy is not due for 6 more months.  Do you want to schedule now.  Also, I need to know what condition you need the handicapped form for.

## 2016-09-11 NOTE — Telephone Encounter (Signed)
Patient called and she does need a referral for the colonoscopy.  She also wants to be called when the [facility] form is signed because she has to leave work early to pick it up.

## 2016-09-12 NOTE — Telephone Encounter (Signed)
Pt does want to schedule now she thought she was supposed to have it done every 10 years and it was last done is 2006.  Pt needs the handicap form for because of her knee locking it up

## 2016-09-13 NOTE — Addendum Note (Signed)
Addended by: Renato Shin on: 09/13/2016 03:08 PM   Modules accepted: Orders

## 2016-09-13 NOTE — Telephone Encounter (Signed)
done

## 2016-09-14 ENCOUNTER — Telehealth: Payer: Self-pay

## 2016-09-14 NOTE — Telephone Encounter (Signed)
Handicap Placard form complete and ready for pick-up. LMTCB for pt.

## 2017-03-18 ENCOUNTER — Other Ambulatory Visit: Payer: Self-pay | Admitting: Endocrinology

## 2017-03-20 ENCOUNTER — Encounter: Payer: Self-pay | Admitting: Endocrinology

## 2017-03-20 ENCOUNTER — Ambulatory Visit (INDEPENDENT_AMBULATORY_CARE_PROVIDER_SITE_OTHER): Payer: BC Managed Care – PPO | Admitting: Endocrinology

## 2017-03-20 VITALS — BP 157/104 | HR 74 | Wt 223.6 lb

## 2017-03-20 DIAGNOSIS — M545 Low back pain, unspecified: Secondary | ICD-10-CM

## 2017-03-20 DIAGNOSIS — E559 Vitamin D deficiency, unspecified: Secondary | ICD-10-CM | POA: Diagnosis not present

## 2017-03-20 DIAGNOSIS — M549 Dorsalgia, unspecified: Secondary | ICD-10-CM | POA: Insufficient documentation

## 2017-03-20 DIAGNOSIS — D500 Iron deficiency anemia secondary to blood loss (chronic): Secondary | ICD-10-CM

## 2017-03-20 DIAGNOSIS — Z23 Encounter for immunization: Secondary | ICD-10-CM

## 2017-03-20 DIAGNOSIS — Z0000 Encounter for general adult medical examination without abnormal findings: Secondary | ICD-10-CM | POA: Diagnosis not present

## 2017-03-20 MED ORDER — FLUTICASONE PROPIONATE 50 MCG/ACT NA SUSP: 2.0000 | Freq: Every day | NASAL | 11 refills | Status: DC

## 2017-03-20 NOTE — Progress Notes (Signed)
Subjective:    Patient ID: Tonya Taylor, female    DOB: 03-10-1956, 61 y.o.   MRN: [id number]  HPI Pt is here for regular wellness examination, and is feeling pretty well in general, and says chronic med probs are stable, except as noted below Past Medical History:  Diagnosis Date  . ANEMIA DUE TO CHRONIC BLOOD LOSS 04/05/2008  . Edema 01/02/2008  . HYPERLIPIDEMIA 02/01/2007  . HYPERTENSION 02/01/2007  . INSOMNIA 02/01/2007  . NEUTROPENIA UNSPECIFIED 01/31/2009  . Other chronic nonalcoholic liver disease 62/12/3149  . TOBACCO USE, QUIT 02/01/2007    History reviewed. No pertinent surgical history.  Social History   Socioeconomic History  . Marital status: Married    Spouse name: Not on file  . Number of children: Not on file  . Years of education: Not on file  . Highest education level: Not on file  Social Needs  . Financial resource strain: Not on file  . Food insecurity - worry: Not on file  . Food insecurity - inability: Not on file  . Transportation needs - medical: Not on file  . Transportation needs - non-medical: Not on file  Occupational History  . Occupation: Works for school system  Tobacco Use  . Smoking status: Former Research scientist (life sciences)  . Smokeless tobacco: Never Used  Substance and Sexual Activity  . Alcohol use: No    Alcohol/week: 0.0 oz  . Drug use: No  . Sexual activity: Not on file  Other Topics Concern  . Not on file  Social History Narrative  . Not on file    Current Outpatient Medications on File Prior to Visit  Medication Sig Dispense Refill  . amLODipine (NORVASC) 5 MG tablet Take 1 tablet (5 mg total) by mouth daily. 90 tablet 3  . Ascorbic Acid (VITAMIN C) 1000 MG tablet Take 1,000 mg by mouth daily.      Marland Kitchen aspirin 81 MG tablet Take 81 mg by mouth daily.      . B Complex-C (SUPER B COMPLEX) TABS Take 1 tablet by mouth daily.      . Calcium Carbonate-Vit D-Min (CALCIUM 600+D PLUS MINERALS PO) Take 2 tablets by mouth daily.      . cetirizine  (ZYRTEC) 10 MG tablet Take 10 mg by mouth daily.      . Cholecalciferol (D 5000) 5000 UNITS TABS Take 1 tablet by mouth daily.      . Coenzyme Q10 (COQ10 PO) Take 1 capsule by mouth daily.      Marland Kitchen ECHINACEA PO Take 1 capsule by mouth daily. 760mg      . guaiFENesin (MUCINEX) 600 MG 12 hr tablet Take by mouth 2 (two) times daily.    Marland Kitchen losartan-hydrochlorothiazide (HYZAAR) 100-25 MG tablet TAKE 1 TABLET BY MOUTH DAILY. 90 tablet 3  . Magnesium 400 MG CAPS Take 1 capsule by mouth daily.      . Multiple Vitamin (MULTIVITAMIN) tablet Take 1 tablet by mouth daily.      . Omega-3 1400 MG CAPS Take 1 capsule by mouth daily.      . simvastatin (ZOCOR) 20 MG tablet Take 1 tablet (20 mg total) by mouth at bedtime. 90 tablet 3  . vitamin B-12 (CYANOCOBALAMIN) 1000 MCG tablet Take 1,000 mcg by mouth daily.      . vitamin E 1000 UNIT capsule Take 1,000 Units by mouth daily.       No current facility-administered medications on file prior to visit.     No Known Allergies  Family History  Problem Relation Age of Onset  . Cancer Mother        Lung Cancer    BP (!) 157/104 (BP Location: Left Arm, Cuff Size: Normal)   Pulse 74   Wt 223 lb 9.6 oz (101.4 kg)   SpO2 95%   BMI 34.00 kg/m    Review of Systems Denies fever, visual loss, hearing loss, chest pain, sob, depression, cold intolerance, BRBPR, hematuria, syncope, easy bruising, and rash.  She has fatigue and rhinorrhea.    Objective:   Physical Exam VS: see vs page.  GEN: no distress HEAD: head: no deformity eyes: no periorbital swelling, no proptosis external nose and ears are normal mouth: no lesion seen NECK: supple, thyroid is not enlarged CHEST WALL: no deformity LUNGS:  Clear to auscultation BREASTS: sees gyn CV: reg rate and rhythm, no murmur ABD: abdomen is soft, nontender.  no hepatosplenomegaly.  not distended.  no hernia.  GENITALIA/RECTAL: sees gyn MUSCULOSKELETAL: muscle bulk and strength are grossly normal.  no obvious  joint swelling.  gait is normal and steady EXTEMITIES: no deformity.  no ulcer on the feet.  feet are of normal color and temp.  no edema PULSES: dorsalis pedis intact bilat.  no carotid bruit NEURO:  cn 2-12 grossly intact.   readily moves all 4's.  sensation is intact to touch on the feet SKIN:  Normal texture and temperature.  No rash or suspicious lesion is visible.   NODES:  None palpable at the neck PSYCH: alert, well-oriented.  Does not appear anxious nor depressed.    I personally reviewed electrocardiogram tracing (today): Indication:  Impression: NSR.  No MI.  No hypertrophy.  NS-TWA.  Compared to: no significant change     Assessment & Plan:  Wellness visit today, with problems stable, except as noted.  SEPARATE EVALUATION FOLLOWS--EACH PROBLEM HERE IS NEW, NOT RESPONDING TO TREATMENT, OR POSES SIGNIFICANT RISK TO THE PATIENT'S HEALTH: HISTORY OF THE PRESENT ILLNESS: Pt states few weeks of moderate pain at the lower back, to the right of the midline.  No assoc bowel or bladder retention.  No local injury.   PAST MEDICAL HISTORY Past Medical History:  Diagnosis Date  . ANEMIA DUE TO CHRONIC BLOOD LOSS 04/05/2008  . Edema 01/02/2008  . HYPERLIPIDEMIA 02/01/2007  . HYPERTENSION 02/01/2007  . INSOMNIA 02/01/2007  . NEUTROPENIA UNSPECIFIED 01/31/2009  . Other chronic nonalcoholic liver disease 06/13/7410  . TOBACCO USE, QUIT 02/01/2007    History reviewed. No pertinent surgical history.  Social History   Socioeconomic History  . Marital status: Married    Spouse name: Not on file  . Number of children: Not on file  . Years of education: Not on file  . Highest education level: Not on file  Social Needs  . Financial resource strain: Not on file  . Food insecurity - worry: Not on file  . Food insecurity - inability: Not on file  . Transportation needs - medical: Not on file  . Transportation needs - non-medical: Not on file  Occupational History  . Occupation: Works  for school system  Tobacco Use  . Smoking status: Former Research scientist (life sciences)  . Smokeless tobacco: Never Used  Substance and Sexual Activity  . Alcohol use: No    Alcohol/week: 0.0 oz  . Drug use: No  . Sexual activity: Not on file  Other Topics Concern  . Not on file  Social History Narrative  . Not on file    Current Outpatient Medications on  File Prior to Visit  Medication Sig Dispense Refill  . amLODipine (NORVASC) 5 MG tablet Take 1 tablet (5 mg total) by mouth daily. 90 tablet 3  . Ascorbic Acid (VITAMIN C) 1000 MG tablet Take 1,000 mg by mouth daily.      Marland Kitchen aspirin 81 MG tablet Take 81 mg by mouth daily.      . B Complex-C (SUPER B COMPLEX) TABS Take 1 tablet by mouth daily.      . Calcium Carbonate-Vit D-Min (CALCIUM 600+D PLUS MINERALS PO) Take 2 tablets by mouth daily.      . cetirizine (ZYRTEC) 10 MG tablet Take 10 mg by mouth daily.      . Cholecalciferol (D 5000) 5000 UNITS TABS Take 1 tablet by mouth daily.      . Coenzyme Q10 (COQ10 PO) Take 1 capsule by mouth daily.      Marland Kitchen ECHINACEA PO Take 1 capsule by mouth daily. 760mg      . guaiFENesin (MUCINEX) 600 MG 12 hr tablet Take by mouth 2 (two) times daily.    Marland Kitchen losartan-hydrochlorothiazide (HYZAAR) 100-25 MG tablet TAKE 1 TABLET BY MOUTH DAILY. 90 tablet 3  . Magnesium 400 MG CAPS Take 1 capsule by mouth daily.      . Multiple Vitamin (MULTIVITAMIN) tablet Take 1 tablet by mouth daily.      . Omega-3 1400 MG CAPS Take 1 capsule by mouth daily.      . simvastatin (ZOCOR) 20 MG tablet Take 1 tablet (20 mg total) by mouth at bedtime. 90 tablet 3  . vitamin B-12 (CYANOCOBALAMIN) 1000 MCG tablet Take 1,000 mcg by mouth daily.      . vitamin E 1000 UNIT capsule Take 1,000 Units by mouth daily.       No current facility-administered medications on file prior to visit.     No Known Allergies  Family History  Problem Relation Age of Onset  . Cancer Mother        Lung Cancer    BP (!) 157/104 (BP Location: Left Arm, Cuff Size:  Normal)   Pulse 74   Wt 223 lb 9.6 oz (101.4 kg)   SpO2 95%   BMI 34.00 kg/m   REVIEW OF SYSTEMS: Denies numbess PHYSICAL EXAMINATION: VITAL SIGNS:  See vs page GENERAL: no distress Spine: nontender LAB/XRAY RESULTS: IMPRESSION: Low-back pain, new HTN: prob situational PLAN:  Check x-rays

## 2017-03-20 NOTE — Patient Instructions (Addendum)
blood tests and x-rays are requested for you today.  We'll let you know about the results. Please consider these measures for your health:  minimize alcohol.  Do not use tobacco products.  Have a colonoscopy at least every 10 years from age 60.  Women should have an annual mammogram from age 67.  Keep firearms safely stored.  Always use seat belts.  have working smoke alarms in your home.  See an eye doctor and dentist regularly.  Never drive under the influence of alcohol or drugs (including prescription drugs).   Please continue the same medications.  Please come back for a follow-up appointment in 1 year.

## 2017-03-21 ENCOUNTER — Ambulatory Visit
Admission: RE | Admit: 2017-03-21 | Discharge: 2017-03-21 | Disposition: A | Discharge status: Home / Self Care | Payer: BC Managed Care – PPO | Source: Ambulatory Visit | Attending: Endocrinology | Admitting: Endocrinology

## 2017-03-21 DIAGNOSIS — M545 Low back pain, unspecified: Secondary | ICD-10-CM

## 2017-03-21 LAB — BASIC METABOLIC PANEL
BUN: 15 mg/dL (ref 6–23)
CALCIUM: 9.4 mg/dL (ref 8.4–10.5)
CO2: 31 mEq/L (ref 19–32)
Chloride: 103 mEq/L (ref 96–112)
Creatinine, Ser: 0.91 mg/dL (ref 0.40–1.20)
GFR: 81.03 mL/min (ref >60.00)
GLUCOSE: 61 mg/dL — AB (ref 70–99)
Potassium: 3.7 mEq/L (ref 3.5–5.1)
SODIUM: 141 meq/L (ref 135–145)

## 2017-03-21 LAB — HEPATIC FUNCTION PANEL
ALK PHOS: 50 U/L (ref 39–117)
ALT: 33 U/L (ref 0–35)
AST: 36 U/L (ref 0–37)
Albumin: 4 g/dL (ref 3.5–5.2)
BILIRUBIN DIRECT: 0.1 mg/dL (ref 0.0–0.3)
BILIRUBIN TOTAL: 0.4 mg/dL (ref 0.2–1.2)
Total Protein: 7.4 g/dL (ref 6.0–8.3)

## 2017-03-21 LAB — LIPID PANEL
CHOLESTEROL: 147 mg/dL (ref 0–200)
HDL: 57.4 mg/dL (ref >39.00)
LDL Cholesterol: 75 mg/dL (ref 0–99)
NONHDL: 89.65
Total CHOL/HDL Ratio: 3
Triglycerides: 72 mg/dL (ref 0.0–149.0)
VLDL: 14.4 mg/dL (ref 0.0–40.0)

## 2017-03-21 LAB — URINALYSIS, ROUTINE W REFLEX MICROSCOPIC
Bilirubin Urine: NEGATIVE
Hgb urine dipstick: NEGATIVE
KETONES UR: NEGATIVE
LEUKOCYTES UA: NEGATIVE
NITRITE: NEGATIVE
RBC / HPF: NONE SEEN (ref 0–?)
TOTAL PROTEIN, URINE-UPE24: NEGATIVE
URINE GLUCOSE: NEGATIVE
Urobilinogen, UA: 0.2 (ref 0.0–1.0)
pH: 6 (ref 5.0–8.0)

## 2017-03-21 LAB — IBC PANEL
Iron: 61 ug/dL (ref 42–145)
Saturation Ratios: 17.6 % — ABNORMAL LOW (ref 20.0–50.0)
Transferrin: 248 mg/dL (ref 212.0–360.0)

## 2017-03-21 LAB — SEDIMENTATION RATE: SED RATE: 20 mm/h (ref 0–30)

## 2017-03-21 LAB — VITAMIN D 25 HYDROXY (VIT D DEFICIENCY, FRACTURES): VITD: 36.7 ng/mL (ref 30.00–100.00)

## 2017-03-21 LAB — TSH: TSH: 2.01 u[IU]/mL (ref 0.35–4.50)

## 2017-03-25 ENCOUNTER — Encounter: Payer: Self-pay | Admitting: Gastroenterology

## 2017-03-25 LAB — PTH, INTACT AND CALCIUM
CALCIUM: 9.2 mg/dL (ref 8.6–10.4)
PTH: 48 pg/mL (ref 14–64)

## 2017-03-26 ENCOUNTER — Encounter: Payer: Self-pay | Admitting: Gastroenterology

## 2017-05-02 ENCOUNTER — Encounter: Payer: Self-pay | Admitting: Endocrinology

## 2017-05-15 ENCOUNTER — Ambulatory Visit (AMBULATORY_SURGERY_CENTER): Payer: Self-pay | Admitting: *Deleted

## 2017-05-15 ENCOUNTER — Other Ambulatory Visit: Payer: Self-pay

## 2017-05-15 VITALS — Ht 68.0 in | Wt 220.0 lb

## 2017-05-15 DIAGNOSIS — Z1211 Encounter for screening for malignant neoplasm of colon: Secondary | ICD-10-CM

## 2017-05-15 MED ORDER — PEG 3350-KCL-NA BICARB-NACL 420 G PO SOLR: 4000.0000 mL | Freq: Once | ORAL | 0 refills | Status: AC

## 2017-05-15 NOTE — Progress Notes (Signed)
No egg or soy allergy known to patient  No issues with past sedation with any surgeries  or procedures, no intubation problems  No diet pills per patient No home 02 use per patient  No blood thinners per patient  Pt denies issues with constipation  No A fib or A flutter  EMMI video sent to pt's e mail  

## 2017-05-17 ENCOUNTER — Encounter: Payer: Self-pay | Admitting: Gastroenterology

## 2017-05-24 ENCOUNTER — Encounter: Payer: Self-pay | Admitting: Gastroenterology

## 2017-05-24 ENCOUNTER — Ambulatory Visit (AMBULATORY_SURGERY_CENTER): Payer: BC Managed Care – PPO | Admitting: Gastroenterology

## 2017-05-24 ENCOUNTER — Other Ambulatory Visit: Payer: Self-pay

## 2017-05-24 VITALS — BP 127/85 | HR 62 | Temp 97.8°F | Resp 11 | Ht 68.0 in | Wt 220.0 lb

## 2017-05-24 DIAGNOSIS — D120 Benign neoplasm of cecum: Secondary | ICD-10-CM

## 2017-05-24 DIAGNOSIS — Z1211 Encounter for screening for malignant neoplasm of colon: Secondary | ICD-10-CM | POA: Diagnosis not present

## 2017-05-24 DIAGNOSIS — D126 Benign neoplasm of colon, unspecified: Secondary | ICD-10-CM | POA: Diagnosis not present

## 2017-05-24 DIAGNOSIS — Z1212 Encounter for screening for malignant neoplasm of rectum: Secondary | ICD-10-CM

## 2017-05-24 MED ORDER — SODIUM CHLORIDE 0.9 % IV SOLN: 500.0000 mL | Freq: Once | INTRAVENOUS | Status: DC

## 2017-05-24 NOTE — Op Note (Signed)
[facility] Patient Name: Tonya Taylor Procedure Date: 05/24/2017 1:43 PM MRN: [id number] Endoscopist: Milus Banister , MD Age: 62 Referring MD:  Date of Birth: October 07, 1955 Gender: Female Account #: [id number] Procedure:                Colonoscopy Indications:              Screening for colorectal malignant neoplasm;                            colonoscopy 2008 was normal Medicines:                Monitored Anesthesia Care Procedure:                Pre-Anesthesia Assessment:                           - Prior to the procedure, a History and Physical                            was performed, and patient medications and                            allergies were reviewed. The patient's tolerance of                            previous anesthesia was also reviewed. The risks                            and benefits of the procedure and the sedation                            options and risks were discussed with the patient.                            All questions were answered, and informed consent                            was obtained. Prior Anticoagulants: The patient has                            taken no previous anticoagulant or antiplatelet                            agents. ASA Grade Assessment: II - A patient with                            mild systemic disease. After reviewing the risks                            and benefits, the patient was deemed in                            satisfactory condition to undergo the procedure.  After obtaining informed consent, the colonoscope                            was passed under direct vision. Throughout the                            procedure, the patient's blood pressure, pulse, and                            oxygen saturations were monitored continuously. The                            Colonoscope was introduced through the anus and                            advanced to the the terminal ileum.  The colonoscopy                            was performed without difficulty. The patient                            tolerated the procedure well. The quality of the                            bowel preparation was good. The terminal ileum,                            ileocecal valve, appendiceal orifice, and rectum                            were photographed. Scope In: 1:47:50 PM Scope Out: 2:00:40 PM Scope Withdrawal Time: 0 hours 11 minutes 18 seconds  Total Procedure Duration: 0 hours 12 minutes 50 seconds  Findings:                 There were a few small erosions on the distal                            aspect of the IC valve. Biopsies were taken with a                            cold forceps for histology.                           Normal terminal ileum.                           The exam was otherwise without abnormality on                            direct and retroflexion views. Complications:            No immediate complications. Estimated blood loss:  None. Estimated Blood Loss:     Estimated blood loss: none. Impression:               - A few small erosions on the IC valve (NSAID                            related?), biopsied                           - Terminal ileum was normal.                           - The examination was otherwise normal on direct                            and retroflexion views.                           - No polyps or cancers. Recommendation:           - Patient has a contact number available for                            emergencies. The signs and symptoms of potential                            delayed complications were discussed with the                            patient. Return to normal activities tomorrow.                            Written discharge instructions were provided to the                            patient.                           - Resume previous diet.                           - Await final  pathology.                           - Continue present medications.                           - Repeat colonoscopy in 10 years for screening                            purposes. Milus Banister, MD 05/24/2017 2:05:44 PM This report has been signed electronically.

## 2017-05-24 NOTE — Progress Notes (Signed)
Pt fell Thurs, May 16, 2017.  Seen at [facility] on Friday, May 17, 2017.  Saturday, May 18, 2017 [facility] pt reported they could not tell if wrist was fx or broken.  Pt has a black brace to wear.  She took it off and said she wears it while at work and takes if off while at home.  Pt took it off and okayed her IV to go in her right hand. Pt reported no other changes to her medical or surgical hx. maw

## 2017-05-24 NOTE — Progress Notes (Signed)
Called to room to assist during endoscopic procedure.  Patient ID and intended procedure confirmed with present staff. Received instructions for my participation in the procedure from the performing physician.  

## 2017-05-24 NOTE — Patient Instructions (Signed)
YOU HAD AN ENDOSCOPIC PROCEDURE TODAY AT THE  ENDOSCOPY CENTER:   Refer to the procedure report that was given to you for any specific questions about what was found during the examination.  If the procedure report does not answer your questions, please call your gastroenterologist to clarify.  If you requested that your care partner not be given the details of your procedure findings, then the procedure report has been included in a sealed envelope for you to review at your convenience later.  YOU SHOULD EXPECT: Some feelings of bloating in the abdomen. Passage of more gas than usual.  Walking can help get rid of the air that was put into your GI tract during the procedure and reduce the bloating. If you had a lower endoscopy (such as a colonoscopy or flexible sigmoidoscopy) you may notice spotting of blood in your stool or on the toilet paper. If you underwent a bowel prep for your procedure, you may not have a normal bowel movement for a few days.  Please Note:  You might notice some irritation and congestion in your nose or some drainage.  This is from the oxygen used during your procedure.  There is no need for concern and it should clear up in a day or so.  SYMPTOMS TO REPORT IMMEDIATELY:   Following lower endoscopy (colonoscopy or flexible sigmoidoscopy):  Excessive amounts of blood in the stool  Significant tenderness or worsening of abdominal pains  Swelling of the abdomen that is new, acute  Fever of 100F or higher   For urgent or emergent issues, a gastroenterologist can be reached at any hour by calling [phone].   DIET:  We do recommend a small meal at first, but then you may proceed to your regular diet.  Drink plenty of fluids but you should avoid alcoholic beverages for 24 hours.  ACTIVITY:  You should plan to take it easy for the rest of today and you should NOT DRIVE or use heavy machinery until tomorrow (because of the sedation medicines used during the test).     FOLLOW UP: Our staff will call the number listed on your records the next business day following your procedure to check on you and address any questions or concerns that you may have regarding the information given to you following your procedure. If we do not reach you, we will leave a message.  However, if you are feeling well and you are not experiencing any problems, there is no need to return our call.  We will assume that you have returned to your regular daily activities without incident.  If any biopsies were taken you will be contacted by phone or by letter within the next 1-3 weeks.  Please call us at [phone] if you have not heard about the biopsies in 3 weeks.    SIGNATURES/CONFIDENTIALITY: You and/or your care partner have signed paperwork which will be entered into your electronic medical record.  These signatures attest to the fact that that the information above on your After Visit Summary has been reviewed and is understood.  Full responsibility of the confidentiality of this discharge information lies with you and/or your care-partner.   Thank you for allowing us to provide your healthcare today.  

## 2017-05-24 NOTE — Progress Notes (Signed)
To recovery, report to RN, VSS. 

## 2017-05-27 ENCOUNTER — Telehealth: Payer: Self-pay | Admitting: *Deleted

## 2017-05-27 ENCOUNTER — Other Ambulatory Visit: Payer: Self-pay | Admitting: Endocrinology

## 2017-05-27 DIAGNOSIS — Z139 Encounter for screening, unspecified: Secondary | ICD-10-CM

## 2017-05-27 NOTE — Telephone Encounter (Signed)
  Follow up Call-  Call back number 05/24/2017  Post procedure Call Back phone  # [phone] hm  Permission to leave phone message Yes  Some recent data might be hidden     Patient questions:  Do you have a fever, pain , or abdominal swelling? No. Pain Score  0 *  Have you tolerated food without any problems? Yes.    Have you been able to return to your normal activities? Yes.    Do you have any questions about your discharge instructions: Diet   No. Medications  No. Follow up visit  No.  Do you have questions or concerns about your Care? No.  Actions: * If pain score is 4 or above: No action needed, pain <4.

## 2017-06-03 ENCOUNTER — Other Ambulatory Visit: Payer: Self-pay | Admitting: Endocrinology

## 2017-06-03 MED ORDER — OLMESARTAN MEDOXOMIL-HCTZ 40-25 MG PO TABS: 1.0000 | ORAL_TABLET | Freq: Every day | ORAL | 3 refills | Status: DC

## 2017-06-06 ENCOUNTER — Encounter: Payer: Self-pay | Admitting: Gastroenterology

## 2017-06-13 ENCOUNTER — Other Ambulatory Visit: Payer: Self-pay

## 2017-06-13 MED ORDER — TELMISARTAN-HCTZ 80-25 MG PO TABS: 1.0000 | ORAL_TABLET | Freq: Every day | ORAL | 0 refills | Status: DC

## 2017-06-17 ENCOUNTER — Ambulatory Visit
Admission: RE | Admit: 2017-06-17 | Discharge: 2017-06-17 | Disposition: A | Discharge status: Home / Self Care | Payer: BC Managed Care – PPO | Source: Ambulatory Visit | Attending: Endocrinology | Admitting: Endocrinology

## 2017-06-17 DIAGNOSIS — Z139 Encounter for screening, unspecified: Secondary | ICD-10-CM

## 2017-06-23 ENCOUNTER — Encounter: Payer: Self-pay | Admitting: Endocrinology

## 2017-07-01 MED ORDER — TELMISARTAN-HCTZ 80-25 MG PO TABS: 1.0000 | ORAL_TABLET | Freq: Every day | ORAL | 0 refills | Status: DC

## 2017-07-04 ENCOUNTER — Encounter (HOSPITAL_BASED_OUTPATIENT_CLINIC_OR_DEPARTMENT_OTHER): Payer: Self-pay | Admitting: *Deleted

## 2017-07-04 ENCOUNTER — Other Ambulatory Visit: Payer: Self-pay | Admitting: Orthopedic Surgery

## 2017-07-05 ENCOUNTER — Encounter (HOSPITAL_BASED_OUTPATIENT_CLINIC_OR_DEPARTMENT_OTHER)
Admission: RE | Admit: 2017-07-05 | Discharge: 2017-07-05 | Disposition: A | Discharge status: Home / Self Care | Payer: BC Managed Care – PPO | Source: Ambulatory Visit | Attending: Orthopedic Surgery | Admitting: Orthopedic Surgery

## 2017-07-05 DIAGNOSIS — Z87891 Personal history of nicotine dependence: Secondary | ICD-10-CM | POA: Diagnosis not present

## 2017-07-05 DIAGNOSIS — H409 Unspecified glaucoma: Secondary | ICD-10-CM | POA: Diagnosis not present

## 2017-07-05 DIAGNOSIS — Z801 Family history of malignant neoplasm of trachea, bronchus and lung: Secondary | ICD-10-CM | POA: Diagnosis not present

## 2017-07-05 DIAGNOSIS — S6991XA Unspecified injury of right wrist, hand and finger(s), initial encounter: Secondary | ICD-10-CM | POA: Diagnosis present

## 2017-07-05 DIAGNOSIS — E785 Hyperlipidemia, unspecified: Secondary | ICD-10-CM | POA: Diagnosis not present

## 2017-07-05 DIAGNOSIS — W010XXA Fall on same level from slipping, tripping and stumbling without subsequent striking against object, initial encounter: Secondary | ICD-10-CM | POA: Diagnosis not present

## 2017-07-05 DIAGNOSIS — M8580 Other specified disorders of bone density and structure, unspecified site: Secondary | ICD-10-CM | POA: Diagnosis not present

## 2017-07-05 DIAGNOSIS — Z8042 Family history of malignant neoplasm of prostate: Secondary | ICD-10-CM | POA: Diagnosis not present

## 2017-07-05 DIAGNOSIS — I10 Essential (primary) hypertension: Secondary | ICD-10-CM | POA: Diagnosis not present

## 2017-07-05 DIAGNOSIS — Y939 Activity, unspecified: Secondary | ICD-10-CM | POA: Diagnosis not present

## 2017-07-05 DIAGNOSIS — S62001A Unspecified fracture of navicular [scaphoid] bone of right wrist, initial encounter for closed fracture: Secondary | ICD-10-CM | POA: Diagnosis not present

## 2017-07-05 LAB — BASIC METABOLIC PANEL
Anion gap: 11 (ref 5–15)
BUN: 12 mg/dL (ref 6–20)
CALCIUM: 8.6 mg/dL — AB (ref 8.9–10.3)
CO2: 25 mmol/L (ref 22–32)
CREATININE: 0.8 mg/dL (ref 0.44–1.00)
Chloride: 103 mmol/L (ref 101–111)
GFR calc Af Amer: >60 mL/min (ref >60)
GFR calc non Af Amer: >60 mL/min (ref >60)
GLUCOSE: 91 mg/dL (ref 65–99)
Potassium: 4 mmol/L (ref 3.5–5.1)
SODIUM: 139 mmol/L (ref 135–145)

## 2017-07-05 NOTE — Progress Notes (Signed)
EKG  reviewed by Dr. Rose, will proceed with surgery as scheduled.  

## 2017-07-07 NOTE — Op Note (Signed)
07/08/2017  8:33 PM  PATIENT:  Tonya Taylor  62 y.o. female  PRE-OPERATIVE DIAGNOSIS:  Right scaphoid fracture  POST-OPERATIVE DIAGNOSIS:  Same  PROCEDURE:  ORIF R scaphoid fx  SURGEON: Rayvon Char. Grandville Silos, MD  PHYSICIAN ASSISTANT: Morley Kos, OPA-C  ANESTHESIA:  regional and general  SPECIMENS:  None  DRAINS:   None  EBL:  less than 50 mL  PREOPERATIVE INDICATIONS:  [name] is a  62 y.o. female with a right scaphoid fx that has regressed radiographically over the past 6 weeks.  The risks benefits and alternatives were discussed with the patient preoperatively including but not limited to the risks of infection, bleeding, nerve injury, cardiopulmonary complications, the need for revision surgery, among others, and the patient verbalized understanding and consented to proceed.  OPERATIVE IMPLANTS: mini Acutrak screw, 54mm  OPERATIVE PROCEDURE:  After receiving prophylactic antibiotics, the patient was escorted to the operative theatre and placed in a supine position.  A surgical "time-out" was performed during which the planned procedure, proposed operative site, and the correct patient identity were compared to the operative consent and agreement confirmed by the circulating nurse according to current facility policy.  Following application of a tourniquet to the operative extremity, the exposed skin was prepped with Chloraprep and draped in the usual sterile fashion.  The limb was exsanguinated with an Esmarch bandage and the tourniquet inflated to approximately 171mmHg higher than systolic BP.  A small linear incision less than a centimeter of length was made on the dorsum of the wrist, overlying the scapholunate interval.  The extensor retinaculum was released for a centimeter, exposing the third compartment tendon, which was retracted.  A 16-gauge needle was then placed through the capsule to find the desired guidepin insertion point on the scaphoid proximal  pole.  This was confirmed fluoroscopically and then the K wire guidepin inserted.  Its proper orientation and course was checked radiographically during insertion.  Screw length was measured to be 22, so an 18 mm screw was selected.  The cannulated drill was then used to over drill the guidewire, keeping the guidewire in place and the screw was inserted.  He had good tightness and compression as it near to its final insertion point.  It was fully contained within the scaphoid and across the fracture line nearly perpendicularly.  Final images were obtained.  Tourniquet was released, the wound irrigated and the skin closed with 4-0 Vicryl Rapide deep subcuticular interrupted sutures.  A short arm splint dressing was applied and she was awakened and taken to the recovery room stable condition, breathing spontaneously.  DISPOSITION: She will be discharged home today, returning to work next Monday, returning to the office in 10-15 days with new x-rays of the right wrist out of splint, including a scaphoid view, and likely converting to a removable forearm-based thumb spica splint with the radial stay removed

## 2017-07-07 NOTE — H&P (Addendum)
Tonya Taylor is an 62 y.o. female.   CC / Reason for Visit: Right wrist problem HPI: Patient returns to clinic today after having been in a cast for 1 month.  Cast is intact with little wear and tear.  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  She had x-rays and then a subsequent CT scan which demonstrated a nondisplaced scaphoid fracture.  She has been utilizing a thumb spica splint, but removing it in the evenings.  She is here today at the request of Dr. Mayer Camel for further evaluation and treatment.   Past Medical History:  Diagnosis Date  . Allergy   . ANEMIA DUE TO CHRONIC BLOOD LOSS 04/05/2008  . Edema 01/02/2008  . Glaucoma   . HYPERLIPIDEMIA 02/01/2007  . HYPERTENSION 02/01/2007  . INSOMNIA 02/01/2007  . NEUTROPENIA UNSPECIFIED 01/31/2009  . Osteopenia   . Other chronic nonalcoholic liver disease 96/06/9526  . TOBACCO USE, QUIT 02/01/2007    Past Surgical History:  Procedure Laterality Date  . COLONOSCOPY    . DENTAL SURGERY    . HAND SURGERY     removed a nerve   . KNEE ARTHROSCOPY Left     Family History  Problem Relation Age of Onset  . Cancer Mother        Lung Cancer  . Lung cancer Mother   . Prostate cancer Father   . Colon cancer Neg Hx   . Esophageal cancer Neg Hx   . Liver cancer Neg Hx   . Pancreatic cancer Neg Hx   . Rectal cancer Neg Hx   . Stomach cancer Neg Hx    Social History:  reports that she has quit smoking. Her smoking use included cigarettes. she has never used smokeless tobacco. She reports that she does not drink alcohol or use drugs.  Allergies: No Known Allergies  No medications prior to admission.    No results found for this or any previous visit (from the past 48 hour(s)). No results found.  Review of Systems  All other systems reviewed and are negative.   Height 5\' 8"   (1.727 m), weight 99.8 kg (220 lb). Physical Exam  Constitutional:  WD, WN, NAD HEENT:  NCAT, EOMI Neuro/Psych:  Alert & oriented to person, place, and time; appropriate mood & affect Lymphatic: No generalized UE edema or lymphadenopathy Extremities / MSK:  Both UE are normal with respect to appearance, ranges of motion, joint stability, muscle strength/tone, sensation, & perfusion except as otherwise noted:  Cast is removed. The right wrist is not swollen, discolored, or deformed.  There is still TTP over the anatomical snuffbox.  There is full digital range of motion and the patient is NVI.  Labs / Xrays:  4 views of the right wrist including a scaphoid view demonstrate a closed, extra-articular, nondisplaced, transverse fracture of the waist of the scaphoid with increased lucency at the fracture site when compared to images on 05/16/2017.  3D CT scan performed on 05/27/2017 demonstrated scaphoid waist fracture, also without displacement.  Please see the imaging report for further information.  Assessment: Left wrist scaphoid fracture -more prominent and visible vs xrays 1 month ago  Plan:  The findings are discussed with the patient.  It is felt at this time, for the best possible outcome, given the adverse interval changes, this patient should undergo surgical open treatment of the scaphoid fracture  with  intra-medullary screw fixation to increase the likelihood of continued uneventful healing and try to avoid the dramatic consequences associated with scaphoid nonunion.The patient is in agreement.  She is scheduled for 07-08-17. She was placed back into her thumb spica cast and this was secured with Coban.  The details of the operative procedure were discussed with the patient.  Questions were invited and answered.  In addition to the goal of the procedure, the risks of the procedure to include but not limited to bleeding; infection; damage to the nerves or blood vessels that could result in  bleeding, numbness, weakness, chronic pain, and the need for additional procedures; stiffness; the need for revision surgery; and anesthetic risks were reviewed.  No specific outcome was guaranteed or implied.  Informed consent was obtained.   Jolyn Nap, MD 07/07/2017, 8:31 PM

## 2017-07-08 ENCOUNTER — Ambulatory Visit (HOSPITAL_COMMUNITY): Payer: Worker's Compensation

## 2017-07-08 ENCOUNTER — Ambulatory Visit (HOSPITAL_BASED_OUTPATIENT_CLINIC_OR_DEPARTMENT_OTHER): Payer: Worker's Compensation | Admitting: Anesthesiology

## 2017-07-08 ENCOUNTER — Encounter (HOSPITAL_BASED_OUTPATIENT_CLINIC_OR_DEPARTMENT_OTHER): Payer: Self-pay

## 2017-07-08 ENCOUNTER — Encounter (HOSPITAL_BASED_OUTPATIENT_CLINIC_OR_DEPARTMENT_OTHER)
Admission: RE | Disposition: A | Discharge status: Home / Self Care | Payer: Self-pay | Source: Ambulatory Visit | Attending: Orthopedic Surgery

## 2017-07-08 ENCOUNTER — Other Ambulatory Visit: Payer: Self-pay

## 2017-07-08 ENCOUNTER — Ambulatory Visit (HOSPITAL_BASED_OUTPATIENT_CLINIC_OR_DEPARTMENT_OTHER)
Admission: RE | Admit: 2017-07-08 | Discharge: 2017-07-08 | Disposition: A | Discharge status: Home / Self Care | Payer: Worker's Compensation | Source: Ambulatory Visit | Attending: Orthopedic Surgery | Admitting: Orthopedic Surgery

## 2017-07-08 DIAGNOSIS — S62001A Unspecified fracture of navicular [scaphoid] bone of right wrist, initial encounter for closed fracture: Secondary | ICD-10-CM | POA: Insufficient documentation

## 2017-07-08 DIAGNOSIS — E785 Hyperlipidemia, unspecified: Secondary | ICD-10-CM | POA: Diagnosis not present

## 2017-07-08 DIAGNOSIS — Z419 Encounter for procedure for purposes other than remedying health state, unspecified: Secondary | ICD-10-CM

## 2017-07-08 DIAGNOSIS — W010XXA Fall on same level from slipping, tripping and stumbling without subsequent striking against object, initial encounter: Secondary | ICD-10-CM | POA: Insufficient documentation

## 2017-07-08 DIAGNOSIS — Z8042 Family history of malignant neoplasm of prostate: Secondary | ICD-10-CM | POA: Insufficient documentation

## 2017-07-08 DIAGNOSIS — Z801 Family history of malignant neoplasm of trachea, bronchus and lung: Secondary | ICD-10-CM | POA: Insufficient documentation

## 2017-07-08 DIAGNOSIS — Y939 Activity, unspecified: Secondary | ICD-10-CM | POA: Insufficient documentation

## 2017-07-08 DIAGNOSIS — Z87891 Personal history of nicotine dependence: Secondary | ICD-10-CM | POA: Insufficient documentation

## 2017-07-08 DIAGNOSIS — I10 Essential (primary) hypertension: Secondary | ICD-10-CM | POA: Insufficient documentation

## 2017-07-08 DIAGNOSIS — H409 Unspecified glaucoma: Secondary | ICD-10-CM | POA: Diagnosis not present

## 2017-07-08 DIAGNOSIS — M8580 Other specified disorders of bone density and structure, unspecified site: Secondary | ICD-10-CM | POA: Insufficient documentation

## 2017-07-08 HISTORY — PX: ORIF SCAPHOID FRACTURE: SHX2130

## 2017-07-08 SURGERY — OPEN REDUCTION INTERNAL FIXATION (ORIF) SCAPHOID FRACTURE
Anesthesia: General | Site: Wrist | Laterality: Right

## 2017-07-08 MED ORDER — MIDAZOLAM HCL 2 MG/2ML IJ SOLN
1.0000 mg | INTRAMUSCULAR | Status: DC | PRN
  Administered 2017-07-08: 2 mg via INTRAVENOUS

## 2017-07-08 MED ORDER — PHENYLEPHRINE 40 MCG/ML (10ML) SYRINGE FOR IV PUSH (FOR BLOOD PRESSURE SUPPORT)
PREFILLED_SYRINGE | INTRAVENOUS | Status: DC | PRN
  Administered 2017-07-08 (×2): 40 ug via INTRAVENOUS

## 2017-07-08 MED ORDER — ONDANSETRON HCL 4 MG/2ML IJ SOLN
INTRAMUSCULAR | Status: AC
Start: 2017-07-08 — End: ?
  Filled 2017-07-08: qty 2

## 2017-07-08 MED ORDER — LIDOCAINE 2% (20 MG/ML) 5 ML SYRINGE
INTRAMUSCULAR | Status: DC | PRN
  Administered 2017-07-08: 50 mg via INTRAVENOUS

## 2017-07-08 MED ORDER — FENTANYL CITRATE (PF) 100 MCG/2ML IJ SOLN
INTRAMUSCULAR | Status: DC | PRN
  Administered 2017-07-08: 50 ug via INTRAVENOUS

## 2017-07-08 MED ORDER — LIDOCAINE 2% (20 MG/ML) 5 ML SYRINGE
INTRAMUSCULAR | Status: AC
  Filled 2017-07-08: qty 5

## 2017-07-08 MED ORDER — FENTANYL CITRATE (PF) 100 MCG/2ML IJ SOLN
INTRAMUSCULAR | Status: AC
  Filled 2017-07-08: qty 2

## 2017-07-08 MED ORDER — PROPOFOL 10 MG/ML IV BOLUS
INTRAVENOUS | Status: AC
  Filled 2017-07-08: qty 20

## 2017-07-08 MED ORDER — MIDAZOLAM HCL 2 MG/2ML IJ SOLN
INTRAMUSCULAR | Status: AC
  Filled 2017-07-08: qty 2

## 2017-07-08 MED ORDER — EPHEDRINE 5 MG/ML INJ
INTRAVENOUS | Status: AC
Start: 2017-07-08 — End: ?
  Filled 2017-07-08: qty 10

## 2017-07-08 MED ORDER — FENTANYL CITRATE (PF) 100 MCG/2ML IJ SOLN
50.0000 ug | INTRAMUSCULAR | Status: DC | PRN
  Administered 2017-07-08: 50 ug via INTRAVENOUS

## 2017-07-08 MED ORDER — HYDROMORPHONE HCL 1 MG/ML IJ SOLN: 0.2500 mg | INTRAMUSCULAR | Status: DC | PRN

## 2017-07-08 MED ORDER — OXYCODONE HCL 5 MG/5ML PO SOLN: 5.0000 mg | Freq: Once | ORAL | Status: DC | PRN

## 2017-07-08 MED ORDER — DEXAMETHASONE SODIUM PHOSPHATE 10 MG/ML IJ SOLN
INTRAMUSCULAR | Status: AC
  Filled 2017-07-08: qty 1

## 2017-07-08 MED ORDER — BUPIVACAINE-EPINEPHRINE (PF) 0.5% -1:200000 IJ SOLN
INTRAMUSCULAR | Status: DC | PRN
  Administered 2017-07-08: 30 mL via PERINEURAL

## 2017-07-08 MED ORDER — ACETAMINOPHEN 325 MG PO TABS: 650.0000 mg | ORAL_TABLET | Freq: Four times a day (QID) | ORAL | Status: DC

## 2017-07-08 MED ORDER — DEXAMETHASONE SODIUM PHOSPHATE 10 MG/ML IJ SOLN
INTRAMUSCULAR | Status: DC | PRN
  Administered 2017-07-08: 10 mg via INTRAVENOUS

## 2017-07-08 MED ORDER — OXYCODONE HCL 5 MG PO TABS: 5.0000 mg | ORAL_TABLET | Freq: Once | ORAL | Status: DC | PRN

## 2017-07-08 MED ORDER — LACTATED RINGERS IV SOLN: INTRAVENOUS | Status: DC

## 2017-07-08 MED ORDER — OXYCODONE HCL 5 MG PO TABS: 5.0000 mg | ORAL_TABLET | Freq: Four times a day (QID) | ORAL | 0 refills | Status: DC | PRN

## 2017-07-08 MED ORDER — SCOPOLAMINE 1 MG/3DAYS TD PT72: 1.0000 | MEDICATED_PATCH | Freq: Once | TRANSDERMAL | Status: DC | PRN

## 2017-07-08 MED ORDER — MELOXICAM 7.5 MG PO TABS: 7.5000 mg | ORAL_TABLET | Freq: Every day | ORAL | 1 refills | Status: DC

## 2017-07-08 MED ORDER — ONDANSETRON HCL 4 MG/2ML IJ SOLN
INTRAMUSCULAR | Status: DC | PRN
  Administered 2017-07-08: 4 mg via INTRAVENOUS

## 2017-07-08 MED ORDER — PHENYLEPHRINE 40 MCG/ML (10ML) SYRINGE FOR IV PUSH (FOR BLOOD PRESSURE SUPPORT)
PREFILLED_SYRINGE | INTRAVENOUS | Status: AC
  Filled 2017-07-08: qty 10

## 2017-07-08 MED ORDER — PROPOFOL 10 MG/ML IV BOLUS
INTRAVENOUS | Status: DC | PRN
  Administered 2017-07-08: 200 mg via INTRAVENOUS

## 2017-07-08 MED ORDER — CEFAZOLIN SODIUM-DEXTROSE 2-4 GM/100ML-% IV SOLN
INTRAVENOUS | Status: AC
  Filled 2017-07-08: qty 100

## 2017-07-08 MED ORDER — KETOROLAC TROMETHAMINE 30 MG/ML IJ SOLN
INTRAMUSCULAR | Status: AC
  Filled 2017-07-08: qty 1

## 2017-07-08 MED ORDER — EPHEDRINE SULFATE-NACL 50-0.9 MG/10ML-% IV SOSY
PREFILLED_SYRINGE | INTRAVENOUS | Status: DC | PRN
  Administered 2017-07-08 (×2): 5 mg via INTRAVENOUS

## 2017-07-08 MED ORDER — PROMETHAZINE HCL 25 MG/ML IJ SOLN: 6.2500 mg | INTRAMUSCULAR | Status: DC | PRN

## 2017-07-08 MED ORDER — MEPERIDINE HCL 25 MG/ML IJ SOLN: 6.2500 mg | INTRAMUSCULAR | Status: DC | PRN

## 2017-07-08 MED ORDER — CEFAZOLIN SODIUM-DEXTROSE 2-4 GM/100ML-% IV SOLN
2.0000 g | INTRAVENOUS | Status: AC
  Administered 2017-07-08: 2 g via INTRAVENOUS

## 2017-07-08 MED ORDER — LACTATED RINGERS IV SOLN
INTRAVENOUS | Status: DC
  Administered 2017-07-08 (×2): via INTRAVENOUS

## 2017-07-08 SURGICAL SUPPLY — 55 items
BANDAGE COBAN STERILE 2 (GAUZE/BANDAGES/DRESSINGS) IMPLANT
BIT DRILL ACUTRAK 2 MINI (BIT) ×1 IMPLANT
BLADE MINI RND TIP GREEN BEAV (BLADE) IMPLANT
BLADE SURG 15 STRL LF DISP TIS (BLADE) ×2 IMPLANT
BLADE SURG 15 STRL SS (BLADE) ×2
BNDG COHESIVE 4X5 TAN STRL (GAUZE/BANDAGES/DRESSINGS) ×2 IMPLANT
BNDG ESMARK 4X9 LF (GAUZE/BANDAGES/DRESSINGS) ×2 IMPLANT
BNDG GAUZE ELAST 4 BULKY (GAUZE/BANDAGES/DRESSINGS) ×2 IMPLANT
BRUSH SCRUB EZ PLAIN DRY (MISCELLANEOUS) IMPLANT
CANISTER SUCT 1200ML W/VALVE (MISCELLANEOUS) IMPLANT
CHLORAPREP W/TINT 26ML (MISCELLANEOUS) ×2 IMPLANT
CORD BIPOLAR FORCEPS 12FT (ELECTRODE) ×2 IMPLANT
COVER BACK TABLE 60X90IN (DRAPES) ×2 IMPLANT
COVER MAYO STAND STRL (DRAPES) ×2 IMPLANT
CUFF TOURNIQUET SINGLE 18IN (TOURNIQUET CUFF) ×2 IMPLANT
CUFF TOURNIQUET SINGLE 24IN (TOURNIQUET CUFF) IMPLANT
DRAPE C-ARM 42X72 X-RAY (DRAPES) ×2 IMPLANT
DRAPE EXTREMITY T 121X128X90 (DRAPE) ×2 IMPLANT
DRAPE SURG 17X23 STRL (DRAPES) ×2 IMPLANT
DRILL ACUTRAK 2 MINI (BIT) ×2
DRSG ADAPTIC 3X8 NADH LF (GAUZE/BANDAGES/DRESSINGS) ×2 IMPLANT
DRSG EMULSION OIL 3X3 NADH (GAUZE/BANDAGES/DRESSINGS) ×2 IMPLANT
ELECT REM PT RETURN 9FT ADLT (ELECTROSURGICAL)
ELECTRODE REM PT RTRN 9FT ADLT (ELECTROSURGICAL) IMPLANT
GAUZE SPONGE 4X4 12PLY STRL LF (GAUZE/BANDAGES/DRESSINGS) ×2 IMPLANT
GLOVE BIO SURGEON STRL SZ 6.5 (GLOVE) ×2 IMPLANT
GLOVE BIO SURGEON STRL SZ7.5 (GLOVE) ×2 IMPLANT
GLOVE BIOGEL PI IND STRL 7.0 (GLOVE) ×3 IMPLANT
GLOVE BIOGEL PI IND STRL 8 (GLOVE) ×1 IMPLANT
GLOVE BIOGEL PI INDICATOR 7.0 (GLOVE) ×3
GLOVE BIOGEL PI INDICATOR 8 (GLOVE) ×1
GLOVE ECLIPSE 6.5 STRL STRAW (GLOVE) ×2 IMPLANT
GOWN STRL REUS W/TWL XL LVL3 (GOWN DISPOSABLE) ×2 IMPLANT
GUIDEWIRE ORTHO MINI ACTK .045 (WIRE) ×2 IMPLANT
NEEDLE 16GX1 1/2 (NEEDLE) ×2 IMPLANT
NEEDLE HYPO 25X1 1.5 SAFETY (NEEDLE) IMPLANT
NS IRRIG 1000ML POUR BTL (IV SOLUTION) ×2 IMPLANT
PACK BASIN DAY SURGERY FS (CUSTOM PROCEDURE TRAY) ×2 IMPLANT
PADDING CAST ABS 4INX4YD NS (CAST SUPPLIES) ×1
PADDING CAST ABS COTTON 4X4 ST (CAST SUPPLIES) ×1 IMPLANT
PENCIL BUTTON HOLSTER BLD 10FT (ELECTRODE) IMPLANT
RUBBERBAND STERILE (MISCELLANEOUS) IMPLANT
SCREW ACUTRAK 2 MINI 18MM (Screw) ×2 IMPLANT
SLEEVE SCD COMPRESS KNEE MED (MISCELLANEOUS) ×2 IMPLANT
STOCKINETTE 6  STRL (DRAPES) ×1
STOCKINETTE 6 STRL (DRAPES) ×1 IMPLANT
SUT VIC AB 2-0 CT3 27 (SUTURE) ×2 IMPLANT
SUT VICRYL 4-0 PS2 18IN ABS (SUTURE) IMPLANT
SUT VICRYL RAPIDE 4-0 (SUTURE) IMPLANT
SUT VICRYL RAPIDE 4/0 PS 2 (SUTURE) ×2 IMPLANT
SYR 10ML LL (SYRINGE) IMPLANT
SYR BULB 3OZ (MISCELLANEOUS) ×2 IMPLANT
TOWEL OR 17X24 6PK STRL BLUE (TOWEL DISPOSABLE) ×4 IMPLANT
TOWEL OR NON WOVEN STRL DISP B (DISPOSABLE) IMPLANT
UNDERPAD 30X30 (UNDERPADS AND DIAPERS) ×2 IMPLANT

## 2017-07-08 NOTE — Anesthesia Postprocedure Evaluation (Signed)
Anesthesia Post Note  Patient: Tonya Taylor  Procedure(s) Performed: OPEN REDUCTION INTERNAL FIXATION OF RIGHT SCAPHOID FRACTURE (Right Wrist)     Patient location during evaluation: PACU Anesthesia Type: General and Regional Level of consciousness: awake and alert Pain management: pain level controlled Vital Signs Assessment: post-procedure vital signs reviewed and stable Respiratory status: spontaneous breathing, nonlabored ventilation, respiratory function stable and patient connected to nasal cannula oxygen Cardiovascular status: blood pressure returned to baseline and stable Postop Assessment: no apparent nausea or vomiting Anesthetic complications: no    Last Vitals:  Vitals:   07/08/17 0900 07/08/17 0937  BP: (!) 151/92 (!) 161/87  Pulse: 72 67  Resp: 18 20  Temp:  36.4 C  SpO2: 99% 100%    Last Pain:  Vitals:   07/08/17 0937  TempSrc:   PainSc: 0-No pain                 Effie Berkshire

## 2017-07-08 NOTE — Anesthesia Procedure Notes (Signed)
Anesthesia Regional Block: Supraclavicular block   Pre-Anesthetic Checklist: ,, timeout performed, Correct Patient, Correct Site, Correct Laterality, Correct Procedure, Correct Position, site marked, Risks and benefits discussed,  Surgical consent,  Pre-op evaluation,  At surgeon's request and post-op pain management  Laterality: Right  Prep: chloraprep       Needles:  Injection technique: Single-shot  Needle Type: Echogenic Needle     Needle Length: 9cm  Needle Gauge: 21     Additional Needles:   Procedures:,,,, ultrasound used (permanent image in chart),,,,  Narrative:  Start time: 07/08/2017 7:05 AM End time: 07/08/2017 7:15 AM Injection made incrementally with aspirations every 5 mL.  Performed by: Personally  Anesthesiologist: Effie Berkshire, MD  Additional Notes: Patient tolerated the procedure well. Local anesthetic introduced in an incremental fashion under minimal resistance after negative aspirations. No paresthesias were elicited. After completion of the procedure, no acute issues were identified and patient continued to be monitored by RN.

## 2017-07-08 NOTE — Anesthesia Preprocedure Evaluation (Addendum)
Anesthesia Evaluation  Patient identified by MRN, date of birth, ID band Patient awake    Reviewed: Allergy & Precautions, NPO status , Patient's Chart, lab work & pertinent test results  Airway Mallampati: II  TM Distance: >3 FB Neck ROM: Full    Dental  (+) Teeth Intact, Dental Advisory Given   Pulmonary former smoker,    breath sounds clear to auscultation       Cardiovascular hypertension, Pt. on medications  Rhythm:Regular Rate:Normal     Neuro/Psych negative neurological ROS  negative psych ROS   GI/Hepatic negative GI ROS,   Endo/Other    Renal/GU      Musculoskeletal negative musculoskeletal ROS (+)   Abdominal Normal abdominal exam  (+)   Peds  Hematology   Anesthesia Other Findings   Reproductive/Obstetrics                            Lab Results  Component Value Date   WBC 3.6 (L) 03/19/2016   HGB 12.7 03/19/2016   HCT 38.2 03/19/2016   MCV 85.5 03/19/2016   PLT 248.0 03/19/2016   EKG: sinus bradycardia.   Anesthesia Physical Anesthesia Plan  ASA: II  Anesthesia Plan: General   Post-op Pain Management: GA combined w/ Regional for post-op pain   Induction: Intravenous  PONV Risk Score and Plan: 4 or greater and Ondansetron, Dexamethasone, Midazolam and Scopolamine patch - Pre-op  Airway Management Planned: LMA  Additional Equipment: None  Intra-op Plan:   Post-operative Plan: Extubation in OR  Informed Consent: I have reviewed the patients History and Physical, chart, labs and discussed the procedure including the risks, benefits and alternatives for the proposed anesthesia with the patient or authorized representative who has indicated his/her understanding and acceptance.   Dental advisory given  Plan Discussed with: CRNA  Anesthesia Plan Comments:         Anesthesia Quick Evaluation

## 2017-07-08 NOTE — Progress Notes (Signed)
Assisted Dr. Hollis with right, ultrasound guided, supraclavicular block. Side rails up, monitors on throughout procedure. See vital signs in flow sheet. Tolerated Procedure well. 

## 2017-07-08 NOTE — Discharge Instructions (Signed)
Discharge Instructions   You have a dressing with a plaster splint incorporated in it. Move your fingers as much as possible, making a full fist and fully opening the fist. Elevate your hand to reduce pain & swelling of the digits.  Ice over the operative site may be helpful to reduce pain & swelling.  DO NOT USE HEAT. Pain medicine has been prescribed for you.  Take the Meloxicam 1 x per day. Take the tylenol, 650 mg every 6 hours. Take the Oxycodone for severe breakthrough pain.  Leave the dressing in place until you return to our office.  You may shower, but keep the bandage clean & dry.  You may drive a car when you are off of prescription pain medications and can safely control your vehicle with both hands. Call our office to schedule a follow up appointment for 10-15 days from the date of surgery.   Please call [phone] during normal business hours or [phone] after hours for any problems. Including the following:  - excessive redness of the incisions - drainage for more than 4 days - fever of more than 101.5 F  *Please note that pain medications will not be refilled after hours or on weekends.  NO MEDICINES LIKE ADVIL, IBUPROFEN, ALEVE, MELOXICAM, ETC.  ONLY TYLENOL AND/OR OXYCODONE PLEASE  WORK STATUS: Return to work on Monday, July 15, 2017 with no lifting, gripping or grasping more than pencil and paper tasks with the right arm.     Post Anesthesia Home Care Instructions  Activity: Get plenty of rest for the remainder of the day. A responsible individual must stay with you for 24 hours following the procedure.  For the next 24 hours, DO NOT: -Drive a car -Paediatric nurse -Drink alcoholic beverages -Take any medication unless instructed by your physician -Make any legal decisions or sign important papers.  Meals: Start with liquid foods such as gelatin or soup. Progress to regular foods as tolerated. Avoid greasy, spicy, heavy foods. If nausea and/or  vomiting occur, drink only clear liquids until the nausea and/or vomiting subsides. Call your physician if vomiting continues.  Special Instructions/Symptoms: Your throat may feel dry or sore from the anesthesia or the breathing tube placed in your throat during surgery. If this causes discomfort, gargle with warm salt water. The discomfort should disappear within 24 hours.  If you had a scopolamine patch placed behind your ear for the management of post- operative nausea and/or vomiting:  1. The medication in the patch is effective for 72 hours, after which it should be removed.  Wrap patch in a tissue and discard in the trash. Wash hands thoroughly with soap and water. 2. You may remove the patch earlier than 72 hours if you experience unpleasant side effects which may include dry mouth, dizziness or visual disturbances. 3. Avoid touching the patch. Wash your hands with soap and water after contact with the patch.   Regional Anesthesia Blocks  1. Numbness or the inability to move the "blocked" extremity may last from 3-48 hours after placement. The length of time depends on the medication injected and your individual response to the medication. If the numbness is not going away after 48 hours, call your surgeon.  2. The extremity that is blocked will need to be protected until the numbness is gone and the  Strength has returned. Because you cannot feel it, you will need to take extra care to avoid injury. Because it may be weak, you may have difficulty moving it or  using it. You may not know what position it is in without looking at it while the block is in effect.  3. For blocks in the legs and feet, returning to weight bearing and walking needs to be done carefully. You will need to wait until the numbness is entirely gone and the strength has returned. You should be able to move your leg and foot normally before you try and bear weight or walk. You will need someone to be with you when you  first try to ensure you do not fall and possibly risk injury.  4. Bruising and tenderness at the needle site are common side effects and will resolve in a few days.  5. Persistent numbness or new problems with movement should be communicated to the surgeon or the [facility] [phone] [place] [phone]).

## 2017-07-08 NOTE — Anesthesia Procedure Notes (Signed)
Procedure Name: LMA Insertion Date/Time: 07/08/2017 7:45 AM Performed by: Gwyndolyn Saxon, CRNA Pre-anesthesia Checklist: Patient identified, Emergency Drugs available, Suction available, Patient being monitored and Timeout performed Patient Re-evaluated:Patient Re-evaluated prior to induction Oxygen Delivery Method: Circle system utilized Preoxygenation: Pre-oxygenation with 100% oxygen Induction Type: IV induction Ventilation: Mask ventilation without difficulty LMA: LMA inserted LMA Size: 4.0 Number of attempts: 1 Placement Confirmation: positive ETCO2,  CO2 detector and breath sounds checked- equal and bilateral Tube secured with: Tape Dental Injury: Teeth and Oropharynx as per pre-operative assessment

## 2017-07-08 NOTE — Transfer of Care (Signed)
Immediate Anesthesia Transfer of Care Note  Patient: Tonya Taylor  Procedure(s) Performed: OPEN REDUCTION INTERNAL FIXATION OF RIGHT SCAPHOID FRACTURE (Right Wrist)  Patient Location: PACU  Anesthesia Type:General  Level of Consciousness: drowsy  Airway & Oxygen Therapy: Patient Spontanous Breathing and Patient connected to face mask oxygen  Post-op Assessment: Report given to RN and Post -op Vital signs reviewed and stable  Post vital signs: Reviewed and stable  Last Vitals:  Vitals:   07/08/17 0705 07/08/17 0713  BP: (!) 181/98 (!) 142/87  Pulse: 60 (!) 58  Resp: 13 11  Temp:    SpO2: 99% 100%    Last Pain:  Vitals:   07/08/17 0657  TempSrc: Oral  PainSc:       Patients Stated Pain Goal: 1 (20/80/22 3361)  Complications: No apparent anesthesia complications

## 2017-07-08 NOTE — Interval H&P Note (Signed)
History and Physical Interval Note:  07/08/2017 7:35 AM  Tonya Taylor  has presented today for surgery, with the diagnosis of RIGHT SCAPHOID FRACTURE S62.025D  The various methods of treatment have been discussed with the patient and family. After consideration of risks, benefits and other options for treatment, the patient has consented to  Procedure(s): OPEN REDUCTION INTERNAL FIXATION OF RIGHT SCAPHOID FRACTURE (Right) as a surgical intervention .  The patient's history has been reviewed, patient examined, no change in status, stable for surgery.  I have reviewed the patient's chart and labs.  Questions were answered to the patient's satisfaction.     Jolyn Nap

## 2017-07-09 ENCOUNTER — Encounter (HOSPITAL_BASED_OUTPATIENT_CLINIC_OR_DEPARTMENT_OTHER): Payer: Self-pay | Admitting: Orthopedic Surgery

## 2017-07-09 NOTE — Addendum Note (Signed)
Addendum  created 07/09/17 9767 by Gloriann Riede, Ernesta Amble, CRNA   Charge Capture section accepted

## 2017-07-16 ENCOUNTER — Other Ambulatory Visit: Payer: Self-pay | Admitting: Endocrinology

## 2017-12-30 ENCOUNTER — Other Ambulatory Visit: Payer: Self-pay | Admitting: Endocrinology

## 2018-03-03 ENCOUNTER — Other Ambulatory Visit: Payer: Self-pay | Admitting: Endocrinology

## 2018-03-31 ENCOUNTER — Other Ambulatory Visit: Payer: Self-pay

## 2018-03-31 ENCOUNTER — Encounter: Payer: Self-pay | Admitting: Family Medicine

## 2018-03-31 ENCOUNTER — Ambulatory Visit: Payer: BC Managed Care – PPO | Admitting: Family Medicine

## 2018-03-31 ENCOUNTER — Other Ambulatory Visit: Payer: Self-pay | Admitting: Endocrinology

## 2018-03-31 VITALS — BP 139/82 | HR 69 | Temp 98.5°F | Ht 67.0 in | Wt 226.2 lb

## 2018-03-31 DIAGNOSIS — R059 Cough, unspecified: Secondary | ICD-10-CM

## 2018-03-31 DIAGNOSIS — J029 Acute pharyngitis, unspecified: Secondary | ICD-10-CM | POA: Diagnosis not present

## 2018-03-31 DIAGNOSIS — J069 Acute upper respiratory infection, unspecified: Secondary | ICD-10-CM

## 2018-03-31 DIAGNOSIS — R05 Cough: Secondary | ICD-10-CM | POA: Diagnosis not present

## 2018-03-31 LAB — POCT RAPID STREP A (OFFICE): Rapid Strep A Screen: NEGATIVE

## 2018-03-31 MED ORDER — BENZONATATE 100 MG PO CAPS: 100.0000 mg | ORAL_CAPSULE | Freq: Three times a day (TID) | ORAL | 0 refills | Status: DC | PRN

## 2018-03-31 NOTE — Progress Notes (Signed)
Subjective:  By signing my name below, I, Moises Blood, attest that this documentation has been prepared under the direction and in the presence of Merri Ray, MD. Electronically Signed: Moises Blood, [place]. 03/31/2018 , 2:58 PM .  Patient was seen in Room 9 .   Patient ID: Tonya Taylor, female    DOB: 08-15-1955, 62 y.o.   MRN: [id number] Chief Complaint  Patient presents with  . Cough    late sataurday evening.  . Generalized Body Aches  . Sore Throat  . Nasal Congestion   HPI Tonya Taylor is a 62 y.o. female  Patient states she started feeling sick with sore throat, rhinorrhea and some rattling cough on Saturday evening (2 nights ago). She's been coughing up clear mucus. She also notes having some body aches. She's been able to eat and drink without difficulty; plenty of hot tea. She's also been taking OTC mucinex and cold&flu medication. She denies any measured fevers. Worst symptoms is the cough. She is able to sleep at night when she takes OTC cold&flu medication.   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 She talked to a coworker who also went on the same field trip, and she had sore throat as well.   Patient Active Problem List   Diagnosis Date Noted  . Back pain 03/20/2017  . URI (upper respiratory infection) 07/24/2016  . Vitamin D deficiency 03/16/2015  . Wellness examination 03/01/2014  . Hyperglycemia 02/20/2013  . Menopausal state 03/05/2012  . Abnormal ECG 02/19/2011  . NASH (nonalcoholic steatohepatitis) 02/07/2010  . NEUTROPENIA UNSPECIFIED 01/31/2009  . Iron deficiency anemia due to chronic blood loss 04/05/2008  . FOOT PAIN, RIGHT 04/05/2008  . ANKLE PAIN, LEFT 01/02/2008  . EDEMA 01/02/2008  . Dyslipidemia 02/01/2007  . Essential hypertension 02/01/2007  . INSOMNIA 02/01/2007  . TOBACCO USE, QUIT 02/01/2007   Past Medical History:    Diagnosis Date  . Allergy   . ANEMIA DUE TO CHRONIC BLOOD LOSS 04/05/2008  . Edema 01/02/2008  . Glaucoma   . HYPERLIPIDEMIA 02/01/2007  . HYPERTENSION 02/01/2007  . INSOMNIA 02/01/2007  . NEUTROPENIA UNSPECIFIED 01/31/2009  . Osteopenia   . Other chronic nonalcoholic liver disease 53/01/7672  . TOBACCO USE, QUIT 02/01/2007   Past Surgical History:  Procedure Laterality Date  . COLONOSCOPY    . DENTAL SURGERY    . HAND SURGERY     removed a nerve   . KNEE ARTHROSCOPY Left   . ORIF SCAPHOID FRACTURE Right 07/08/2017   Procedure: OPEN REDUCTION INTERNAL FIXATION OF RIGHT SCAPHOID FRACTURE;  Surgeon: Milly Jakob, MD;  Location: [facility];  Service: Orthopedics;  Laterality: Right;   No Known Allergies Prior to Admission medications   Medication Sig Start Date End Date Taking? Authorizing Provider  acetaminophen (TYLENOL) 325 MG tablet Take 2 tablets (650 mg total) by mouth every 6 (six) hours. 07/08/17   Milly Jakob, MD  amLODipine (NORVASC) 5 MG tablet TAKE 1 TABLET BY MOUTH DAILY. 07/16/17   Renato Shin, MD  Ascorbic Acid (VITAMIN C) 1000 MG tablet Take 1,000 mg by mouth daily.      [provider]  aspirin 81 MG tablet Take 81 mg by mouth daily.      [provider]  B Complex-C (SUPER B COMPLEX) TABS Take 1 tablet by mouth daily.      [provider]  Calcium Carbonate-Vit D-Min (CALCIUM 600+D PLUS MINERALS PO) Take 2 tablets by mouth daily.      [provider]  cetirizine (ZYRTEC) 10 MG tablet Take 10 mg by mouth daily.      [provider]  Cholecalciferol (D 5000) 5000 UNITS TABS Take 1 tablet by mouth daily.      [provider]  Coenzyme Q10 (COQ10 PO) Take 1 capsule by mouth daily.      [provider]  Docusate Calcium (STOOL SOFTENER PO) Take 2 tablets by mouth daily.    [provider]  ECHINACEA PO Take 1 capsule by mouth daily. 760mg      [provider]  guaiFENesin  (MUCINEX) 600 MG 12 hr tablet Take by mouth 2 (two) times daily.    [provider]  Magnesium 400 MG CAPS Take 1 capsule by mouth daily.      [provider]  Multiple Vitamin (MULTIVITAMIN) tablet Take 1 tablet by mouth daily.      [provider]  Omega-3 1400 MG CAPS Take 1 capsule by mouth daily.      [provider]  OVER THE COUNTER MEDICATION Hair, skin, nails gummies- 1 po daily    [provider]  oxyCODONE (ROXICODONE) 5 MG immediate release tablet Take 1 tablet (5 mg total) by mouth every 6 (six) hours as needed for breakthrough pain. 07/08/17   Milly Jakob, MD  simvastatin (ZOCOR) 20 MG tablet Take 1 tablet (20 mg total) by mouth at bedtime. 06/26/16   Renato Shin, MD  telmisartan-hydrochlorothiazide (MICARDIS HCT) 80-25 MG tablet TAKE 1 TABLET BY MOUTH EVERY DAY (REPLACES OLMESARTAN HCTZ) 03/31/18   Renato Shin, MD  vitamin B-12 (CYANOCOBALAMIN) 1000 MCG tablet Take 1,000 mcg by mouth daily.      [provider]  vitamin E 1000 UNIT capsule Take 1,000 Units by mouth daily.      [provider]   Social History   Socioeconomic History  . Marital status: Married    Spouse name: Not on file  . Number of children: Not on file  . Years of education: Not on file  . Highest education level: Not on file  Occupational History  . Occupation: Works for school system  Social Needs  . Financial resource strain: Not on file  . Food insecurity:    Worry: Not on file    Inability: Not on file  . Transportation needs:    Medical: Not on file    Non-medical: Not on file  Tobacco Use  . Smoking status: Former Smoker    Types: Cigarettes  . Smokeless tobacco: Never Used  Substance and Sexual Activity  . Alcohol use: No    Alcohol/week: 0.0 standard drinks  . Drug use: No  . Sexual activity: Not on file  Lifestyle  . Physical activity:    Days per week: Not on file    Minutes per session: Not on file  . Stress: Not  on file  Relationships  . Social connections:    Talks on phone: Not on file    Gets together: Not on file    Attends religious service: Not on file    Active member of club or organization: Not on file    Attends meetings of clubs or organizations: Not on file    Relationship status: Not on file  . Intimate partner violence:    Fear of current or ex partner: Not on file    Emotionally abused: Not  on file    Physically abused: Not on file    Forced sexual activity: Not on file  Other Topics Concern  . Not on file  Social History Narrative  . Not on file   Review of Systems  Constitutional: Negative for appetite change, chills, fatigue, fever and unexpected weight change.  HENT: Positive for congestion and sore throat.   Respiratory: Positive for cough. Negative for shortness of breath and wheezing.   Gastrointestinal: Negative for constipation, diarrhea, nausea and vomiting.  Musculoskeletal: Positive for myalgias.  Skin: Negative for rash and wound.  Neurological: Negative for dizziness, weakness and headaches.       Objective:   Physical Exam  Constitutional: She is oriented to person, place, and time. She appears well-developed and well-nourished. No distress.  HENT:  Head: Normocephalic and atraumatic.  Right Ear: Hearing, tympanic membrane, external ear and ear canal normal.  Left Ear: Hearing, tympanic membrane, external ear and ear canal normal.  Nose: Nose normal.  Mouth/Throat: Oropharynx is clear and moist. No oropharyngeal exudate.  Eyes: Pupils are equal, round, and reactive to light. Conjunctivae and EOM are normal.  Cardiovascular: Normal rate, regular rhythm, normal heart sounds and intact distal pulses.  No murmur heard. Pulmonary/Chest: Effort normal and breath sounds normal. No respiratory distress. She has no wheezes. She has no rhonchi.  Neurological: She is alert and oriented to person, place, and time.  Skin: Skin is warm and dry. No rash noted.    Psychiatric: She has a normal mood and affect. Her behavior is normal.  Vitals reviewed.   Vitals:   03/31/18 1406  BP: 139/82  Pulse: 69  Temp: 98.5 F (36.9 C)  TempSrc: Oral  SpO2: 96%  Weight: 226 lb 3.2 oz (102.6 kg)  Height: 5\' 7"  (1.702 m)   Results for orders placed or performed in visit on 03/31/18  POCT rapid strep A  Result Value Ref Range   Rapid Strep A Screen Negative Negative       Assessment & Plan:    Tonya Taylor is a 62 y.o. female Cough - Plan: benzonatate (TESSALON) 100 MG capsule Sore throat - Plan: POCT rapid strep A, Culture, Group A Strep Acute upper respiratory infection  -Likely viral upper respiratory infection.  Reassuring is improved symptomatic care and RTC precautions given.  Tessalon Perles as needed for cough or Mucinex, saline nasal spray for congestion.   Meds ordered this encounter  Medications  . benzonatate (TESSALON) 100 MG capsule    Sig: Take 1 capsule (100 mg total) by mouth 3 (three) times daily as needed for cough.    Dispense:  20 capsule    Refill:  0   Patient Instructions   Tessalon Perles or Mucinex as needed for cough, saline nasal spray as needed for congestion.  Make sure to drink plenty fluids and rest.  See other information below.  Return to the clinic or go to the nearest emergency room if any of your symptoms worsen or new symptoms occur.   Upper Respiratory Infection, Adult Most upper respiratory infections (URIs) are caused by a virus. A URI affects the nose, throat, and upper air passages. The most common type of URI is often called "the common cold." Follow these instructions at home:  Take medicines only as told by your doctor.  Gargle warm saltwater or take cough drops to comfort your throat as told by your doctor.  Use a warm mist humidifier or inhale steam from a shower to increase  air moisture. This may make it easier to breathe.  Drink enough fluid to keep your pee (urine) clear or pale  yellow.  Eat soups and other clear broths.  Have a healthy diet.  Rest as needed.  Go back to work when your fever is gone or your doctor says it is okay. ? You may need to stay home longer to avoid giving your URI to others. ? You can also wear a face mask and wash your hands often to prevent spread of the virus.  Use your inhaler more if you have asthma.  Do not use any tobacco products, including cigarettes, chewing tobacco, or electronic cigarettes. If you need help quitting, ask your doctor. Contact a doctor if:  You are getting worse, not better.  Your symptoms are not helped by medicine.  You have chills.  You are getting more short of breath.  You have brown or red mucus.  You have yellow or brown discharge from your nose.  You have pain in your face, especially when you bend forward.  You have a fever.  You have puffy (swollen) neck glands.  You have pain while swallowing.  You have white areas in the back of your throat. Get help right away if:  You have very bad or constant: ? Headache. ? Ear pain. ? Pain in your forehead, behind your eyes, and over your cheekbones (sinus pain). ? Chest pain.  You have long-lasting (chronic) lung disease and any of the following: ? Wheezing. ? Long-lasting cough. ? Coughing up blood. ? A change in your usual mucus.  You have a stiff neck.  You have changes in your: ? Vision. ? Hearing. ? Thinking. ? Mood. This information is not intended to replace advice given to you by your health care provider. Make sure you discuss any questions you have with your health care provider. Document Released: 10/10/2007 Document Revised: 12/25/2015 Document Reviewed: 07/29/2013 Elsevier Interactive Patient Education  [employer].   If you have lab work done today you will be contacted with your lab results within the next 2 weeks.  If you have not heard from [place] then please contact us. The fastest way to get your results  is to register for My Chart.   IF you received an x-ray today, you will receive an invoice from [facility]. Please contact [facility] at [phone] with questions or concerns regarding your invoice.   IF you received labwork today, you will receive an invoice from [facility]. Please contact [facility] at [phone] with questions or concerns regarding your invoice.   Our billing staff will not be able to assist you with questions regarding bills from these companies.  You will be contacted with the lab results as soon as they are available. The fastest way to get your results is to activate your My Chart account. Instructions are located on the last page of this paperwork. If you have not heard from [place] regarding the results in 2 weeks, please contact this office.       I personally performed the services described in this documentation, which was scribed in my presence. The recorded information has been reviewed and considered for accuracy and completeness, addended by me as needed, and agree with information above.  Signed,   Merri Ray, MD [facility] [place].  03/31/18 3:43 PM

## 2018-03-31 NOTE — Patient Instructions (Addendum)
Tessalon Perles or Mucinex as needed for cough, saline nasal spray as needed for congestion.  Make sure to drink plenty fluids and rest.  See other information below.  Return to the clinic or go to the nearest emergency room if any of your symptoms worsen or new symptoms occur.   Upper Respiratory Infection, Adult Most upper respiratory infections (URIs) are caused by a virus. A URI affects the nose, throat, and upper air passages. The most common type of URI is often called "the common cold." Follow these instructions at home:  Take medicines only as told by your doctor.  Gargle warm saltwater or take cough drops to comfort your throat as told by your doctor.  Use a warm mist humidifier or inhale steam from a shower to increase air moisture. This may make it easier to breathe.  Drink enough fluid to keep your pee (urine) clear or pale yellow.  Eat soups and other clear broths.  Have a healthy diet.  Rest as needed.  Go back to work when your fever is gone or your doctor says it is okay. ? You may need to stay home longer to avoid giving your URI to others. ? You can also wear a face mask and wash your hands often to prevent spread of the virus.  Use your inhaler more if you have asthma.  Do not use any tobacco products, including cigarettes, chewing tobacco, or electronic cigarettes. If you need help quitting, ask your doctor. Contact a doctor if:  You are getting worse, not better.  Your symptoms are not helped by medicine.  You have chills.  You are getting more short of breath.  You have brown or red mucus.  You have yellow or brown discharge from your nose.  You have pain in your face, especially when you bend forward.  You have a fever.  You have puffy (swollen) neck glands.  You have pain while swallowing.  You have white areas in the back of your throat. Get help right away if:  You have very bad or constant: ? Headache. ? Ear pain. ? Pain in your  forehead, behind your eyes, and over your cheekbones (sinus pain). ? Chest pain.  You have long-lasting (chronic) lung disease and any of the following: ? Wheezing. ? Long-lasting cough. ? Coughing up blood. ? A change in your usual mucus.  You have a stiff neck.  You have changes in your: ? Vision. ? Hearing. ? Thinking. ? Mood. This information is not intended to replace advice given to you by your health care provider. Make sure you discuss any questions you have with your health care provider. Document Released: 10/10/2007 Document Revised: 12/25/2015 Document Reviewed: 07/29/2013 Elsevier Interactive Patient Education  [employer].   If you have lab work done today you will be contacted with your lab results within the next 2 weeks.  If you have not heard from [place] then please contact us. The fastest way to get your results is to register for My Chart.   IF you received an x-ray today, you will receive an invoice from [facility]. Please contact [facility] at [phone] with questions or concerns regarding your invoice.   IF you received labwork today, you will receive an invoice from [facility]. Please contact [facility] at [phone] with questions or concerns regarding your invoice.   Our billing staff will not be able to assist you with questions regarding bills from these companies.  You will be contacted with the  lab results as soon as they are available. The fastest way to get your results is to activate your My Chart account. Instructions are located on the last page of this paperwork. If you have not heard from [place] regarding the results in 2 weeks, please contact this office.

## 2018-03-31 NOTE — Telephone Encounter (Signed)
Please refill x 3 months Further refills would have to be considered by new PCP   

## 2018-03-31 NOTE — Telephone Encounter (Signed)
Last OV 03/20/17 refill or refuse please advise

## 2018-05-16 ENCOUNTER — Other Ambulatory Visit: Payer: Self-pay | Admitting: Gynecology

## 2018-05-16 DIAGNOSIS — Z1231 Encounter for screening mammogram for malignant neoplasm of breast: Secondary | ICD-10-CM

## 2018-05-26 ENCOUNTER — Encounter: Payer: Worker's Compensation | Admitting: Family

## 2018-05-26 ENCOUNTER — Other Ambulatory Visit (INDEPENDENT_AMBULATORY_CARE_PROVIDER_SITE_OTHER): Payer: BC Managed Care – PPO

## 2018-05-26 ENCOUNTER — Encounter: Payer: Self-pay | Admitting: Family

## 2018-05-26 ENCOUNTER — Inpatient Hospital Stay: Admission: RE | Admit: 2018-05-26 | Payer: Self-pay | Source: Ambulatory Visit

## 2018-05-26 ENCOUNTER — Ambulatory Visit: Payer: BC Managed Care – PPO | Admitting: Family

## 2018-05-26 VITALS — BP 132/82 | HR 73 | Temp 98.5°F | Ht 67.0 in | Wt 223.1 lb

## 2018-05-26 DIAGNOSIS — I10 Essential (primary) hypertension: Secondary | ICD-10-CM

## 2018-05-26 DIAGNOSIS — E785 Hyperlipidemia, unspecified: Secondary | ICD-10-CM

## 2018-05-26 DIAGNOSIS — M8580 Other specified disorders of bone density and structure, unspecified site: Secondary | ICD-10-CM

## 2018-05-26 LAB — CBC WITH DIFFERENTIAL/PLATELET
BASOS PCT: 1.2 % (ref 0.0–3.0)
Basophils Absolute: 0.1 10*3/uL (ref 0.0–0.1)
EOS ABS: 0.4 10*3/uL (ref 0.0–0.7)
Eosinophils Relative: 8 % — ABNORMAL HIGH (ref 0.0–5.0)
HEMATOCRIT: 39.7 % (ref 36.0–46.0)
Hemoglobin: 13.2 g/dL (ref 12.0–15.0)
LYMPHS PCT: 26.4 % (ref 12.0–46.0)
Lymphs Abs: 1.2 10*3/uL (ref 0.7–4.0)
MCHC: 33.2 g/dL (ref 30.0–36.0)
MCV: 86.6 fl (ref 78.0–100.0)
Monocytes Absolute: 0.8 10*3/uL (ref 0.1–1.0)
NEUTROS ABS: 2 10*3/uL (ref 1.4–7.7)
Neutrophils Relative %: 46.3 % (ref 43.0–77.0)
PLATELETS: 220 10*3/uL (ref 150.0–400.0)
RBC: 4.59 Mil/uL (ref 3.87–5.11)
RDW: 14 % (ref 11.5–15.5)
WBC: 4.4 10*3/uL (ref 4.0–10.5)

## 2018-05-26 LAB — COMPREHENSIVE METABOLIC PANEL
ALT: 23 U/L (ref 0–35)
AST: 23 U/L (ref 0–37)
Albumin: 3.9 g/dL (ref 3.5–5.2)
Alkaline Phosphatase: 63 U/L (ref 39–117)
BUN: 13 mg/dL (ref 6–23)
CHLORIDE: 102 meq/L (ref 96–112)
CO2: 30 meq/L (ref 19–32)
CREATININE: 0.94 mg/dL (ref 0.40–1.20)
Calcium: 9.2 mg/dL (ref 8.4–10.5)
GFR: 73.15 mL/min (ref >60.00)
GLUCOSE: 71 mg/dL (ref 70–99)
Potassium: 3.5 mEq/L (ref 3.5–5.1)
SODIUM: 140 meq/L (ref 135–145)
Total Bilirubin: 0.4 mg/dL (ref 0.2–1.2)
Total Protein: 7.5 g/dL (ref 6.0–8.3)

## 2018-05-26 LAB — LIPID PANEL
CHOL/HDL RATIO: 3
Cholesterol: 162 mg/dL (ref 0–200)
HDL: 49.8 mg/dL (ref >39.00)
LDL CALC: 90 mg/dL (ref 0–99)
NonHDL: 112.62
Triglycerides: 112 mg/dL (ref 0.0–149.0)
VLDL: 22.4 mg/dL (ref 0.0–40.0)

## 2018-05-26 LAB — VITAMIN D 25 HYDROXY (VIT D DEFICIENCY, FRACTURES): VITD: 32.44 ng/mL (ref 30.00–100.00)

## 2018-05-26 MED ORDER — SIMVASTATIN 20 MG PO TABS: 20.0000 mg | ORAL_TABLET | Freq: Every day | ORAL | 3 refills | Status: DC

## 2018-05-26 MED ORDER — TELMISARTAN-HCTZ 80-25 MG PO TABS: 1.0000 | ORAL_TABLET | Freq: Every day | ORAL | 3 refills | Status: DC

## 2018-05-26 MED ORDER — AMLODIPINE BESYLATE 5 MG PO TABS: 5.0000 mg | ORAL_TABLET | Freq: Every day | ORAL | 3 refills | Status: DC

## 2018-05-26 NOTE — Progress Notes (Signed)
Tonya Taylor is a 63 y.o. female with the following history as recorded in EpicCare:  Patient Active Problem List   Diagnosis Date Noted  . Back pain 03/20/2017  . URI (upper respiratory infection) 07/24/2016  . Vitamin D deficiency 03/16/2015  . Wellness examination 03/01/2014  . Hyperglycemia 02/20/2013  . Menopausal state 03/05/2012  . Abnormal ECG 02/19/2011  . NASH (nonalcoholic steatohepatitis) 02/07/2010  . NEUTROPENIA UNSPECIFIED 01/31/2009  . Iron deficiency anemia due to chronic blood loss 04/05/2008  . FOOT PAIN, RIGHT 04/05/2008  . ANKLE PAIN, LEFT 01/02/2008  . EDEMA 01/02/2008  . Dyslipidemia 02/01/2007  . Essential hypertension 02/01/2007  . INSOMNIA 02/01/2007  . TOBACCO USE, QUIT 02/01/2007    Current Outpatient Medications  Medication Sig Dispense Refill  . amLODipine (NORVASC) 5 MG tablet Take 1 tablet (5 mg total) by mouth daily. 90 tablet 3  . Ascorbic Acid (VITAMIN C) 1000 MG tablet Take 1,000 mg by mouth daily.      Marland Kitchen aspirin 81 MG tablet Take 81 mg by mouth daily.      . Calcium Carbonate-Vit D-Min (CALCIUM 600+D PLUS MINERALS PO) Take 2 tablets by mouth daily.      . cetirizine (ZYRTEC) 10 MG tablet Take 10 mg by mouth daily.      . Cholecalciferol (D 5000) 5000 UNITS TABS Take 1 tablet by mouth daily.      . Coenzyme Q10 (COQ10 PO) Take 1 capsule by mouth daily.      Mariane Baumgarten Calcium (STOOL SOFTENER PO) Take 2 tablets by mouth daily.    Marland Kitchen ECHINACEA PO Take 1 capsule by mouth daily. 78m     . guaiFENesin (MUCINEX) 600 MG 12 hr tablet Take by mouth 2 (two) times daily.    . Magnesium 400 MG CAPS Take 1 capsule by mouth daily.      . Multiple Vitamin (MULTIVITAMIN) tablet Take 1 tablet by mouth daily.      . Omega-3 1400 MG CAPS Take 1 capsule by mouth daily.      .Marland KitchenOVER THE COUNTER MEDICATION Hair, skin, nails gummies- 1 po daily    . simvastatin (ZOCOR) 20 MG tablet Take 1 tablet (20 mg total) by mouth at bedtime. 90 tablet 3  .  telmisartan-hydrochlorothiazide (MICARDIS HCT) 80-25 MG tablet Take 1 tablet by mouth daily. 90 tablet 3  . vitamin B-12 (CYANOCOBALAMIN) 1000 MCG tablet Take 1,000 mcg by mouth daily.      . vitamin E 1000 UNIT capsule Take 1,000 Units by mouth daily.       No current facility-administered medications for this visit.     Allergies: Patient has no known allergies.  Past Medical History:  Diagnosis Date  . Allergy   . ANEMIA DUE TO CHRONIC BLOOD LOSS 04/05/2008  . Edema 01/02/2008  . Glaucoma   . HYPERLIPIDEMIA 02/01/2007  . HYPERTENSION 02/01/2007  . INSOMNIA 02/01/2007  . NEUTROPENIA UNSPECIFIED 01/31/2009  . Osteopenia   . Other chronic nonalcoholic liver disease 159/08/5857 . TOBACCO USE, QUIT 02/01/2007    Past Surgical History:  Procedure Laterality Date  . COLONOSCOPY    . DENTAL SURGERY    . HAND SURGERY     removed a nerve   . KNEE ARTHROSCOPY Left   . ORIF SCAPHOID FRACTURE Right 07/08/2017   Procedure: OPEN REDUCTION INTERNAL FIXATION OF RIGHT SCAPHOID FRACTURE;  Surgeon: TMilly Jakob MD;  Location[place] [facility]  Service: Orthopedics;  Laterality: Right;    Family History  Problem Relation Age of Onset  . Cancer Mother        Lung Cancer  . Lung cancer Mother   . Prostate cancer Father   . Colon cancer Neg Hx   . Esophageal cancer Neg Hx   . Liver cancer Neg Hx   . Pancreatic cancer Neg Hx   . Rectal cancer Neg Hx   . Stomach cancer Neg Hx     Social History   Tobacco Use  . Smoking status: Former Smoker    Types: Cigarettes  . Smokeless tobacco: Never Used  Substance Use Topics  . Alcohol use: No    Alcohol/week: 0.0 standard drinks    Subjective:  Patient presents to transfer from Dr. Loanne Drilling. Having hypertension, hyperlipidemia managed by primary care; Denies any chest pain, shortness of breath, blurred vision or headache.  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  Head: Normocephalic and atraumatic  Eyes: Sclera and conjunctiva clear; pupils round and reactive to light; extraocular movements intact  Ears: External normal; canals clear; tympanic membranes normal  Oropharynx: Pink, supple. No suspicious lesions  Neck: Supple without thyromegaly, adenopathy  Lungs: Respirations unlabored; clear to auscultation bilaterally without wheeze, rales, rhonchi  CVS exam: normal rate and regular rhythm.  Neurologic: Alert and oriented; speech intact; face symmetrical; moves all extremities well; CNII-XII intact without focal deficit   Assessment:  1. Osteopenia, unspecified location   2. Essential hypertension   3. Hyperlipidemia, unspecified hyperlipidemia type     Plan:  1. Update DEXA today; check Vitamin D level; 2. Stable; check CBC, CMP today; refills updated; 3. Stable; check lipid panel today; refills updated.    No follow-ups on file.  Orders Placed This Encounter  Procedures  . DG Bone Density    Standing Status:   Future    Standing Expiration Date:   07/25/2019    Order Specific Question:   Reason for Exam (SYMPTOM  OR DIAGNOSIS REQUIRED)    Answer:   osteopenia    Order Specific Question:   Preferred imaging location?    Answer:   [facility]  . CBC w/Diff    Standing Status:   Future    Number of Occurrences:   1    Standing Expiration Date:   05/26/2019  . Comp Met (CMET)    Standing Status:   Future    Number of Occurrences:   1    Standing Expiration Date:   05/26/2019  . Lipid panel    Standing Status:   Future    Number of Occurrences:   1    Standing  Expiration Date:   05/27/2019  . Vitamin D (25 hydroxy)    Standing Status:   Future    Number of Occurrences:   1    Standing Expiration Date:   07/25/2018    Requested Prescriptions   Signed Prescriptions Disp Refills  . telmisartan-hydrochlorothiazide (MICARDIS HCT) 80-25 MG tablet 90 tablet 3    Sig: Take 1 tablet  by mouth daily.  Marland Kitchen amLODipine (NORVASC) 5 MG tablet 90 tablet 3    Sig: Take 1 tablet (5 mg total) by mouth daily.  . simvastatin (ZOCOR) 20 MG tablet 90 tablet 3    Sig: Take 1 tablet (20 mg total) by mouth at bedtime.

## 2018-05-27 ENCOUNTER — Encounter: Payer: Worker's Compensation | Admitting: Family

## 2018-06-12 ENCOUNTER — Ambulatory Visit (INDEPENDENT_AMBULATORY_CARE_PROVIDER_SITE_OTHER)
Admission: RE | Admit: 2018-06-12 | Discharge: 2018-06-12 | Disposition: A | Discharge status: Home / Self Care | Payer: BC Managed Care – PPO | Source: Ambulatory Visit | Attending: Family | Admitting: Family

## 2018-06-12 DIAGNOSIS — Z780 Asymptomatic menopausal state: Secondary | ICD-10-CM

## 2018-06-12 DIAGNOSIS — M8580 Other specified disorders of bone density and structure, unspecified site: Secondary | ICD-10-CM

## 2018-06-14 DIAGNOSIS — Z780 Asymptomatic menopausal state: Secondary | ICD-10-CM | POA: Diagnosis not present

## 2018-06-23 ENCOUNTER — Ambulatory Visit
Admission: RE | Admit: 2018-06-23 | Discharge: 2018-06-23 | Disposition: A | Discharge status: Home / Self Care | Payer: BC Managed Care – PPO | Source: Ambulatory Visit | Attending: Gynecology | Admitting: Gynecology

## 2018-06-23 DIAGNOSIS — Z1231 Encounter for screening mammogram for malignant neoplasm of breast: Secondary | ICD-10-CM

## 2018-12-31 ENCOUNTER — Other Ambulatory Visit: Payer: Self-pay

## 2018-12-31 ENCOUNTER — Ambulatory Visit (INDEPENDENT_AMBULATORY_CARE_PROVIDER_SITE_OTHER): Payer: BC Managed Care – PPO

## 2018-12-31 ENCOUNTER — Ambulatory Visit: Payer: BC Managed Care – PPO | Admitting: Family

## 2018-12-31 DIAGNOSIS — Z23 Encounter for immunization: Secondary | ICD-10-CM

## 2019-05-21 IMAGING — MG DIGITAL SCREENING BILATERAL MAMMOGRAM WITH TOMO AND CAD
2 series · 2 of 10 positions shown · non-contrast
Comparison: Previous exam(s).

CLINICAL DATA: Screening.

EXAM:
DIGITAL SCREENING BILATERAL MAMMOGRAM WITH TOMO AND CAD

[L CC tomo · tomo slice 43/86.0]
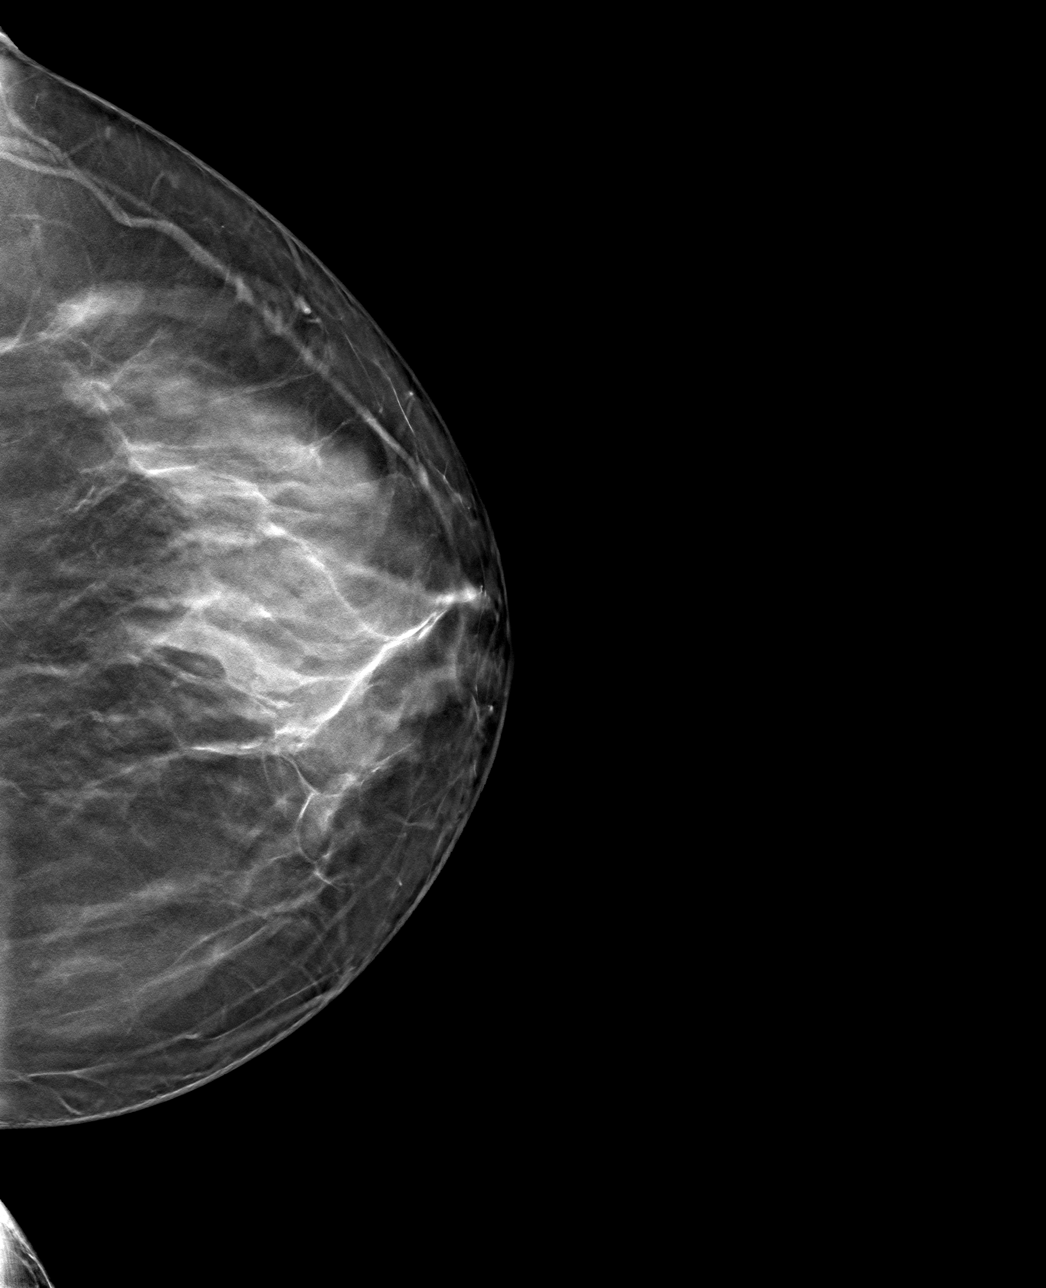

[R MLO tomo · tomo slice 45/89.0]
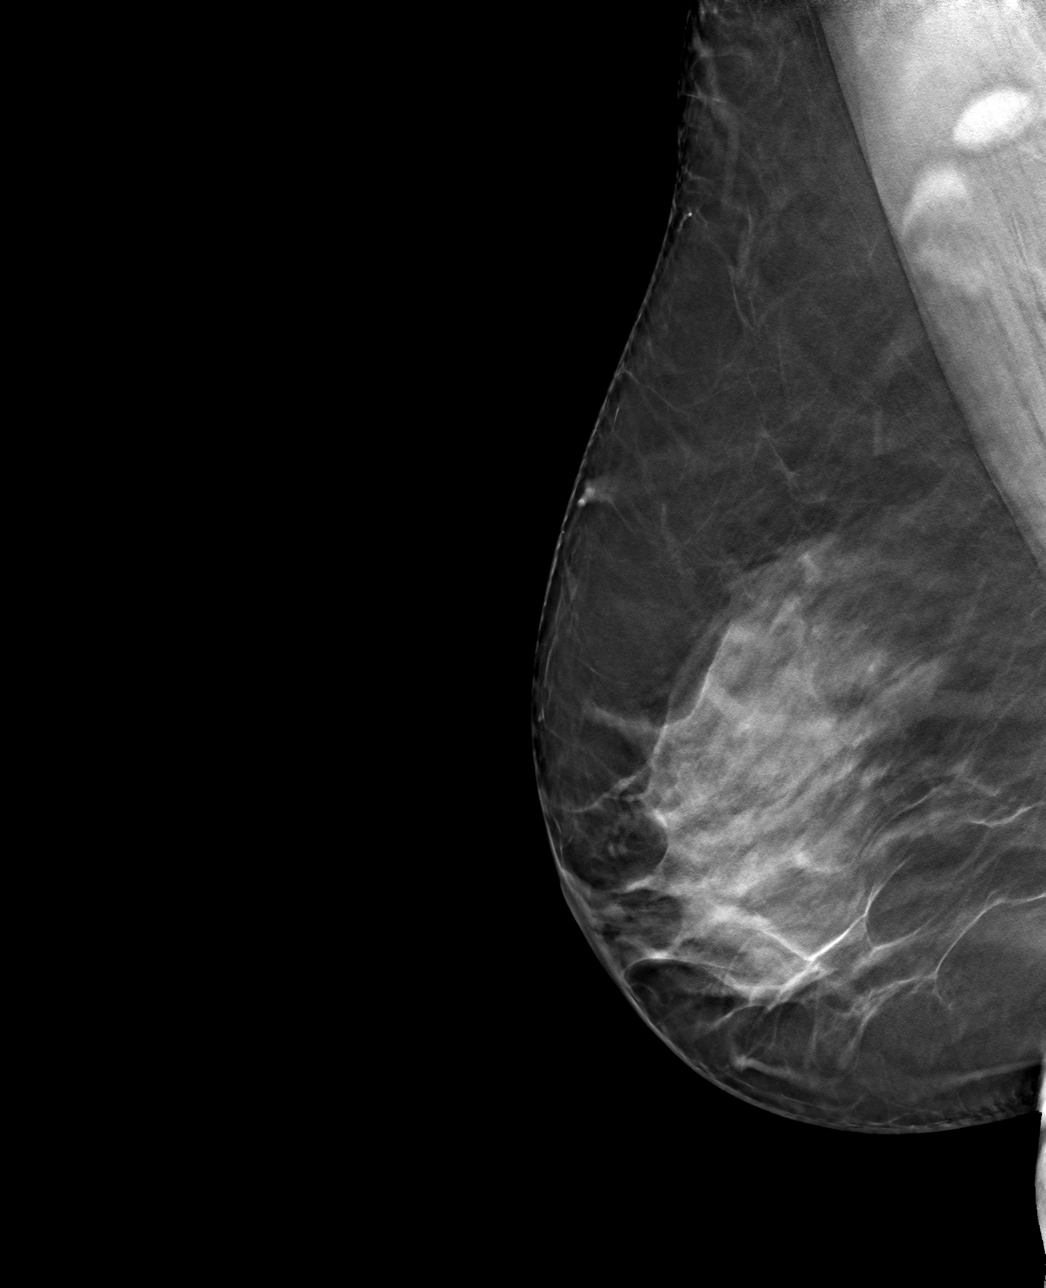

[2 of 10 positions shown; findings below may reference images not displayed]

ACR Breast Density Category b: There are scattered areas of
fibroglandular density.
FINDINGS: There are no findings suspicious for malignancy. Images were
processed with CAD.
IMPRESSION: No mammographic evidence of malignancy. A result letter of this
screening mammogram will be mailed directly to the patient.

RECOMMENDATION:
Screening mammogram in one year. (Code:CN-U-775)

BI-RADS CATEGORY  1: Negative.

## 2019-05-27 ENCOUNTER — Other Ambulatory Visit: Payer: Self-pay | Admitting: Gynecology

## 2019-05-27 DIAGNOSIS — Z1231 Encounter for screening mammogram for malignant neoplasm of breast: Secondary | ICD-10-CM

## 2019-06-29 ENCOUNTER — Other Ambulatory Visit: Payer: Self-pay

## 2019-06-29 ENCOUNTER — Ambulatory Visit
Admission: RE | Admit: 2019-06-29 | Discharge: 2019-06-29 | Disposition: A | Discharge status: Home / Self Care | Payer: BC Managed Care – PPO | Source: Ambulatory Visit | Attending: Gynecology | Admitting: Gynecology

## 2019-06-29 DIAGNOSIS — Z1231 Encounter for screening mammogram for malignant neoplasm of breast: Secondary | ICD-10-CM

## 2019-08-14 ENCOUNTER — Telehealth: Payer: Self-pay | Admitting: Family

## 2019-08-14 NOTE — Telephone Encounter (Signed)
She needs an OV to follow-up on her blood pressure; was last seen here in January 2020;

## 2019-08-21 ENCOUNTER — Other Ambulatory Visit: Payer: Self-pay

## 2019-08-21 ENCOUNTER — Ambulatory Visit: Payer: BC Managed Care – PPO | Admitting: Family

## 2019-08-21 ENCOUNTER — Encounter: Payer: Self-pay | Admitting: Family

## 2019-08-21 VITALS — BP 140/76 | HR 70 | Temp 98.2°F | Resp 16 | Ht 67.0 in | Wt 243.0 lb

## 2019-08-21 DIAGNOSIS — Z6838 Body mass index (BMI) 38.0-38.9, adult: Secondary | ICD-10-CM

## 2019-08-21 DIAGNOSIS — I10 Essential (primary) hypertension: Secondary | ICD-10-CM

## 2019-08-21 MED ORDER — TELMISARTAN-HCTZ 80-25 MG PO TABS: 1.0000 | ORAL_TABLET | Freq: Every day | ORAL | 3 refills | Status: DC

## 2019-08-21 MED ORDER — SPIRONOLACTONE 25 MG PO TABS: 25.0000 mg | ORAL_TABLET | Freq: Every day | ORAL | 1 refills | Status: DC

## 2019-08-21 NOTE — Progress Notes (Signed)
Tonya Taylor is a 64 y.o. female with the following history as recorded in Epic[employer]:  Patient Active Problem List   Diagnosis Date Noted  . Back pain 03/20/2017  . URI (upper respiratory infection) 07/24/2016  . Vitamin D deficiency 03/16/2015  . Wellness examination 03/01/2014  . Hyperglycemia 02/20/2013  . Menopausal state 03/05/2012  . Abnormal ECG 02/19/2011  . NASH (nonalcoholic steatohepatitis) 02/07/2010  . NEUTROPENIA UNSPECIFIED 01/31/2009  . Iron deficiency anemia due to chronic blood loss 04/05/2008  . FOOT PAIN, RIGHT 04/05/2008  . ANKLE PAIN, LEFT 01/02/2008  . EDEMA 01/02/2008  . Dyslipidemia 02/01/2007  . Essential hypertension 02/01/2007  . INSOMNIA 02/01/2007  . TOBACCO USE, QUIT 02/01/2007    Current Outpatient Medications  Medication Sig Dispense Refill  . Ascorbic Acid (VITAMIN C) 1000 MG tablet Take 1,000 mg by mouth daily.      Marland Kitchen aspirin 81 MG tablet Take 81 mg by mouth daily.      . Calcium Carbonate-Vit D-Min (CALCIUM 600+D PLUS MINERALS PO) Take 2 tablets by mouth daily.      . cetirizine (ZYRTEC) 10 MG tablet Take 10 mg by mouth daily.      . Cholecalciferol (D 5000) 5000 UNITS TABS Take 1 tablet by mouth daily.      . Coenzyme Q10 (COQ10 PO) Take 1 capsule by mouth daily.      Mariane Baumgarten Calcium (STOOL SOFTENER PO) Take 2 tablets by mouth daily.    Marland Kitchen ECHINACEA PO Take 1 capsule by mouth daily. 736m     . guaiFENesin (MUCINEX) 600 MG 12 hr tablet Take by mouth 2 (two) times daily.    . Magnesium 400 MG CAPS Take 1 capsule by mouth daily.      . Multiple Vitamin (MULTIVITAMIN) tablet Take 1 tablet by mouth daily.      . Omega-3 1400 MG CAPS Take 1 capsule by mouth daily.      .Marland KitchenOVER THE COUNTER MEDICATION Hair, skin, nails gummies- 1 po daily    . simvastatin (ZOCOR) 20 MG tablet Take 1 tablet (20 mg total) by mouth at bedtime. 90 tablet 3  . telmisartan-hydrochlorothiazide (MICARDIS HCT) 80-25 MG tablet Take 1 tablet by mouth daily. 90 tablet 3   . vitamin B-12 (CYANOCOBALAMIN) 1000 MCG tablet Take 1,000 mcg by mouth daily.      . vitamin E 1000 UNIT capsule Take 1,000 Units by mouth daily.      .Marland Kitchenspironolactone (ALDACTONE) 25 MG tablet Take 1 tablet (25 mg total) by mouth daily. 90 tablet 1   No current facility-administered medications for this visit.    Allergies: Patient has no known allergies.  Past Medical History:  Diagnosis Date  . Allergy   . ANEMIA DUE TO CHRONIC BLOOD LOSS 04/05/2008  . Edema 01/02/2008  . Glaucoma   . HYPERLIPIDEMIA 02/01/2007  . HYPERTENSION 02/01/2007  . INSOMNIA 02/01/2007  . NEUTROPENIA UNSPECIFIED 01/31/2009  . Osteopenia   . Other chronic nonalcoholic liver disease 124/06/3534 . TOBACCO USE, QUIT 02/01/2007    Past Surgical History:  Procedure Laterality Date  . COLONOSCOPY    . DENTAL SURGERY    . HAND SURGERY     removed a nerve   . KNEE ARTHROSCOPY Left   . ORIF SCAPHOID FRACTURE Right 07/08/2017   Procedure: OPEN REDUCTION INTERNAL FIXATION OF RIGHT SCAPHOID FRACTURE;  Surgeon: TMilly Jakob MD;  Location[facility]  Service: Orthopedics;  Laterality: Right;    Family History  Problem Relation Age of Onset  . Cancer Mother        Lung Cancer  . Lung cancer Mother   . Prostate cancer Father   . Colon cancer Neg Hx   . Esophageal cancer Neg Hx   . Liver cancer Neg Hx   . Pancreatic cancer Neg Hx   . Rectal cancer Neg Hx   . Stomach cancer Neg Hx     Social History   Tobacco Use  . Smoking status: Former Smoker    Types: Cigarettes  . Smokeless tobacco: Never Used  Substance Use Topics  . 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   Objective:  Vitals:   08/21/19 1552  BP: 140/76  Pulse: 70  Resp: 16  Temp: 98.2 F (36.8 C)  TempSrc: Oral  SpO2: 98%  Weight: 243 lb (110.2 kg)  Height: 5' 7"  (1.702 m)    General: Well developed, well nourished, in no acute distress  Skin : Warm and dry.  Head: Normocephalic and atraumatic  Eyes: Sclera and conjunctiva clear; pupils round and reactive to light; extraocular movements intact  Ears: External normal; canals clear; tympanic membranes normal  Oropharynx: Pink, supple. No suspicious lesions  Neck: Supple without thyromegaly, adenopathy  Lungs: Respirations unlabored; clear to auscultation bilaterally without wheeze, rales, rhonchi  CVS exam: normal rate and regular rhythm.  Extremities: No pitting edema, cyanosis, clubbing  Vessels: Symmetric bilaterally  Neurologic: Alert and oriented; speech intact; face symmetrical; moves all extremities well; CNII-XII intact without focal deficit  Assessment:  1. Essential hypertension   2. Class 2 severe obesity with serious comorbidity and body mass index (BMI) of 38.0 to 38.9 in adult, unspecified obesity type ([place])     Plan:  Will d/c Amlodipine due to concern for ankle swelling; continue Telmisartan HCT and add Spironolactone 25 mg daily; follow-up in 1 month to check response; Refer to [facility];   Will plan to update labs/ CPE at next OV; patient will fast for 6 hours prior to next OV.   This visit occurred during the SARS-CoV-2 public health emergency.  Safety protocols were in place, including screening questions prior to the visit, additional usage of staff PPE, and extensive cleaning of exam room while observing appropriate contact time as indicated for disinfecting solutions.     Return in about 1 month (around 09/20/2019).  Orders Placed This Encounter  Procedures  . Amb Ref to Medical Weight  Management    Referral Priority:   Routine    Referral Type:   Consultation    Number of Visits Requested:   1    Requested Prescriptions   Signed Prescriptions Disp Refills  . spironolactone (ALDACTONE) 25 MG tablet 90 tablet 1    Sig: Take 1 tablet (25 mg total) by mouth daily.  Marland Kitchen telmisartan-hydrochlorothiazide (MICARDIS HCT) 80-25 MG tablet 90 tablet 3    Sig: Take 1 tablet by mouth daily.

## 2019-09-21 ENCOUNTER — Encounter: Payer: Self-pay | Admitting: Family

## 2019-09-21 ENCOUNTER — Other Ambulatory Visit: Payer: Self-pay

## 2019-09-21 ENCOUNTER — Ambulatory Visit (INDEPENDENT_AMBULATORY_CARE_PROVIDER_SITE_OTHER): Payer: BC Managed Care – PPO | Admitting: Family

## 2019-09-21 VITALS — BP 130/80 | HR 60 | Temp 98.2°F | Ht 67.0 in | Wt 241.4 lb

## 2019-09-21 DIAGNOSIS — Z0000 Encounter for general adult medical examination without abnormal findings: Secondary | ICD-10-CM | POA: Diagnosis not present

## 2019-09-21 DIAGNOSIS — Z1321 Encounter for screening for nutritional disorder: Secondary | ICD-10-CM

## 2019-09-21 DIAGNOSIS — Z13220 Encounter for screening for lipoid disorders: Secondary | ICD-10-CM | POA: Diagnosis not present

## 2019-09-21 DIAGNOSIS — Z23 Encounter for immunization: Secondary | ICD-10-CM | POA: Diagnosis not present

## 2019-09-21 NOTE — Progress Notes (Signed)
Tonya Taylor is a 64 y.o. female with the following history as recorded in EpicCare:  Patient Active Problem List   Diagnosis Date Noted  . Back pain 03/20/2017  . URI (upper respiratory infection) 07/24/2016  . Vitamin D deficiency 03/16/2015  . Wellness examination 03/01/2014  . Hyperglycemia 02/20/2013  . Menopausal state 03/05/2012  . Abnormal ECG 02/19/2011  . NASH (nonalcoholic steatohepatitis) 02/07/2010  . NEUTROPENIA UNSPECIFIED 01/31/2009  . Iron deficiency anemia due to chronic blood loss 04/05/2008  . FOOT PAIN, RIGHT 04/05/2008  . ANKLE PAIN, LEFT 01/02/2008  . EDEMA 01/02/2008  . Dyslipidemia 02/01/2007  . Essential hypertension 02/01/2007  . INSOMNIA 02/01/2007  . TOBACCO USE, QUIT 02/01/2007    Current Outpatient Medications  Medication Sig Dispense Refill  . Ascorbic Acid (VITAMIN C) 1000 MG tablet Take 1,000 mg by mouth daily.      Marland Kitchen aspirin 81 MG tablet Take 81 mg by mouth daily.      . Calcium Carbonate-Vit D-Min (CALCIUM 600+D PLUS MINERALS PO) Take 2 tablets by mouth daily.      . cetirizine (ZYRTEC) 10 MG tablet Take 10 mg by mouth daily.      . Cholecalciferol (D 5000) 5000 UNITS TABS Take 1 tablet by mouth daily.      . Coenzyme Q10 (COQ10 PO) Take 1 capsule by mouth daily.      Mariane Baumgarten Calcium (STOOL SOFTENER PO) Take 2 tablets by mouth daily.    Marland Kitchen ECHINACEA PO Take 1 capsule by mouth daily. 768m     . guaiFENesin (MUCINEX) 600 MG 12 hr tablet Take by mouth 2 (two) times daily.    . Magnesium 400 MG CAPS Take 1 capsule by mouth daily.      . meloxicam (MOBIC) 7.5 MG tablet meloxicam 7.5 mg tablet  TAKE 1 TABLET BY MOUTH EVERY DAY WITH FOOD AS NEEDED FOR PAIN/INFLAMMATION    . Multiple Vitamin (MULTIVITAMIN) tablet Take 1 tablet by mouth daily.      . Omega-3 1400 MG CAPS Take 1 capsule by mouth daily.      .Marland KitchenOVER THE COUNTER MEDICATION Hair, skin, nails gummies- 1 po daily    . simvastatin (ZOCOR) 20 MG tablet Take 1 tablet (20 mg total) by  mouth at bedtime. 90 tablet 3  . spironolactone (ALDACTONE) 25 MG tablet Take 1 tablet (25 mg total) by mouth daily. 90 tablet 1  . telmisartan-hydrochlorothiazide (MICARDIS HCT) 80-25 MG tablet Take 1 tablet by mouth daily. 90 tablet 3  . vitamin B-12 (CYANOCOBALAMIN) 1000 MCG tablet Take 1,000 mcg by mouth daily.      . vitamin E 1000 UNIT capsule Take 1,000 Units by mouth daily.      . influenza vaccine (FLUCELVAX QUADRIVALENT) 0.5 ML injection Flucelvax Quad 2019-2020 (PF) 60 mcg (15 mcg x 4)/0.5 mL IM syringe  PHARMACIST ADMINISTERED IMMUNIZATION ADMINISTERED AT TIME OF DISPENSING     No current facility-administered medications for this visit.    Allergies: Patient has no known allergies.  Past Medical History:  Diagnosis Date  . Allergy   . ANEMIA DUE TO CHRONIC BLOOD LOSS 04/05/2008  . Edema 01/02/2008  . Glaucoma   . HYPERLIPIDEMIA 02/01/2007  . HYPERTENSION 02/01/2007  . INSOMNIA 02/01/2007  . NEUTROPENIA UNSPECIFIED 01/31/2009  . Osteopenia   . Other chronic nonalcoholic liver disease 150/06/7739 . TOBACCO USE, QUIT 02/01/2007    Past Surgical History:  Procedure Laterality Date  . COLONOSCOPY    . DENTAL SURGERY    .  HAND SURGERY     removed a nerve   . KNEE ARTHROSCOPY Left   . ORIF SCAPHOID FRACTURE Right 07/08/2017   Procedure: OPEN REDUCTION INTERNAL FIXATION OF RIGHT SCAPHOID FRACTURE;  Surgeon: Milly Jakob, MD;  Location: [facility];  Service: Orthopedics;  Laterality: Right;    Family History  Problem Relation Age of Onset  . Cancer Mother        Lung Cancer  . Lung cancer Mother   . Prostate cancer Father   . Colon cancer Neg Hx   . Esophageal cancer Neg Hx   . Liver cancer Neg Hx   . Pancreatic cancer Neg Hx   . Rectal cancer Neg Hx   . Stomach cancer Neg Hx     Social History   Tobacco Use  . Smoking status: Former Smoker    Types: Cigarettes  . Smokeless tobacco: Never Used  Substance Use Topics  . Alcohol use: No     Alcohol/week: 0.0 standard drinks    Subjective:  Presents for yearly CPE; has been feeling better with changes made in blood pressure regimen; ankle stopped swelling almost immediately after stopping Amlodipine; Does have GYN- scheduled to go there in June; up to date on dental and vision exams;   Review of Systems  Constitutional: Positive for weight loss.       Planned  HENT: Positive for congestion.   Eyes: Negative.   Respiratory: Negative.   Cardiovascular: Negative.   Gastrointestinal: Negative.   Genitourinary: Negative.   Musculoskeletal: Negative.   Skin: Negative.   Neurological: Negative.   Endo/Heme/Allergies: Negative.   Psychiatric/Behavioral: Negative.      Objective:  Vitals:   09/21/19 1603  BP: 130/80  Pulse: 60  Temp: 98.2 F (36.8 C)  TempSrc: Oral  SpO2: 98%  Weight: 241 lb 6.4 oz (109.5 kg)  Height: _0  (1.702 m)    General: Well developed, well nourished, in no acute distress  Skin : Warm and dry.  Head: Normocephalic and atraumatic  Eyes: Sclera and conjunctiva clear; pupils round and reactive to light; extraocular movements intact  Ears: External normal; canals clear; tympanic membranes normal  Oropharynx: Pink, supple. No suspicious lesions  Neck: Supple without thyromegaly, adenopathy  Lungs: Respirations unlabored; clear to auscultation bilaterally without wheeze, rales, rhonchi  CVS exam: normal rate and regular rhythm.  Abdomen: Soft; nontender; nondistended; normoactive bowel sounds; no masses or hepatosplenomegaly  Musculoskeletal: No deformities; no active joint inflammation  Extremities: No edema, cyanosis, clubbing  Vessels: Symmetric bilaterally  Neurologic: Alert and oriented; speech intact; face symmetrical; moves all extremities well; CNII-XII intact without focal deficit   Assessment:  1. PE (physical exam), annual   2. Lipid screening   3. Encounter for vitamin deficiency screening     Plan:  Age appropriate  preventive healthcare needs addressed; encouraged regular eye doctor and dental exams; encouraged regular exercise; will update labs and refills as needed today; follow-up to be determined; Continue with GYN and weight loss program as scheduled;  Shingrix #1 is given today; follow-up in 2 months for 2nd vaccine. Okay to follow-up here in 1 year, sooner prn.  This visit occurred during the SARS-CoV-2 public health emergency.  Safety protocols were in place, including screening questions prior to the visit, additional usage of staff PPE, and extensive cleaning of exam room while observing appropriate contact time as indicated for disinfecting solutions.      Return in about 2 months (around 11/21/2019) for Shingrix #2.  Orders Placed This Encounter  Procedures  . CBC with Differential/Platelet  . Comp Met (CMET)  . Lipid panel  . TSH  . Vitamin D (25 hydroxy)    Requested Prescriptions    No prescriptions requested or ordered in this encounter

## 2019-09-21 NOTE — Addendum Note (Signed)
Addended by: Marcina Millard on: 09/21/2019 05:00 PM   Modules accepted: Orders

## 2019-09-22 LAB — COMPREHENSIVE METABOLIC PANEL
ALT: 29 U/L (ref 0–35)
AST: 29 U/L (ref 0–37)
Albumin: 4.5 g/dL (ref 3.5–5.2)
Alkaline Phosphatase: 58 U/L (ref 39–117)
BUN: 12 mg/dL (ref 6–23)
CO2: 29 mEq/L (ref 19–32)
Calcium: 9.4 mg/dL (ref 8.4–10.5)
Chloride: 101 mEq/L (ref 96–112)
Creatinine, Ser: 0.96 mg/dL (ref 0.40–1.20)
GFR: 71.09 mL/min (ref >60.00)
Glucose, Bld: 81 mg/dL (ref 70–99)
Potassium: 4.1 mEq/L (ref 3.5–5.1)
Sodium: 137 mEq/L (ref 135–145)
Total Bilirubin: 0.5 mg/dL (ref 0.2–1.2)
Total Protein: 8.3 g/dL (ref 6.0–8.3)

## 2019-09-22 LAB — CBC WITH DIFFERENTIAL/PLATELET
Basophils Absolute: 0.1 10*3/uL (ref 0.0–0.1)
Basophils Relative: 1.7 % (ref 0.0–3.0)
Eosinophils Absolute: 0.2 10*3/uL (ref 0.0–0.7)
Eosinophils Relative: 3.9 % (ref 0.0–5.0)
HCT: 44.4 % (ref 36.0–46.0)
Hemoglobin: 14.9 g/dL (ref 12.0–15.0)
Lymphocytes Relative: 43.2 % (ref 12.0–46.0)
Lymphs Abs: 2 10*3/uL (ref 0.7–4.0)
MCHC: 33.6 g/dL (ref 30.0–36.0)
MCV: 88.7 fl (ref 78.0–100.0)
Monocytes Absolute: 0.7 10*3/uL (ref 0.1–1.0)
Monocytes Relative: 14.8 % — ABNORMAL HIGH (ref 3.0–12.0)
Neutro Abs: 1.7 10*3/uL (ref 1.4–7.7)
Neutrophils Relative %: 36.4 % — ABNORMAL LOW (ref 43.0–77.0)
Platelets: 220 10*3/uL (ref 150.0–400.0)
RBC: 5.01 Mil/uL (ref 3.87–5.11)
RDW: 13.9 % (ref 11.5–15.5)
WBC: 4.5 10*3/uL (ref 4.0–10.5)

## 2019-09-22 LAB — LIPID PANEL
Cholesterol: 192 mg/dL (ref 0–200)
HDL: 55.4 mg/dL (ref >39.00)
LDL Cholesterol: 118 mg/dL — ABNORMAL HIGH (ref 0–99)
NonHDL: 137.06
Total CHOL/HDL Ratio: 3
Triglycerides: 94 mg/dL (ref 0.0–149.0)
VLDL: 18.8 mg/dL (ref 0.0–40.0)

## 2019-09-22 LAB — TSH: TSH: 3.19 u[IU]/mL (ref 0.35–4.50)

## 2019-09-23 LAB — VITAMIN D 25 HYDROXY (VIT D DEFICIENCY, FRACTURES): VITD: 55.07 ng/mL (ref 30.00–100.00)

## 2019-09-30 ENCOUNTER — Ambulatory Visit (INDEPENDENT_AMBULATORY_CARE_PROVIDER_SITE_OTHER): Payer: BC Managed Care – PPO | Admitting: Family Medicine

## 2019-09-30 ENCOUNTER — Other Ambulatory Visit: Payer: Self-pay

## 2019-09-30 ENCOUNTER — Encounter (INDEPENDENT_AMBULATORY_CARE_PROVIDER_SITE_OTHER): Payer: Self-pay | Admitting: Family Medicine

## 2019-09-30 VITALS — BP 173/92 | HR 71 | Temp 97.6°F | Ht 67.0 in | Wt 237.0 lb

## 2019-09-30 DIAGNOSIS — Z0289 Encounter for other administrative examinations: Secondary | ICD-10-CM

## 2019-09-30 DIAGNOSIS — R5383 Other fatigue: Secondary | ICD-10-CM

## 2019-09-30 DIAGNOSIS — I10 Essential (primary) hypertension: Secondary | ICD-10-CM | POA: Diagnosis not present

## 2019-09-30 DIAGNOSIS — R0602 Shortness of breath: Secondary | ICD-10-CM | POA: Diagnosis not present

## 2019-09-30 DIAGNOSIS — E559 Vitamin D deficiency, unspecified: Secondary | ICD-10-CM

## 2019-09-30 DIAGNOSIS — Z1331 Encounter for screening for depression: Secondary | ICD-10-CM

## 2019-09-30 DIAGNOSIS — Z9189 Other specified personal risk factors, not elsewhere classified: Secondary | ICD-10-CM | POA: Diagnosis not present

## 2019-09-30 DIAGNOSIS — Z6837 Body mass index (BMI) 37.0-37.9, adult: Secondary | ICD-10-CM

## 2019-09-30 DIAGNOSIS — E7849 Other hyperlipidemia: Secondary | ICD-10-CM | POA: Diagnosis not present

## 2019-09-30 NOTE — Progress Notes (Signed)
Dear Tonya Taylor, [name],   Thank you for referring Tonya Taylor to our clinic. The following note includes my evaluation and treatment recommendations.  Chief Complaint:   OBESITY Tonya Taylor (MR# [id number]) is a 64 y.o. female who presents for evaluation and treatment of obesity and related comorbidities. Current BMI is Body mass index is 37.12 kg/m. Tonya Taylor has been struggling with her weight for many years and has been unsuccessful in either losing weight, maintaining weight loss, or reaching her healthy weight goal.  Tonya Taylor is currently in the action stage of change and ready to dedicate time achieving and maintaining a healthier weight. Tonya Taylor is interested in becoming our patient and working on intensive lifestyle modifications including (but not limited to) diet and exercise for weight loss.  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  She feels she has gained more weight recently with working from home.  She is skipping breakfast several times a week.  She may skip breakfast and have a bag of popcorn, apple, banana, or yogurt instead.  Lunch will be popcorn.  Dinner consists of salad, asparagus, salmon, and rice (1/2 cup).  After dinner, 1 oatmeal cookie.  Tonya Taylor habits were reviewed today and are as follows: Her family eats meals together, she thinks her family will eat healthier with her, her desired weight loss is 53 pounds, she started gaining weight last year, her heaviest weight ever was her current weight, she craves sweets, she skips breakfast often and she struggles with emotional eating.  Depression Screen Tonya Taylor's Food and Mood (modified PHQ-9) score was 3.  Depression screen [facility] 2/9 09/30/2019  Decreased Interest 0  Down, Depressed, Hopeless 0  PHQ - 2 Score 0  Altered sleeping 1  Tired, decreased energy 1  Change in appetite 1  Feeling bad or failure about yourself  0  Trouble concentrating 0  Moving slowly or  fidgety/restless 0  Suicidal thoughts 0  PHQ-9 Score 3  Difficult doing work/chores Not difficult at all   Subjective:   1. Other fatigue Tonya Taylor denies daytime somnolence and admits to waking up still tired. Patent has a history of symptoms of morning fatigue and snoring. Tonya Taylor generally gets 6 or 7 hours of sleep per night, and states that she has poor quality sleep. Snoring is present. Apneic episodes are present. Epworth Sleepiness Score is 5.  2. SOB (shortness of breath) on exertion Tonya Taylor notes increasing shortness of breath with exercising and seems to be worsening over time with weight gain. She notes getting out of breath sooner with activity than she used to. This has not gotten worse recently. Tonya Taylor denies shortness of breath at rest or orthopnea.  3. Essential hypertension Review: taking medications as instructed, no medication side effects noted, no chest pain on exertion, no dyspnea on exertion, no swelling of ankles.  Blood pressure is normally well-controlled.  She was diagnosed many years ago.  She was previously on amlodipine.  Now takes telmisartan-HCTZ and spironolactone.  BP Readings from Last 3 Encounters:  09/30/19 (!) 173/92  09/21/19 130/80  08/21/19 140/76   4. Other hyperlipidemia Tonya Taylor has hyperlipidemia and has been trying to improve her cholesterol levels with intensive lifestyle modification including a low saturated fat diet, exercise and weight loss. She denies any chest pain, claudication or myalgias.  She has been on simvastatin for years.  Lab Results  Component Value Date   ALT 29 09/21/2019   AST 29 09/21/2019   ALKPHOS  58 09/21/2019   BILITOT 0.5 09/21/2019   Lab Results  Component Value Date   CHOL 192 09/21/2019   HDL 55.40 09/21/2019   LDLCALC 118 (H) 09/21/2019   TRIG 94.0 09/21/2019   CHOLHDL 3 09/21/2019   5. Vitamin D deficiency Tonya Taylor Vitamin D level was 32.44 on 05/26/2018. She is currently taking OTC vitamin D  2000 IU each day. She denies nausea, vomiting or muscle weakness.  She endorses fatigue.  6. Depression screening Tonya Taylor was screened for depression as part of her new patient workup.  PHQ-9 is 3.  7. At risk for osteoporosis Tonya Taylor is at higher risk of osteopenia and osteoporosis due to Vitamin D deficiency.   Assessment/Plan:   1. Other fatigue Tonya Taylor does not feel that her weight is causing her energy to be lower than it should be. Fatigue may be related to obesity, depression or many other causes. Labs will be ordered, and in the meanwhile, Tonya Taylor will focus on self care including making healthy food choices, increasing physical activity and focusing on stress reduction. - EKG 12-Lead - Hemoglobin A1c - Insulin, random - Vitamin B12 - Folate - T3 - T4  2. SOB (shortness of breath) on exertion Tonya Taylor does not feel that she gets out of breath more easily that she used to when she exercises. Tonya Taylor shortness of breath appears to be obesity related and exercise induced. She has agreed to work on weight loss and gradually increase exercise to treat her exercise induced shortness of breath. Will continue to monitor closely. - Hemoglobin A1c - Insulin, random - Vitamin B12 - Folate - T3 - T4  3. Essential hypertension Tonya Taylor is working on healthy weight loss and exercise to improve blood pressure control. We will watch for signs of hypotension as she continues her lifestyle modifications.  4. Other hyperlipidemia Cardiovascular risk and specific lipid/LDL goals reviewed.  We discussed several lifestyle modifications today and Tonya Taylor will continue to work on diet, exercise and weight loss efforts. Orders and follow up as documented in patient record.   Counseling Intensive lifestyle modifications are the first line treatment for this issue. . Dietary changes: Increase soluble fiber. Decrease simple carbohydrates. . Exercise changes: Moderate to vigorous-intensity  aerobic activity 150 minutes per week if tolerated. . Lipid-lowering medications: see documented in medical record. - Hemoglobin A1c - Insulin, random  5. Vitamin D deficiency Low Vitamin D level contributes to fatigue and are associated with obesity, breast, and colon cancer. She agrees to continue to take prescription Vitamin D @50 ,000 IU every week and will follow-up for routine testing of Vitamin D, at least 2-3 times per year to avoid over-replacement.  6. Depression screening Depression screen was negative.  7. At risk for osteoporosis Tonya Taylor was given approximately 15 minutes of osteoporosis prevention counseling today. Tonya Taylor is at risk for osteopenia and osteoporosis due to her Vitamin D deficiency. She was encouraged to take her Vitamin D and follow her higher calcium diet and increase strengthening exercise to help strengthen her bones and decrease her risk of osteopenia and osteoporosis.  Repetitive spaced learning was employed today to elicit superior memory formation and behavioral change.  8. Class 2 severe obesity with serious comorbidity and body mass index (BMI) of 37.0 to 37.9 in adult, unspecified obesity type ([place]) Tonya Taylor is currently in the action stage of change and her goal is to continue with weight loss efforts. I recommend Tonya Taylor begin the structured treatment plan as follows:  She has agreed to the Category 3  Plan with 8 ounces protein at dinner.  Exercise goals: As is.   Behavioral modification strategies: increasing lean protein intake, meal planning and cooking strategies, keeping healthy foods in the home and planning for success.  She was informed of the importance of frequent follow-up visits to maximize her success with intensive lifestyle modifications for her multiple health conditions. She was informed we would discuss her lab results at her next visit unless there is a critical issue that needs to be addressed sooner. Tonya Taylor agreed to keep her  next visit at the agreed upon time to discuss these results.  Objective:   Blood pressure (!) 173/92, pulse 71, temperature 97.6 F (36.4 C), temperature source Oral, height 5' 7"  (1.702 m), weight 237 lb (107.5 kg), SpO2 99 %. Body mass index is 37.12 kg/m.  EKG: Normal sinus rhythm, rate 67 bpm.  Decreased R-wave progression in V2 and V3.  Indirect Calorimeter completed today shows a VO2 of 246 and a REE of 1715.  Her calculated basal metabolic rate is 7371 thus her basal metabolic rate is worse than expected.  General: Cooperative, alert, well developed, in no acute distress. HEENT: Conjunctivae and lids unremarkable. Cardiovascular: Regular rhythm.  Lungs: Normal work of breathing. Neurologic: No focal deficits.   Lab Results  Component Value Date   CREATININE 0.96 09/21/2019   BUN 12 09/21/2019   NA 137 09/21/2019   K 4.1 09/21/2019   CL 101 09/21/2019   CO2 29 09/21/2019   Lab Results  Component Value Date   ALT 29 09/21/2019   AST 29 09/21/2019   ALKPHOS 58 09/21/2019   BILITOT 0.5 09/21/2019   Lab Results  Component Value Date   HGBA1C 5.6 03/19/2016   HGBA1C 5.5 03/16/2015   HGBA1C 5.6 03/01/2014   Lab Results  Component Value Date   TSH 3.19 09/21/2019   Lab Results  Component Value Date   CHOL 192 09/21/2019   HDL 55.40 09/21/2019   LDLCALC 118 (H) 09/21/2019   TRIG 94.0 09/21/2019   CHOLHDL 3 09/21/2019   Lab Results  Component Value Date   WBC 4.5 09/21/2019   HGB 14.9 09/21/2019   HCT 44.4 09/21/2019   MCV 88.7 09/21/2019   PLT 220.0 09/21/2019   Lab Results  Component Value Date   IRON 61 03/20/2017   Attestation Statements:   This is the patient's first visit at [facility]. The patient's NEW PATIENT PACKET was reviewed at length. Included in the packet: current and past health history, medications, allergies, ROS, gynecologic history (women only), surgical history, family history, social history, weight history, weight  loss surgery history (for those that have had weight loss surgery), nutritional evaluation, mood and food questionnaire, PHQ9, Epworth questionnaire, sleep habits questionnaire, patient life and health improvement goals questionnaire. These will all be scanned into the patient's chart under media.   During the visit, I independently reviewed the patient's EKG, bioimpedance scale results, and indirect calorimeter results. I used this information to tailor a meal plan for the patient that will help her to lose weight and will improve her obesity-related conditions going forward. I performed a medically necessary appropriate examination and/or evaluation. I discussed the assessment and treatment plan with the patient. The patient was provided an opportunity to ask questions and all were answered. The patient agreed with the plan and demonstrated an understanding of the instructions. Labs were ordered at this visit and will be reviewed at the next visit unless more critical results need to  be addressed immediately. Clinical information was updated and documented in the EMR.   Time spent on visit including pre-visit chart review and post-visit care was 45 minutes.   A separate 15 minutes was spent on risk counseling (see above).    I, Water quality scientist, CMA, am acting as transcriptionist for Coralie Common, MD.  I have reviewed the above documentation for accuracy and completeness, and I agree with the above. - Jinny Blossom, MD

## 2019-10-01 LAB — T4: T4, Total: 7 ug/dL (ref 4.5–12.0)

## 2019-10-01 LAB — HEMOGLOBIN A1C
Est. average glucose Bld gHb Est-mCnc: 114 mg/dL
Hgb A1c MFr Bld: 5.6 % (ref 4.8–5.6)

## 2019-10-01 LAB — INSULIN, RANDOM: INSULIN: 21.5 u[IU]/mL (ref 2.6–24.9)

## 2019-10-01 LAB — T3: T3, Total: 115 ng/dL (ref 71–180)

## 2019-10-01 LAB — VITAMIN B12: Vitamin B-12: >2000 pg/mL — ABNORMAL HIGH (ref 232–1245)

## 2019-10-01 LAB — FOLATE: Folate: 16.7 ng/mL (ref >3.0)

## 2019-10-14 ENCOUNTER — Ambulatory Visit (INDEPENDENT_AMBULATORY_CARE_PROVIDER_SITE_OTHER): Payer: BC Managed Care – PPO | Admitting: Family Medicine

## 2019-10-14 ENCOUNTER — Encounter (INDEPENDENT_AMBULATORY_CARE_PROVIDER_SITE_OTHER): Payer: Self-pay | Admitting: Family Medicine

## 2019-10-14 ENCOUNTER — Other Ambulatory Visit: Payer: Self-pay

## 2019-10-14 VITALS — BP 124/76 | HR 75 | Temp 98.3°F | Ht 67.0 in | Wt 231.0 lb

## 2019-10-14 DIAGNOSIS — I10 Essential (primary) hypertension: Secondary | ICD-10-CM

## 2019-10-14 DIAGNOSIS — E8881 Metabolic syndrome: Secondary | ICD-10-CM

## 2019-10-14 DIAGNOSIS — E7849 Other hyperlipidemia: Secondary | ICD-10-CM | POA: Diagnosis not present

## 2019-10-14 DIAGNOSIS — Z6836 Body mass index (BMI) 36.0-36.9, adult: Secondary | ICD-10-CM

## 2019-10-14 DIAGNOSIS — E88819 Insulin resistance, unspecified: Secondary | ICD-10-CM

## 2019-10-14 NOTE — Progress Notes (Signed)
Chief Complaint:   OBESITY Tonya Taylor is here to discuss her progress with her obesity treatment plan along with follow-up of her obesity related diagnoses. Tonya Taylor is on the Category 3 Plan and states she is following her eating plan approximately 98% of the time. Tonya Taylor states she is walking or using the elliptical for 20 minutes 7 times per week.  Today's visit was #: 2 Starting weight: 237 lbs Starting date: 09/30/2019 Today's weight: 231 lbs Today's date: 10/14/2019 Total lbs lost to date: 6 lbs Total lbs lost since last in-office visit: 6 lbs  Interim History: Tonya Taylor says she felt like she was eating all the time.  She says she also felt fairly full all the time.  She says she ate a significant portion of salad and spinach.  She was able to get I 8 ounces of protein at dinner.  For snacks, she is having 3 oatmeal cookies and 3 bags of popcorn.  Since her first appointment, she has had no cravings.  She has no trips or celebrations currently planned.  Subjective:   1. Insulin resistance Tonya Taylor has a diagnosis of insulin resistance based on her elevated fasting insulin level >5. She continues to work on diet and exercise to decrease her risk of diabetes.  She is not on any medications.  Lab Results  Component Value Date   INSULIN 21.5 09/30/2019   Lab Results  Component Value Date   HGBA1C 5.6 09/30/2019   2. Other hyperlipidemia Tonya Taylor has hyperlipidemia and has been trying to improve her cholesterol levels with intensive lifestyle modification including a low saturated fat diet, exercise and weight loss. She denies any chest pain, claudication or myalgias.  She is not on a statin.  Lab Results  Component Value Date   ALT 29 09/21/2019   AST 29 09/21/2019   ALKPHOS 58 09/21/2019   BILITOT 0.5 09/21/2019   Lab Results  Component Value Date   CHOL 192 09/21/2019   HDL 55.40 09/21/2019   LDLCALC 118 (H) 09/21/2019   TRIG 94.0 09/21/2019   CHOLHDL 3 09/21/2019    3. Essential hypertension Review: taking medications as instructed, no medication side effects noted, no chest pain on exertion, no dyspnea on exertion, no swelling of ankles.  Blood pressure is well-controlled today.  BP Readings from Last 3 Encounters:  10/14/19 124/76  09/30/19 (!) 173/92  09/21/19 130/80   Assessment/Plan:   1. Insulin resistance Tonya Taylor will continue to work on weight loss, exercise, and decreasing simple carbohydrates to help decrease the risk of diabetes. Tonya Taylor agreed to follow-up with [place] as directed to closely monitor her progress.  Follow-up labs in 3 months.  Defer metformin at this time.  2. Other hyperlipidemia Cardiovascular risk and specific lipid/LDL goals reviewed.  We discussed several lifestyle modifications today and Tonya Taylor will continue to work on diet, exercise and weight loss efforts. Orders and follow up as documented in patient record.   Counseling Intensive lifestyle modifications are the first line treatment for this issue. . Dietary changes: Increase soluble fiber. Decrease simple carbohydrates. . Exercise changes: Moderate to vigorous-intensity aerobic activity 150 minutes per week if tolerated. . Lipid-lowering medications: see documented in medical record.  3. Essential hypertension Tonya Taylor is working on healthy weight loss and exercise to improve blood pressure control. We will watch for signs of hypotension as she continues her lifestyle modifications.  4. Class 2 severe obesity with serious comorbidity and body mass index (BMI) of 36.0 to 36.9 in adult, unspecified  obesity type (Tonya Taylor) Tonya Taylor is currently in the action stage of change. As such, her goal is to continue with weight loss efforts. She has agreed to the Category 3 Plan.   Exercise goals: As is.  Behavioral modification strategies: increasing lean protein intake, meal planning and cooking strategies, keeping healthy foods in the home and better snacking  choices.  Tonya Taylor has agreed to follow-up with our clinic in 2 weeks. She was informed of the importance of frequent follow-up visits to maximize her success with intensive lifestyle modifications for her multiple health conditions.   Objective:   Blood pressure 124/76, pulse 75, temperature 98.3 F (36.8 C), temperature source Oral, height 5' 7"  (1.702 m), weight 231 lb (104.8 kg), SpO2 98 %. Body mass index is 36.18 kg/m.  General: Cooperative, alert, well developed, in no acute distress. HEENT: Conjunctivae and lids unremarkable. Cardiovascular: Regular rhythm.  Lungs: Normal work of breathing. Neurologic: No focal deficits.   Lab Results  Component Value Date   CREATININE 0.96 09/21/2019   BUN 12 09/21/2019   NA 137 09/21/2019   K 4.1 09/21/2019   CL 101 09/21/2019   CO2 29 09/21/2019   Lab Results  Component Value Date   ALT 29 09/21/2019   AST 29 09/21/2019   ALKPHOS 58 09/21/2019   BILITOT 0.5 09/21/2019   Lab Results  Component Value Date   HGBA1C 5.6 09/30/2019   HGBA1C 5.6 03/19/2016   HGBA1C 5.5 03/16/2015   HGBA1C 5.6 03/01/2014   Lab Results  Component Value Date   INSULIN 21.5 09/30/2019   Lab Results  Component Value Date   TSH 3.19 09/21/2019   Lab Results  Component Value Date   CHOL 192 09/21/2019   HDL 55.40 09/21/2019   LDLCALC 118 (H) 09/21/2019   TRIG 94.0 09/21/2019   CHOLHDL 3 09/21/2019   Lab Results  Component Value Date   WBC 4.5 09/21/2019   HGB 14.9 09/21/2019   HCT 44.4 09/21/2019   MCV 88.7 09/21/2019   PLT 220.0 09/21/2019   Lab Results  Component Value Date   IRON 61 03/20/2017   Attestation Statements:   Reviewed by clinician on day of visit: allergies, medications, problem list, medical history, surgical history, family history, social history, and previous encounter notes.  Time spent on visit including pre-visit chart review and post-visit care and charting was 25 minutes.   I, Water quality scientist, CMA, am acting  as transcriptionist for Coralie Common, MD.  I have reviewed the above documentation for accuracy and completeness, and I agree with the above. - Jinny Blossom, MD

## 2019-11-02 ENCOUNTER — Encounter (INDEPENDENT_AMBULATORY_CARE_PROVIDER_SITE_OTHER): Payer: Self-pay | Admitting: Physician Assistant

## 2019-11-02 ENCOUNTER — Ambulatory Visit (INDEPENDENT_AMBULATORY_CARE_PROVIDER_SITE_OTHER): Payer: BC Managed Care – PPO | Admitting: Physician Assistant

## 2019-11-02 ENCOUNTER — Other Ambulatory Visit: Payer: Self-pay

## 2019-11-02 VITALS — BP 133/87 | HR 71 | Temp 98.2°F | Ht 67.0 in | Wt 230.0 lb

## 2019-11-02 DIAGNOSIS — Z6836 Body mass index (BMI) 36.0-36.9, adult: Secondary | ICD-10-CM | POA: Diagnosis not present

## 2019-11-02 DIAGNOSIS — E7849 Other hyperlipidemia: Secondary | ICD-10-CM

## 2019-11-02 NOTE — Progress Notes (Signed)
Chief Complaint:   OBESITY Tonya Taylor is here to discuss her progress with her obesity treatment plan along with follow-up of her obesity related diagnoses. Tonya Taylor is on the Category 3 Plan and states she is following her eating plan approximately 96% of the time. Tonya Taylor states she is exercising on the elliptical 15 minutes 5 times per week.  Today's visit was #: 3 Starting weight: 237 lbs Starting date: 09/30/2019 Today's weight: 230 lbs Today's date: 11/02/2019 Total lbs lost to date: 7 Total lbs lost since last in-office visit: 1  Interim History: Tonya Taylor states that she has been eating the exercise calories that My [employer]itness Pal gives her in addition to her 1500 calories. She is eating popcorn and oatmeal cookies for her snacks.  Subjective:   Other hyperlipidemia. Tonya Taylor is on Zocor. No chest pain.  Lab Results  Component Value Date   CHOL 192 09/21/2019   HDL 55.40 09/21/2019   LDLCALC 118 (H) 09/21/2019   TRIG 94.0 09/21/2019   CHOLHDL 3 09/21/2019   Lab Results  Component Value Date   ALT 29 09/21/2019   AST 29 09/21/2019   ALKPHOS 58 09/21/2019   BILITOT 0.5 09/21/2019   The 10-year ASCVD risk score Tonya Bussing DC Jr., et al., 2013) is: 8.2%   Values used to calculate the score:     Age: 64 years     Sex: Female     Is Non-Hispanic African American: Yes     Diabetic: No     Tobacco smoker: No     Systolic Blood Pressure: 382 mmHg     Is BP treated: Yes     HDL Cholesterol: 55.4 mg/dL     Total Cholesterol: 192 mg/dL  Assessment/Plan:   Other hyperlipidemia. Cardiovascular risk and specific lipid/LDL goals reviewed.  We discussed several lifestyle modifications today and Tonya Taylor will continue to work on diet, exercise and weight loss efforts. Orders and follow up as documented in patient record. She will continue Zocor as directed.  Counseling Intensive lifestyle modifications are the first line treatment for this issue. . Dietary changes:  Increase soluble fiber. Decrease simple carbohydrates. . Exercise changes: Moderate to vigorous-intensity aerobic activity 150 minutes per week if tolerated. . Lipid-lowering medications: see documented in medical record.  Cobesity with serious comorbidity and body mass index (BMI) of 36.0 to 36.9 in adult, unspecified obesity type (lass 2 severe HCC).  Tonya Taylor is currently in the action stage of change. As such, her goal is to continue with weight loss efforts. She has agreed to keeping a food journal and adhering to recommended goals of 1500 calories and 95 grams of protein daily.   Exercise goals: For substantial health benefits, adults should do at least 150 minutes (2 hours and 30 minutes) a week of moderate-intensity, or 75 minutes (1 hour and 15 minutes) a week of vigorous-intensity aerobic physical activity, or an equivalent combination of moderate- and vigorous-intensity aerobic activity. Aerobic activity should be performed in episodes of at least 10 minutes, and preferably, it should be spread throughout the week.  Behavioral modification strategies: meal planning and cooking strategies and keeping healthy foods in the home.  Tonya Taylor has agreed to follow-up with our clinic in 2 weeks. She was informed of the importance of frequent follow-up visits to maximize her success with intensive lifestyle modifications for her multiple health conditions.   Objective:   Blood pressure 133/87, pulse 71, temperature 98.2 F (36.8 C), temperature source Oral, height 5' 7"  (1.702  m), weight 230 lb (104.3 kg), SpO2 98 %. Body mass index is 36.02 kg/m.  General: Cooperative, alert, well developed, in no acute distress. HEENT: Conjunctivae and lids unremarkable. Cardiovascular: Regular rhythm.  Lungs: Normal work of breathing. Neurologic: No focal deficits.   Lab Results  Component Value Date   CREATININE 0.96 09/21/2019   BUN 12 09/21/2019   NA 137 09/21/2019   K 4.1 09/21/2019   CL 101  09/21/2019   CO2 29 09/21/2019   Lab Results  Component Value Date   ALT 29 09/21/2019   AST 29 09/21/2019   ALKPHOS 58 09/21/2019   BILITOT 0.5 09/21/2019   Lab Results  Component Value Date   HGBA1C 5.6 09/30/2019   HGBA1C 5.6 03/19/2016   HGBA1C 5.5 03/16/2015   HGBA1C 5.6 03/01/2014   Lab Results  Component Value Date   INSULIN 21.5 09/30/2019   Lab Results  Component Value Date   TSH 3.19 09/21/2019   Lab Results  Component Value Date   CHOL 192 09/21/2019   HDL 55.40 09/21/2019   LDLCALC 118 (H) 09/21/2019   TRIG 94.0 09/21/2019   CHOLHDL 3 09/21/2019   Lab Results  Component Value Date   WBC 4.5 09/21/2019   HGB 14.9 09/21/2019   HCT 44.4 09/21/2019   MCV 88.7 09/21/2019   PLT 220.0 09/21/2019   Lab Results  Component Value Date   IRON 61 03/20/2017   Attestation Statements:   Reviewed by clinician on day of visit: allergies, medications, problem list, medical history, surgical history, family history, social history, and previous encounter notes.  Time spent on visit including pre-visit chart review and post-visit charting and care was 30 minutes.   Tonya Taylor, am acting as transcriptionist for Tonya Potash, PA-C   I have reviewed the above documentation for accuracy and completeness, and I agree with the above. Tonya Potash, PA-C

## 2019-11-19 ENCOUNTER — Encounter (INDEPENDENT_AMBULATORY_CARE_PROVIDER_SITE_OTHER): Payer: Self-pay | Admitting: Family Medicine

## 2019-11-19 ENCOUNTER — Other Ambulatory Visit: Payer: Self-pay

## 2019-11-19 ENCOUNTER — Ambulatory Visit (INDEPENDENT_AMBULATORY_CARE_PROVIDER_SITE_OTHER): Payer: BC Managed Care – PPO | Admitting: Family Medicine

## 2019-11-19 VITALS — BP 131/82 | HR 60 | Temp 98.1°F | Ht 67.0 in | Wt 228.0 lb

## 2019-11-19 DIAGNOSIS — I10 Essential (primary) hypertension: Secondary | ICD-10-CM

## 2019-11-19 DIAGNOSIS — E8881 Metabolic syndrome: Secondary | ICD-10-CM | POA: Diagnosis not present

## 2019-11-19 DIAGNOSIS — Z6835 Body mass index (BMI) 35.0-35.9, adult: Secondary | ICD-10-CM

## 2019-11-23 ENCOUNTER — Ambulatory Visit (INDEPENDENT_AMBULATORY_CARE_PROVIDER_SITE_OTHER): Payer: BC Managed Care – PPO | Admitting: *Deleted

## 2019-11-23 ENCOUNTER — Other Ambulatory Visit: Payer: Self-pay

## 2019-11-23 DIAGNOSIS — Z23 Encounter for immunization: Secondary | ICD-10-CM | POA: Diagnosis not present

## 2019-11-23 NOTE — Progress Notes (Signed)
Chief Complaint:   OBESITY Tonya Taylor is here to discuss her progress with her obesity treatment plan along with follow-up of her obesity related diagnoses. Tonya Taylor is on keeping a food journal and adhering to recommended goals of 1500 calories and 95+ g of protein daily and states she is following her eating plan approximately 95% of the time. Tonya Taylor states she is gardening and doing elliptical for 15 minutes per week.  Today's visit was #: 4 Starting weight: 237 lbs Starting date: 09/30/19 Today's weight: 228 lbs Today's date: 11/19/19 Total lbs lost to date: 9 lbs Total lbs lost since last in-office visit: 2 lbs  Interim History: She is still liking the journaling but feels getting 1500 calories and 95 g of protein in to be difficult. She got skinny pop popcorn and has enjoyed it. Reviewing food logs noticed she is very occasionally going over 1500 calories but always getting 90+ g of protein a day. Upcoming traveling to [place] and [employer].   Subjective:   1. Insulin resistance Tonya Taylor has a diagnosis of insulin resistance based on her elevated fasting insulin level >5. She continues to work on diet and exercise to decrease her risk of diabetes. She is not currently taking Metformin.   Lab Results  Component Value Date   INSULIN 21.5 09/30/2019   Lab Results  Component Value Date   HGBA1C 5.6 09/30/2019    2. Essential hypertension Cardiovascular ROS: no chest pain,pressure, headache, or dyspnea on exertion. Her blood pressure is currently controlled. She is on micardis-hctz.   BP Readings from Last 3 Encounters:  11/19/19 131/82  11/02/19 133/87  10/14/19 124/76   Lab Results  Component Value Date   CREATININE 0.96 09/21/2019   CREATININE 0.94 05/26/2018   CREATININE 0.80 07/05/2017    Assessment/Plan:   1. Insulin resistance Tonya Taylor will continue to work on weight loss, exercise, and decreasing simple carbohydrates to help decrease the risk of  diabetes. Tonya Taylor agreed to follow-up with [place] as directed to closely monitor her progress. We will repeat labs in 2 months.   2. Essential hypertension Tonya Taylor is working on healthy weight loss and exercise to improve blood pressure control. We will watch for signs of hypotension as she continues her lifestyle modifications. She will continue current medications with no change in dose.   3. Class 2 severe obesity with serious comorbidity and body mass index (BMI) of 35.0 to 35.9 in adult, unspecified obesity type (Tonya Taylor) Tonya Taylor is currently in the action stage of change. As such, her goal is to continue with weight loss efforts. She has agreed to keeping a food journal and adhering to recommended goals of 1500 calories and 95+ g of protein daily.   Exercise goals:Continue as above.   Behavioral modification strategies: increasing lean protein intake, meal planning and cooking strategies and keeping healthy foods in the home.  Tonya Taylor has agreed to follow-up with our clinic in 2-3 weeks. She was informed of the importance of frequent follow-up visits to maximize her success with intensive lifestyle modifications for her multiple health conditions.    Objective:   Blood pressure 131/82, pulse 60, temperature 98.1 F (36.7 C), temperature source Oral, height 5' 7"  (1.702 m), weight 228 lb (103.4 kg), SpO2 100 %. Body mass index is 35.71 kg/m.  General: Cooperative, alert, well developed, in no acute distress. HEENT: Conjunctivae and lids unremarkable. Cardiovascular: Regular rhythm.  Lungs: Normal work of breathing. Neurologic: No focal deficits.   Lab Results  Component Value  Date   CREATININE 0.96 09/21/2019   BUN 12 09/21/2019   NA 137 09/21/2019   K 4.1 09/21/2019   CL 101 09/21/2019   CO2 29 09/21/2019   Lab Results  Component Value Date   ALT 29 09/21/2019   AST 29 09/21/2019   ALKPHOS 58 09/21/2019   BILITOT 0.5 09/21/2019   Lab Results  Component Value Date    HGBA1C 5.6 09/30/2019   HGBA1C 5.6 03/19/2016   HGBA1C 5.5 03/16/2015   HGBA1C 5.6 03/01/2014   Lab Results  Component Value Date   INSULIN 21.5 09/30/2019   Lab Results  Component Value Date   TSH 3.19 09/21/2019   Lab Results  Component Value Date   CHOL 192 09/21/2019   HDL 55.40 09/21/2019   LDLCALC 118 (H) 09/21/2019   TRIG 94.0 09/21/2019   CHOLHDL 3 09/21/2019   Lab Results  Component Value Date   WBC 4.5 09/21/2019   HGB 14.9 09/21/2019   HCT 44.4 09/21/2019   MCV 88.7 09/21/2019   PLT 220.0 09/21/2019   Lab Results  Component Value Date   IRON 61 03/20/2017    Obesity Behavioral Intervention Documentation for Insurance:   Approximately 15 minutes were spent on the discussion below.  ASK: We discussed the diagnosis of obesity with Tonya Taylor today and Tonya Taylor agreed to give [place] permission to discuss obesity behavioral modification therapy today.  ASSESS: Tonya Taylor has the diagnosis of obesity and her BMI today is 35.7. Tonya Taylor is in the action stage of change.   ADVISE: Tonya Taylor was educated on the multiple health risks of obesity as well as the benefit of weight loss to improve her health. She was advised of the need for long term treatment and the importance of lifestyle modifications to improve her current health and to decrease her risk of future health problems.  AGREE: Multiple dietary modification options and treatment options were discussed and Tonya Taylor agreed to follow the recommendations documented in the above note.  ARRANGE: Tonya Taylor was educated on the importance of frequent visits to treat obesity as outlined per CMS and USPSTF guidelines and agreed to schedule her next follow up appointment today.  Attestation Statements:   Reviewed by clinician on day of visit: allergies, medications, problem list, medical history, surgical history, family history, social history, and previous encounter notes.  Time spent on visit including pre-visit  chart review and post-visit care and charting was 15 minutes.   I, Renee Ramus, am acting as transcriptionist for Ilene Qua, MD .  I have reviewed the above documentation for accuracy and completeness, and I agree with the above. - Jinny Blossom, MD

## 2019-12-10 ENCOUNTER — Ambulatory Visit (INDEPENDENT_AMBULATORY_CARE_PROVIDER_SITE_OTHER): Payer: BC Managed Care – PPO | Admitting: Family Medicine

## 2019-12-10 ENCOUNTER — Encounter (INDEPENDENT_AMBULATORY_CARE_PROVIDER_SITE_OTHER): Payer: Self-pay | Admitting: Family Medicine

## 2019-12-10 ENCOUNTER — Other Ambulatory Visit: Payer: Self-pay

## 2019-12-10 VITALS — BP 133/83 | HR 67 | Temp 98.1°F | Ht 67.0 in | Wt 227.0 lb

## 2019-12-10 DIAGNOSIS — Z6835 Body mass index (BMI) 35.0-35.9, adult: Secondary | ICD-10-CM

## 2019-12-10 DIAGNOSIS — I10 Essential (primary) hypertension: Secondary | ICD-10-CM

## 2019-12-10 DIAGNOSIS — E8881 Metabolic syndrome: Secondary | ICD-10-CM | POA: Diagnosis not present

## 2019-12-14 NOTE — Progress Notes (Signed)
Chief Complaint:   OBESITY Tonya Taylor is here to discuss her progress with her obesity treatment plan along with follow-up of her obesity related diagnoses. Tonya Taylor is on keeping a food journal and adhering to recommended goals of 1500 calories and 95+ grams of protein daily and states she is following her eating plan approximately 98% of the time. Tonya Taylor states she is doing yard work 1 time per week.  Today's visit was #: 5 Starting weight: 237 lbs Starting date: 09/30/2019 Today's weight: 227 lbs Today's date: 12/10/2019 Total lbs lost to date: 10 Total lbs lost since last in-office visit: 1  Interim History: Tonya Taylor finds breakfast and lunch are normally on the plan, and dinner is a struggle. She is tried of monotony of dinner. She needs some different recipes and ideas for dinner recipes. She is not hitting 1500 calories daily (averaging 1350-1400 calories), and easily hitting protein goal.  Subjective:   1. Essential hypertension Tonya Taylor's blood pressure is well controlled today on Micardis-hydrochlorothiazide and spironolactone. She denies chest pain, chest pressure, or headaches.  2. Insulin resistance Tonya Taylor is not on medications, and she notes occasional carbohydrate cravings.    Assessment/Plan:   1. Essential hypertension Tonya Taylor will continue her current medications, and will continue working on healthy weight loss and exercise to improve blood pressure control. We will watch for signs of hypotension as she continues her lifestyle modifications.  2. Insulin resistance Tonya Taylor will continue to work on weight loss, exercise, and decreasing simple carbohydrates to help decrease the risk of diabetes. We will repeat labs in 1 month. Tonya Taylor agreed to follow-up with [place] as directed to closely monitor her progress.  3. Class 2 severe obesity with serious comorbidity and body mass index (BMI) of 35.0 to 35.9 in adult, unspecified obesity type (Tonya Taylor) Tonya Taylor is  currently in the action stage of change. As such, her goal is to continue with weight loss efforts. She has agreed to keeping a food journal and adhering to recommended goals of 1450-1600 calories and 110+ grams of protein daily.   Exercise goals: All adults should avoid inactivity. Some physical activity is better than none, and adults who participate in any amount of physical activity gain some health benefits.  Behavioral modification strategies: increasing lean protein intake, increasing vegetables, meal planning and cooking strategies, keeping healthy foods in the home, planning for success and keeping a strict food journal.  Tonya Taylor has agreed to follow-up with our clinic in 2 weeks. She was informed of the importance of frequent follow-up visits to maximize her success with intensive lifestyle modifications for her multiple health conditions.   Objective:   Blood pressure 133/83, pulse 67, temperature 98.1 F (36.7 C), temperature source Oral, height 5' 7"  (1.702 m), weight 227 lb (103 kg), SpO2 98 %. Body mass index is 35.55 kg/m.  General: Cooperative, alert, well developed, in no acute distress. HEENT: Conjunctivae and lids unremarkable. Cardiovascular: Regular rhythm.  Lungs: Normal work of breathing. Neurologic: No focal deficits.   Lab Results  Component Value Date   CREATININE 0.96 09/21/2019   BUN 12 09/21/2019   NA 137 09/21/2019   K 4.1 09/21/2019   CL 101 09/21/2019   CO2 29 09/21/2019   Lab Results  Component Value Date   ALT 29 09/21/2019   AST 29 09/21/2019   ALKPHOS 58 09/21/2019   BILITOT 0.5 09/21/2019   Lab Results  Component Value Date   HGBA1C 5.6 09/30/2019   HGBA1C 5.6 03/19/2016   HGBA1C 5.5  03/16/2015   HGBA1C 5.6 03/01/2014   Lab Results  Component Value Date   INSULIN 21.5 09/30/2019   Lab Results  Component Value Date   TSH 3.19 09/21/2019   Lab Results  Component Value Date   CHOL 192 09/21/2019   HDL 55.40 09/21/2019    LDLCALC 118 (H) 09/21/2019   TRIG 94.0 09/21/2019   CHOLHDL 3 09/21/2019   Lab Results  Component Value Date   WBC 4.5 09/21/2019   HGB 14.9 09/21/2019   HCT 44.4 09/21/2019   MCV 88.7 09/21/2019   PLT 220.0 09/21/2019   Lab Results  Component Value Date   IRON 61 03/20/2017   Attestation Statements:   Reviewed by clinician on day of visit: allergies, medications, problem list, medical history, surgical history, family history, social history, and previous encounter notes.  Time spent on visit including pre-visit chart review and post-visit care and charting was 15 minutes.    I, Trixie Dredge, am acting as transcriptionist for Coralie Common, MD.  I have reviewed the above documentation for accuracy and completeness, and I agree with the above. - Jinny Blossom, MD

## 2019-12-24 ENCOUNTER — Ambulatory Visit (INDEPENDENT_AMBULATORY_CARE_PROVIDER_SITE_OTHER): Payer: BC Managed Care – PPO | Admitting: Family Medicine

## 2019-12-24 ENCOUNTER — Other Ambulatory Visit: Payer: Self-pay

## 2019-12-24 ENCOUNTER — Encounter (INDEPENDENT_AMBULATORY_CARE_PROVIDER_SITE_OTHER): Payer: Self-pay | Admitting: Family Medicine

## 2019-12-24 VITALS — BP 137/77 | HR 74 | Temp 98.0°F | Ht 67.0 in | Wt 229.0 lb

## 2019-12-24 DIAGNOSIS — E7849 Other hyperlipidemia: Secondary | ICD-10-CM

## 2019-12-24 DIAGNOSIS — I10 Essential (primary) hypertension: Secondary | ICD-10-CM | POA: Diagnosis not present

## 2019-12-24 DIAGNOSIS — Z6835 Body mass index (BMI) 35.0-35.9, adult: Secondary | ICD-10-CM | POA: Diagnosis not present

## 2019-12-28 NOTE — Progress Notes (Signed)
Chief Complaint:   OBESITY Tonya Taylor is here to discuss her progress with her obesity treatment plan along with follow-up of her obesity related diagnoses. Tonya Taylor is on keeping a food journal and adhering to recommended goals of 1450-1600 calories and 110+ grams of protein daily and states she is following her eating plan approximately 95% of the time. Tonya Taylor states she is walking all day at school.  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 She restarted eating Cheetos this week. She has been journaling up until this week. She is doing a sandwich and Cheetos for lunch. She hasn't journaled this week but otherwise she was averaging around 1550-1600 calories and 130 grams of protein per day.  Subjective:   1. Essential hypertension Tonya Taylor's blood pressure is well controlled today. She denies chest pain, chest pressure, or headache.  2. Other hyperlipidemia Tonya Taylor is on simvastatin, and she denies myalgias. Last LDL was 118, HDL 55, and triglycerides 94.  Assessment/Plan:   1. Essential hypertension Tonya Taylor will continue her current medications with no change in dose, and will continue working on healthy weight loss and exercise to improve blood pressure control. We will watch for signs of hypotension as she continues her lifestyle modifications.  2. Other hyperlipidemia Cardiovascular risk and specific lipid/LDL goals reviewed. We discussed several lifestyle modifications today and Tonya Taylor will continue to work on diet, exercise and weight loss efforts. We will follow up on labs in early September. Orders and follow up as documented in patient record.   Counseling Intensive lifestyle modifications  are the first line treatment for this issue. . Dietary changes: Increase soluble fiber. Decrease simple carbohydrates. . Exercise changes: Moderate to vigorous-intensity aerobic activity 150 minutes per week if tolerated. . Lipid-lowering medications: see documented in medical record.  3. Class 2 severe obesity with serious comorbidity and body mass index (BMI) of 35.0 to 35.9 in adult, unspecified obesity type (Tonya Taylor) Tonya Taylor is currently in the action stage of change. As such, her goal is to continue with weight loss efforts. She has agreed to keeping a food journal and adhering to recommended goals of 1450-1600 calories and 110+ grams of protein daily.   Exercise goals: As is.  Behavioral modification strategies: increasing lean protein intake, meal planning and cooking strategies, keeping healthy foods in the home and keeping a strict food journal.  Tonya Taylor has agreed to follow-up with our clinic in 3 weeks. She was informed of the importance of frequent follow-up visits to maximize her success with intensive lifestyle modifications for her multiple health conditions.   Objective:   Blood pressure 137/77, pulse 74, temperature 98 F (36.7 C), temperature source Oral, height 5' 7"  (1.702 m), weight 229 lb (103.9 kg), SpO2 96 %. Body mass index is 35.87 kg/m.  General: Cooperative, alert, well developed, in no acute distress. HEENT: Conjunctivae and lids unremarkable. Cardiovascular: Regular rhythm.  Lungs: Normal work of breathing. Neurologic: No focal deficits.   Lab Results  Component Value Date   CREATININE 0.96 09/21/2019   BUN 12 09/21/2019   NA 137 09/21/2019   K 4.1 09/21/2019   CL 101 09/21/2019   CO2 29 09/21/2019   Lab Results  Component Value Date   ALT 29 09/21/2019  AST 29 09/21/2019   ALKPHOS 58 09/21/2019   BILITOT 0.5 09/21/2019   Lab Results  Component Value Date   HGBA1C 5.6 09/30/2019   HGBA1C 5.6 03/19/2016   HGBA1C 5.5 03/16/2015   HGBA1C 5.6  03/01/2014   Lab Results  Component Value Date   INSULIN 21.5 09/30/2019   Lab Results  Component Value Date   TSH 3.19 09/21/2019   Lab Results  Component Value Date   CHOL 192 09/21/2019   HDL 55.40 09/21/2019   LDLCALC 118 (H) 09/21/2019   TRIG 94.0 09/21/2019   CHOLHDL 3 09/21/2019   Lab Results  Component Value Date   WBC 4.5 09/21/2019   HGB 14.9 09/21/2019   HCT 44.4 09/21/2019   MCV 88.7 09/21/2019   PLT 220.0 09/21/2019   Lab Results  Component Value Date   IRON 61 03/20/2017   Attestation Statements:   Reviewed by clinician on day of visit: allergies, medications, problem list, medical history, surgical history, family history, social history, and previous encounter notes.  Time spent on visit including pre-visit chart review and post-visit care and charting was 15 minutes.    I, Trixie Dredge, am acting as transcriptionist for Coralie Common, MD.  I have reviewed the above documentation for accuracy and completeness, and I agree with the above. - Jinny Blossom, MD

## 2020-01-13 ENCOUNTER — Other Ambulatory Visit: Payer: Self-pay

## 2020-01-13 ENCOUNTER — Encounter (INDEPENDENT_AMBULATORY_CARE_PROVIDER_SITE_OTHER): Payer: Self-pay | Admitting: Physician Assistant

## 2020-01-13 ENCOUNTER — Ambulatory Visit (INDEPENDENT_AMBULATORY_CARE_PROVIDER_SITE_OTHER): Payer: BC Managed Care – PPO | Admitting: Physician Assistant

## 2020-01-13 VITALS — BP 152/90 | HR 71 | Temp 98.1°F | Ht 67.0 in | Wt 227.0 lb

## 2020-01-13 DIAGNOSIS — I10 Essential (primary) hypertension: Secondary | ICD-10-CM

## 2020-01-13 DIAGNOSIS — E7849 Other hyperlipidemia: Secondary | ICD-10-CM

## 2020-01-13 DIAGNOSIS — Z6835 Body mass index (BMI) 35.0-35.9, adult: Secondary | ICD-10-CM

## 2020-01-17 NOTE — Progress Notes (Signed)
Chief Complaint:   OBESITY Tonya Taylor is here to discuss her progress with her obesity treatment plan along with follow-up of her obesity related diagnoses. Tonya Taylor is on keeping a food journal and adhering to recommended goals of 1450-1600 calories and 110+ grams of protein and states she is following her eating plan approximately 90-95% of the time. Tonya Taylor states she is walking at school.  Today's visit was #: 7 Starting weight: 237 lbs Starting date: 09/30/2019 Today's weight: 227 lbs Today's date: 01/13/2020 Total lbs lost to date: 10 lbs Total lbs lost since last in-office visit: 2 lbs  Interim History: Tonya Taylor says she is surprised with her weight loss today because she indulged in banana pudding.  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  She follows the plan for breakfast and lunch, but wants freedom with her dinner meal.  Subjective:   1. Other hyperlipidemia Rogelio has hyperlipidemia and has been trying to improve her cholesterol levels with intensive lifestyle modification including a low saturated fat diet, exercise and weight loss. She denies any chest pain, claudication or myalgias.  She is on Zocor and is tolerating it well.  Lab Results  Component Value Date   ALT 29 09/21/2019   AST 29 09/21/2019   ALKPHOS 58 09/21/2019   BILITOT 0.5 09/21/2019   Lab Results  Component Value Date   CHOL 192 09/21/2019   HDL 55.40 09/21/2019   LDLCALC 118 (H) 09/21/2019   TRIG 94.0 09/21/2019   CHOLHDL 3 09/21/2019   2. Essential hypertension Review: taking medications as instructed, no medication side effects noted, no chest pain on exertion, no dyspnea on exertion, no swelling of ankles.  She is on Aldactone and Micardis.  Blood pressure is up today due to stress.  She takes her medications at night.  She denies headache or chest pain.  BP Readings from Last 3 Encounters:  01/13/20 (!) 152/90  12/24/19 137/77  12/10/19 133/83   Assessment/Plan:   1.  Other hyperlipidemia Cardiovascular risk and specific lipid/LDL goals reviewed.  We discussed several lifestyle modifications today and Tonya Taylor will continue to work on diet, exercise and weight loss efforts. Orders and follow up as documented in patient record.  Continue medication and weight loss.   Counseling Intensive lifestyle modifications are the first line treatment for this issue. . Dietary changes: Increase soluble fiber. Decrease simple carbohydrates. . Exercise changes: Moderate to vigorous-intensity aerobic activity 150 minutes per week if tolerated. . Lipid-lowering medications: see documented in medical record.  2. Essential hypertension Tonya Taylor is working on healthy weight loss and exercise to improve blood pressure control. We will watch for signs of hypotension as she continues her lifestyle modifications.  Continue medications and weight loss.  3. Class 2 severe obesity with serious comorbidity and body mass index (BMI) of 35.0 to 35.9 in adult, unspecified obesity type (Tonya Taylor) Tonya Taylor is currently in the action stage of change. As such, her goal is to continue with weight loss efforts. She has agreed to the Category 3 Plan and keeping a food journal and adhering to recommended goals of 450-600 calories and 40 grams of protein at supper.   Exercise goals: For substantial health benefits, adults should do at least 150 minutes (2 hours and 30 minutes) a week of moderate-intensity, or 75 minutes (1 hour and 15 minutes) a week of vigorous-intensity aerobic physical activity, or an equivalent combination of moderate- and vigorous-intensity aerobic activity. Aerobic activity should be performed in episodes of at  least 10 minutes, and preferably, it should be spread throughout the week.  Behavioral modification strategies: meal planning and cooking strategies and keeping a strict food journal.  Tonya Taylor has agreed to follow-up with our clinic in 3 weeks. She was informed of the  importance of frequent follow-up visits to maximize her success with intensive lifestyle modifications for her multiple health conditions.   Objective:   Blood pressure (!) 152/90, pulse 71, temperature 98.1 F (36.7 C), temperature source Oral, height 5' 7"  (1.702 m), weight 227 lb (103 kg), SpO2 99 %. Body mass index is 35.55 kg/m.  General: Cooperative, alert, well developed, in no acute distress. HEENT: Conjunctivae and lids unremarkable. Cardiovascular: Regular rhythm.  Lungs: Normal work of breathing. Neurologic: No focal deficits.   Lab Results  Component Value Date   CREATININE 0.96 09/21/2019   BUN 12 09/21/2019   NA 137 09/21/2019   K 4.1 09/21/2019   CL 101 09/21/2019   CO2 29 09/21/2019   Lab Results  Component Value Date   ALT 29 09/21/2019   AST 29 09/21/2019   ALKPHOS 58 09/21/2019   BILITOT 0.5 09/21/2019   Lab Results  Component Value Date   HGBA1C 5.6 09/30/2019   HGBA1C 5.6 03/19/2016   HGBA1C 5.5 03/16/2015   HGBA1C 5.6 03/01/2014   Lab Results  Component Value Date   INSULIN 21.5 09/30/2019   Lab Results  Component Value Date   TSH 3.19 09/21/2019   Lab Results  Component Value Date   CHOL 192 09/21/2019   HDL 55.40 09/21/2019   LDLCALC 118 (H) 09/21/2019   TRIG 94.0 09/21/2019   CHOLHDL 3 09/21/2019   Lab Results  Component Value Date   WBC 4.5 09/21/2019   HGB 14.9 09/21/2019   HCT 44.4 09/21/2019   MCV 88.7 09/21/2019   PLT 220.0 09/21/2019   Lab Results  Component Value Date   IRON 61 03/20/2017   Attestation Statements:   Reviewed by clinician on day of visit: allergies, medications, problem list, medical history, surgical history, family history, social history, and previous encounter notes.  Time spent on visit including pre-visit chart review and post-visit care and charting was 30 minutes.   I, Water quality scientist, CMA, am acting as transcriptionist for Abby Potash, PA-C  I have reviewed the above documentation for  accuracy and completeness, and I agree with the above. Abby Potash, PA-C

## 2020-02-04 ENCOUNTER — Encounter (INDEPENDENT_AMBULATORY_CARE_PROVIDER_SITE_OTHER): Payer: Self-pay | Admitting: Physician Assistant

## 2020-02-04 ENCOUNTER — Ambulatory Visit (INDEPENDENT_AMBULATORY_CARE_PROVIDER_SITE_OTHER): Payer: BC Managed Care – PPO | Admitting: Physician Assistant

## 2020-02-04 ENCOUNTER — Other Ambulatory Visit: Payer: Self-pay

## 2020-02-04 VITALS — BP 122/74 | HR 80 | Temp 98.2°F | Ht 67.0 in | Wt 222.0 lb

## 2020-02-04 DIAGNOSIS — E559 Vitamin D deficiency, unspecified: Secondary | ICD-10-CM

## 2020-02-04 DIAGNOSIS — E669 Obesity, unspecified: Secondary | ICD-10-CM

## 2020-02-04 DIAGNOSIS — Z6834 Body mass index (BMI) 34.0-34.9, adult: Secondary | ICD-10-CM | POA: Diagnosis not present

## 2020-02-04 DIAGNOSIS — E8881 Metabolic syndrome: Secondary | ICD-10-CM

## 2020-02-05 ENCOUNTER — Ambulatory Visit: Payer: BC Managed Care – PPO

## 2020-02-08 NOTE — Progress Notes (Signed)
Chief Complaint:   OBESITY Tonya Taylor is here to discuss her progress with her obesity treatment plan along with follow-up of her obesity related diagnoses. Tonya Taylor is on the Category 3 Plan and keeping a food journal and adhering to recommended goals of 450-600 calories and 40 grams of protein at supper daily and states she is following her eating plan approximately 0% of the time. Tonya Taylor states she is walking at work.   Today's visit was #: 8 Starting weight: 237 lbs Starting date: 09/30/2019 Today's weight: 222 lbs Today's date: 02/04/2020 Total lbs lost to date: 15 Total lbs lost since last in-office visit: 5  Interim History: Tonya Taylor has not been journaling as she states "it's like another job". She does well with breakfast and lunch, and reports that she is now cooking and freezing food for dinners.  Subjective:   1. Insulin resistance Tonya Taylor is not on medications, and her hunger is controlled.  2. Vitamin D deficiency Tonya Taylor's last level is at goal. She is multivitamins currently.  Assessment/Plan:   1. Insulin resistance Tonya Taylor will continue her meal plan, and will continue to work on weight loss, exercise, and decreasing simple carbohydrates to help decrease the risk of diabetes. We will recheck insulin level next month. Tonya Taylor agreed to follow-up with [place] as directed to closely monitor her progress.  2. Vitamin D deficiency Low Vitamin D level contributes to fatigue and are associated with obesity, breast, and colon cancer. Tonya Taylor agreed to continue taking multivitamins and we will recheck Vit D level next month. She will follow-up for routine testing of Vitamin D, at least 2-3 times per year to avoid over-replacement.  3. Class 1 obesity with serious comorbidity and body mass index (BMI) of 34.0 to 34.9 in adult, unspecified obesity type Tonya Taylor is currently in the action stage of change. As such, her goal is to continue with weight loss efforts. She has  agreed to the Category 3 Plan and keeping a food journal and adhering to recommended goals of 450-600 calories and 40 grams of protein at supper daily.   Exercise goals: As is.  Behavioral modification strategies: meal planning and cooking strategies and keeping healthy foods in the home.  Tonya Taylor has agreed to follow-up with our clinic in 3 weeks. She was informed of the importance of frequent follow-up visits to maximize her success with intensive lifestyle modifications for her multiple health conditions.   Objective:   Blood pressure 122/74, pulse 80, temperature 98.2 F (36.8 C), height 5' 7"  (1.702 m), weight 222 lb (100.7 kg), SpO2 100 %. Body mass index is 34.77 kg/m.  General: Cooperative, alert, well developed, in no acute distress. HEENT: Conjunctivae and lids unremarkable. Cardiovascular: Regular rhythm.  Lungs: Normal work of breathing. Neurologic: No focal deficits.   Lab Results  Component Value Date   CREATININE 0.96 09/21/2019   BUN 12 09/21/2019   NA 137 09/21/2019   K 4.1 09/21/2019   CL 101 09/21/2019   CO2 29 09/21/2019   Lab Results  Component Value Date   ALT 29 09/21/2019   AST 29 09/21/2019   ALKPHOS 58 09/21/2019   BILITOT 0.5 09/21/2019   Lab Results  Component Value Date   HGBA1C 5.6 09/30/2019   HGBA1C 5.6 03/19/2016   HGBA1C 5.5 03/16/2015   HGBA1C 5.6 03/01/2014   Lab Results  Component Value Date   INSULIN 21.5 09/30/2019   Lab Results  Component Value Date   TSH 3.19 09/21/2019   Lab Results  Component Value Date   CHOL 192 09/21/2019   HDL 55.40 09/21/2019   LDLCALC 118 (H) 09/21/2019   TRIG 94.0 09/21/2019   CHOLHDL 3 09/21/2019   Lab Results  Component Value Date   WBC 4.5 09/21/2019   HGB 14.9 09/21/2019   HCT 44.4 09/21/2019   MCV 88.7 09/21/2019   PLT 220.0 09/21/2019   Lab Results  Component Value Date   IRON 61 03/20/2017   Attestation Statements:   Reviewed by clinician on day of visit: allergies,  medications, problem list, medical history, surgical history, family history, social history, and previous encounter notes.  Time spent on visit including pre-visit chart review and post-visit care and charting was 30 minutes.    Tonya Taylor, am acting as transcriptionist for [employer], PA-C.  I have reviewed the above documentation for accuracy and completeness, and I agree with the above. Tonya Potash, PA-C

## 2020-02-12 ENCOUNTER — Other Ambulatory Visit: Payer: Self-pay | Admitting: Family

## 2020-02-25 ENCOUNTER — Ambulatory Visit (INDEPENDENT_AMBULATORY_CARE_PROVIDER_SITE_OTHER): Payer: BC Managed Care – PPO | Admitting: Physician Assistant

## 2020-02-29 ENCOUNTER — Other Ambulatory Visit: Payer: Self-pay

## 2020-02-29 ENCOUNTER — Ambulatory Visit (INDEPENDENT_AMBULATORY_CARE_PROVIDER_SITE_OTHER): Payer: BC Managed Care – PPO | Admitting: *Deleted

## 2020-02-29 DIAGNOSIS — Z23 Encounter for immunization: Secondary | ICD-10-CM | POA: Diagnosis not present

## 2020-03-10 ENCOUNTER — Encounter (INDEPENDENT_AMBULATORY_CARE_PROVIDER_SITE_OTHER): Payer: Self-pay | Admitting: Physician Assistant

## 2020-03-10 ENCOUNTER — Other Ambulatory Visit: Payer: Self-pay

## 2020-03-10 ENCOUNTER — Ambulatory Visit (INDEPENDENT_AMBULATORY_CARE_PROVIDER_SITE_OTHER): Payer: BC Managed Care – PPO | Admitting: Physician Assistant

## 2020-03-10 VITALS — BP 135/90 | HR 70 | Temp 98.1°F | Ht 67.0 in | Wt 225.0 lb

## 2020-03-10 DIAGNOSIS — Z6835 Body mass index (BMI) 35.0-35.9, adult: Secondary | ICD-10-CM | POA: Diagnosis not present

## 2020-03-10 DIAGNOSIS — I10 Essential (primary) hypertension: Secondary | ICD-10-CM | POA: Diagnosis not present

## 2020-03-10 DIAGNOSIS — E785 Hyperlipidemia, unspecified: Secondary | ICD-10-CM

## 2020-03-14 NOTE — Progress Notes (Signed)
Chief Complaint:   OBESITY Tonya Taylor is here to discuss her progress with her obesity treatment plan along with follow-up of her obesity related diagnoses. Tonya Taylor is on the Category 3 Plan and states she is following her eating plan approximately 95% of the time. Tonya Taylor states she is walking 7 hours 5 times per week.  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 She is snacking on a lot of popcorn. She is not weighing her meat at night.  Subjective:   Essential hypertension with hyperlipidemia. Tonya Taylor is on Micardis and spironolactone. No  chest pain or headache. She is followed by her PCP.  BP Readings from Last 3 Encounters:  03/10/20 135/90  02/04/20 122/74  01/13/20 (!) 152/90   Lab Results  Component Value Date   CREATININE 0.96 09/21/2019   CREATININE 0.94 05/26/2018   CREATININE 0.80 07/05/2017   Hyperlipidemia, unspecified hyperlipidemia type. Tonya Taylor is on Zocor. No chest pain or myalgias. Last LDL was not at goal.   Lab Results  Component Value Date   CHOL 192 09/21/2019   HDL 55.40 09/21/2019   LDLCALC 118 (H) 09/21/2019   TRIG 94.0 09/21/2019   CHOLHDL 3 09/21/2019   Lab Results  Component Value Date   ALT 29 09/21/2019   AST 29 09/21/2019   ALKPHOS 58 09/21/2019   BILITOT 0.5 09/21/2019   The 10-year ASCVD risk score Tonya Taylor., et al., 2013) is: 9%   Values used to calculate the score:     Age: 64 years     Sex: Female     Is Non-Hispanic African American: Yes     Diabetic: No     Tobacco smoker: No     Systolic Blood Pressure: 782 mmHg     Is BP treated: Yes     HDL Cholesterol: 55.4 mg/dL     Total Cholesterol: 192 mg/dL  Assessment/Plan:    Essential hypertension with hyperlipidemia. Tonya Taylor is working on healthy weight loss and exercise to improve blood pressure control. We will watch for signs of hypotension as she continues her lifestyle modifications. Will monitor blood pressure at each visit. She will continue follow-up with her PCP as scheduled.  Hyperlipidemia, unspecified hyperlipidemia type. Cardiovascular risk and specific lipid/LDL goals reviewed.  We discussed several lifestyle modifications today and Tonya Taylor will continue to work on diet, exercise and weight loss efforts. Orders and follow up as documented in patient record. She will continue her medication as directed.   Counseling Intensive lifestyle modifications are the first line treatment for this issue.  Dietary changes: Increase soluble fiber. Decrease simple carbohydrates.  Exercise changes: Moderate to vigorous-intensity aerobic activity 150 minutes per week if tolerated.  Lipid-lowering medications: see documented in medical record.  Class 2 severe obesity with serious comorbidity and body mass index (BMI) of 35.0 to 35.9 in adult, unspecified obesity type ([name]).  Tonya Taylor is currently in the action stage of change. As such, her goal is to continue with weight loss efforts. She has agreed to the Category 3 Plan.   Exercise goals: For substantial health benefits, adults should do at least 150 minutes (2 hours and 30 minutes) a week of moderate-intensity, or 75 minutes (1  hour and 15 minutes) a week of vigorous-intensity aerobic physical activity, or an equivalent combination of moderate- and vigorous-intensity aerobic activity. Aerobic activity should be performed in episodes of at least 10 minutes, and preferably, it should be spread throughout the week.  Behavioral modification strategies: meal planning and cooking strategies, holiday eating strategies  and planning for success.  Tonya Taylor has agreed to follow-up with our clinic in 3-4 weeks. She was  informed of the importance of frequent follow-up visits to maximize her success with intensive lifestyle modifications for her multiple health conditions.   Objective:   Blood pressure 135/90, pulse 70, temperature 98.1 F (36.7 C), height 5\' 7"  (1.702 m), weight 225 lb (102.1 kg), SpO2 100 %. Body mass index is 35.24 kg/m.  General: Cooperative, alert, well developed, in no acute distress. HEENT: Conjunctivae and lids unremarkable. Cardiovascular: Regular rhythm.  Lungs: Normal work of breathing. Neurologic: No focal deficits.   Lab Results  Component Value Date   CREATININE 0.96 09/21/2019   BUN 12 09/21/2019   NA 137 09/21/2019   K 4.1 09/21/2019   CL 101 09/21/2019   CO2 29 09/21/2019   Lab Results  Component Value Date   ALT 29 09/21/2019   AST 29 09/21/2019   ALKPHOS 58 09/21/2019   BILITOT 0.5 09/21/2019   Lab Results  Component Value Date   HGBA1C 5.6 09/30/2019   HGBA1C 5.6 03/19/2016   HGBA1C 5.5 03/16/2015   HGBA1C 5.6 03/01/2014   Lab Results  Component Value Date   INSULIN 21.5 09/30/2019   Lab Results  Component Value Date   TSH 3.19 09/21/2019   Lab Results  Component Value Date   CHOL 192 09/21/2019   HDL 55.40 09/21/2019   LDLCALC 118 (H) 09/21/2019   TRIG 94.0 09/21/2019   CHOLHDL 3 09/21/2019   Lab Results  Component Value Date   WBC 4.5 09/21/2019   HGB 14.9 09/21/2019   HCT 44.4 09/21/2019   MCV 88.7 09/21/2019   PLT 220.0 09/21/2019   Lab Results  Component Value Date   IRON 61 03/20/2017   Attestation Statements:   Reviewed by clinician on day of visit: allergies, medications, problem list, medical history, surgical history, family history, social history, and previous encounter notes.  Time spent on visit including pre-visit chart review and post-visit charting and care was 30 minutes.   IMichaelene Song, am acting as transcriptionist for Abby Potash, PA-C   I have reviewed the above documentation for accuracy and  completeness, and I agree with the above. Abby Potash, PA-C

## 2020-04-04 ENCOUNTER — Encounter (INDEPENDENT_AMBULATORY_CARE_PROVIDER_SITE_OTHER): Payer: Self-pay

## 2020-04-05 ENCOUNTER — Ambulatory Visit (INDEPENDENT_AMBULATORY_CARE_PROVIDER_SITE_OTHER): Payer: BC Managed Care – PPO | Admitting: Physician Assistant

## 2020-04-05 ENCOUNTER — Other Ambulatory Visit: Payer: Self-pay

## 2020-04-05 ENCOUNTER — Encounter (INDEPENDENT_AMBULATORY_CARE_PROVIDER_SITE_OTHER): Payer: Self-pay | Admitting: Physician Assistant

## 2020-04-05 VITALS — BP 137/84 | HR 70 | Temp 98.1°F | Ht 67.0 in | Wt 226.0 lb

## 2020-04-05 DIAGNOSIS — E559 Vitamin D deficiency, unspecified: Secondary | ICD-10-CM | POA: Diagnosis not present

## 2020-04-05 DIAGNOSIS — Z6834 Body mass index (BMI) 34.0-34.9, adult: Secondary | ICD-10-CM

## 2020-04-05 DIAGNOSIS — E8881 Metabolic syndrome: Secondary | ICD-10-CM

## 2020-04-05 DIAGNOSIS — E669 Obesity, unspecified: Secondary | ICD-10-CM

## 2020-04-06 NOTE — Progress Notes (Signed)
Chief Complaint:   OBESITY Tonya Taylor is here to discuss her progress with her obesity treatment plan along with follow-up of her obesity related diagnoses. Tonya Taylor is on the Category 3 Plan and states she is following her eating plan approximately 90% of the time. Tonya Taylor states she is walking 30 minutes 7 times per week.  Today's visit was #: 10 Starting weight: 237 lbs Starting date: 09/30/2019 Today's weight: 226 lbs Today's date: 04/05/2020 Total lbs lost to date: 11 Total lbs lost since last in-office visit: 0  Interim History: Tonya Taylor states that she got off track "eating junk" over the holiday. She indulged in cheese puffs and chocolate. She was mindful of choices during meals and on other days.  Subjective:   Insulin resistance. Tonya Taylor has a diagnosis of insulin resistance based on her elevated fasting insulin level >5. She continues to work on diet and exercise to decrease her risk of diabetes. Last insulin goal was not at goal. Tonya Taylor denies polyphagia, but reports cravings.  Lab Results  Component Value Date   INSULIN 21.5 09/30/2019   Lab Results  Component Value Date   HGBA1C 5.6 09/30/2019   Vitamin D deficiency. Tonya Taylor is on Vitamin D daily. She endorses fatigue.   Ref. Range 09/21/2019 16:27  VITD Latest Ref Range: 30.00 - 100.00 ng/mL 55.07   Assessment/Plan:   Insulin resistance. Tonya Taylor will continue to work on weight loss, exercise, and decreasing simple carbohydrates to help decrease the risk of diabetes. Tonya Taylor agreed to follow-up with [place] as directed to closely monitor her progress.  Vitamin D deficiency. Low Vitamin D level contributes to fatigue and are associated with obesity, breast, and colon cancer. She agrees to continue to take Vitamin D as directed and will follow-up for routine testing of Vitamin D next month.  Class 1 obesity with serious comorbidity and body mass index (BMI) of 34.0 to 34.9 in adult, unspecified  obesity type.  Tonya Taylor is currently in the action stage of change. As such, her goal is to continue with weight loss efforts. She has agreed to the Category 3 Plan.   Exercise goals: For substantial health benefits, adults should do at least 150 minutes (2 hours and 30 minutes) a week of moderate-intensity, or 75 minutes (1 hour and 15 minutes) a week of vigorous-intensity aerobic physical activity, or an equivalent combination of moderate- and vigorous-intensity aerobic activity. Aerobic activity should be performed in episodes of at least 10 minutes, and preferably, it should be spread throughout the week.  Behavioral modification strategies: meal planning and cooking strategies and better snacking choices.  Tonya Taylor has agreed to follow-up with our clinic in 3 weeks. She was informed of the importance of frequent follow-up visits to maximize her success with intensive lifestyle modifications for her multiple health conditions.   Objective:   Blood pressure 137/84, pulse 70, temperature 98.1 F (36.7 C), height 5' 7"  (1.702 m), weight 226 lb (102.5 kg), SpO2 97 %. Body mass index is 35.4 kg/m.  General: Cooperative, alert, well developed, in no acute distress. HEENT: Conjunctivae and lids unremarkable. Cardiovascular: Regular rhythm.  Lungs: Normal work of breathing. Neurologic: No focal deficits.   Lab Results  Component Value Date   CREATININE 0.96 09/21/2019   BUN 12 09/21/2019   NA 137 09/21/2019   K 4.1 09/21/2019   CL 101 09/21/2019   CO2 29 09/21/2019   Lab Results  Component Value Date   ALT 29 09/21/2019   AST 29 09/21/2019  ALKPHOS 58 09/21/2019   BILITOT 0.5 09/21/2019   Lab Results  Component Value Date   HGBA1C 5.6 09/30/2019   HGBA1C 5.6 03/19/2016   HGBA1C 5.5 03/16/2015   HGBA1C 5.6 03/01/2014   Lab Results  Component Value Date   INSULIN 21.5 09/30/2019   Lab Results  Component Value Date   TSH 3.19 09/21/2019   Lab Results  Component Value  Date   CHOL 192 09/21/2019   HDL 55.40 09/21/2019   LDLCALC 118 (H) 09/21/2019   TRIG 94.0 09/21/2019   CHOLHDL 3 09/21/2019   Lab Results  Component Value Date   WBC 4.5 09/21/2019   HGB 14.9 09/21/2019   HCT 44.4 09/21/2019   MCV 88.7 09/21/2019   PLT 220.0 09/21/2019   Lab Results  Component Value Date   IRON 61 03/20/2017   Attestation Statements:   Reviewed by clinician on day of visit: allergies, medications, problem list, medical history, surgical history, family history, social history, and previous encounter notes.  Time spent on visit including pre-visit chart review and post-visit charting and care was 30 minutes.   Tonya Taylor, am acting as transcriptionist for Tonya Potash, PA-C   I have reviewed the above documentation for accuracy and completeness, and I agree with the above. Tonya Potash, PA-C

## 2020-04-25 ENCOUNTER — Ambulatory Visit (INDEPENDENT_AMBULATORY_CARE_PROVIDER_SITE_OTHER): Payer: BC Managed Care – PPO | Admitting: Physician Assistant

## 2020-04-25 ENCOUNTER — Encounter (INDEPENDENT_AMBULATORY_CARE_PROVIDER_SITE_OTHER): Payer: Self-pay | Admitting: Physician Assistant

## 2020-04-25 ENCOUNTER — Other Ambulatory Visit: Payer: Self-pay

## 2020-04-25 VITALS — BP 137/80 | HR 62 | Temp 98.5°F | Ht 67.0 in | Wt 228.0 lb

## 2020-04-25 DIAGNOSIS — I10 Essential (primary) hypertension: Secondary | ICD-10-CM | POA: Diagnosis not present

## 2020-04-25 DIAGNOSIS — Z6835 Body mass index (BMI) 35.0-35.9, adult: Secondary | ICD-10-CM

## 2020-04-25 DIAGNOSIS — E7849 Other hyperlipidemia: Secondary | ICD-10-CM | POA: Diagnosis not present

## 2020-04-25 NOTE — Progress Notes (Signed)
Chief Complaint:   OBESITY Tonya Taylor is here to discuss her progress with her obesity treatment plan along with follow-up of her obesity related diagnoses. Tonya Taylor is on the Category 3 Plan and states she is following her eating plan approximately 90% of the time. Tonya Taylor states she is walking 30-35 minutes 7 times per week.  Today's visit was #: 11 Starting weight: 237 lbs Starting date: 09/30/2019 Today's weight: 228 lbs Today's date: 04/25/2020 Total lbs lost to date: 9 Total lbs lost since last in-office visit: 0  Interim History: Tonya Taylor reports that she thinks she has been over snacking at night on chocolate. She reports eating all of the food on plan.  Subjective:   Essential hypertension. Tonya Taylor is on Micardis and aldactone. Blood pressure is controlled. No  chest pain or headache. PCP is managing.  BP Readings from Last 3 Encounters:  04/25/20 137/80  04/05/20 137/84  03/10/20 135/90   Lab Results  Component Value Date   CREATININE 0.96 09/21/2019   CREATININE 0.94 05/26/2018   CREATININE 0.80 07/05/2017   Other hyperlipidemia. Tonya Taylor is on Zocor. No chest pain or muscle aches. She is followed by her PCP.   Lab Results  Component Value Date   CHOL 192 09/21/2019   HDL 55.40 09/21/2019   LDLCALC 118 (H) 09/21/2019   TRIG 94.0 09/21/2019   CHOLHDL 3 09/21/2019   Lab Results  Component Value Date   ALT 29 09/21/2019   AST 29 09/21/2019   ALKPHOS 58 09/21/2019   BILITOT 0.5 09/21/2019   The 10-year ASCVD risk score [name] DC Jr., et al., 2013) is: 9.3%   Values used to calculate the score:     Age: 64 years     Sex: Female     Is Non-Hispanic African American: Yes     Diabetic: No     Tobacco smoker: No     Systolic Blood Pressure: 150 mmHg     Is BP treated: Yes     HDL Cholesterol: 55.4 mg/dL     Total Cholesterol: 192 mg/dL  Assessment/Plan:   Essential hypertension. Tonya Taylor is working on healthy weight loss and exercise to  improve blood pressure control. We will watch for signs of hypotension as she continues her lifestyle modifications. She will continue her medications as directed and follow-up with PCP for management as scheduled.  Other hyperlipidemia. Cardiovascular risk and specific lipid/LDL goals reviewed.  We discussed several lifestyle modifications today and Tonya Taylor will continue to work on diet, exercise and weight loss efforts. Orders and follow up as documented in patient record. She will continue her medication as directed and will follow-up with her PCP as scheduled.  Counseling Intensive lifestyle modifications are the first line treatment for this issue. . Dietary changes: Increase soluble fiber. Decrease simple carbohydrates. . Exercise changes: Moderate to vigorous-intensity aerobic activity 150 minutes per week if tolerated.  . Lipid-lowering medications: see documented in medical record.  Class 2 severe obesity with serious comorbidity and body mass index (BMI) of 35.0 to 35.9 in adult, unspecified obesity type (Tonya Taylor).  Tonya Taylor is currently in the action stage of change. As such, her goal is to continue with weight loss efforts. She has agreed to the Category 3 Plan.   Exercise goals: For substantial health benefits, adults should do at least 150 minutes (2 hours and 30 minutes) a week of moderate-intensity, or 75 minutes (1 hour and 15 minutes) a week of vigorous-intensity aerobic physical activity, or an equivalent  combination of moderate- and vigorous-intensity aerobic activity. Aerobic activity should be performed in episodes of at least 10 minutes, and preferably, it should be spread throughout the week.  Behavioral modification strategies: increasing lean protein intake and increasing water intake.  Tonya Taylor has agreed to follow-up with our clinic in 3 weeks. She was informed of the importance of frequent follow-up visits to maximize her success with intensive lifestyle modifications for  her multiple health conditions.   Objective:   Blood pressure 137/80, pulse 62, temperature 98.5 F (36.9 C), height 5' 7"  (1.702 m), weight 228 lb (103.4 kg), SpO2 100 %. Body mass index is 35.71 kg/m.  General: Cooperative, alert, well developed, in no acute distress. HEENT: Conjunctivae and lids unremarkable. Cardiovascular: Regular rhythm.  Lungs: Normal work of breathing. Neurologic: No focal deficits.   Lab Results  Component Value Date   CREATININE 0.96 09/21/2019   BUN 12 09/21/2019   NA 137 09/21/2019   K 4.1 09/21/2019   CL 101 09/21/2019   CO2 29 09/21/2019   Lab Results  Component Value Date   ALT 29 09/21/2019   AST 29 09/21/2019   ALKPHOS 58 09/21/2019   BILITOT 0.5 09/21/2019   Lab Results  Component Value Date   HGBA1C 5.6 09/30/2019   HGBA1C 5.6 03/19/2016   HGBA1C 5.5 03/16/2015   HGBA1C 5.6 03/01/2014   Lab Results  Component Value Date   INSULIN 21.5 09/30/2019   Lab Results  Component Value Date   TSH 3.19 09/21/2019   Lab Results  Component Value Date   CHOL 192 09/21/2019   HDL 55.40 09/21/2019   LDLCALC 118 (H) 09/21/2019   TRIG 94.0 09/21/2019   CHOLHDL 3 09/21/2019   Lab Results  Component Value Date   WBC 4.5 09/21/2019   HGB 14.9 09/21/2019   HCT 44.4 09/21/2019   MCV 88.7 09/21/2019   PLT 220.0 09/21/2019   Lab Results  Component Value Date   IRON 61 03/20/2017   Attestation Statements:   Reviewed by clinician on day of visit: allergies, medications, problem list, medical history, surgical history, family history, social history, and previous encounter notes.  Time spent on visit including pre-visit chart review and post-visit charting and care was 31 minutes.   IMichaelene Song, am acting as transcriptionist for Abby Potash, PA-C   I have reviewed the above documentation for accuracy and completeness, and I agree with the above. Abby Potash, PA-C

## 2020-05-17 ENCOUNTER — Encounter (INDEPENDENT_AMBULATORY_CARE_PROVIDER_SITE_OTHER): Payer: Self-pay | Admitting: Physician Assistant

## 2020-05-17 ENCOUNTER — Other Ambulatory Visit: Payer: Self-pay

## 2020-05-17 ENCOUNTER — Ambulatory Visit (INDEPENDENT_AMBULATORY_CARE_PROVIDER_SITE_OTHER): Payer: BC Managed Care – PPO | Admitting: Physician Assistant

## 2020-05-17 VITALS — BP 140/69 | HR 75 | Temp 97.7°F | Ht 67.0 in | Wt 227.0 lb

## 2020-05-17 DIAGNOSIS — Z6835 Body mass index (BMI) 35.0-35.9, adult: Secondary | ICD-10-CM | POA: Diagnosis not present

## 2020-05-17 DIAGNOSIS — E559 Vitamin D deficiency, unspecified: Secondary | ICD-10-CM | POA: Diagnosis not present

## 2020-05-17 DIAGNOSIS — K7581 Nonalcoholic steatohepatitis (NASH): Secondary | ICD-10-CM | POA: Diagnosis not present

## 2020-05-19 NOTE — Progress Notes (Signed)
Chief Complaint:   OBESITY Tonya Taylor is here to discuss her progress with her obesity treatment plan along with follow-up of her obesity related diagnoses. Tonya Taylor is on the Category 3 Plan and states she is following her eating plan approximately 95% of the time. Tonya Taylor states she is walking for 30 minutes 5 times per week.  Today's visit was #: 12 Starting weight: 237 lbs Starting date: 09/30/2019 Today's weight: 227 lbs Today's date: 05/17/2020 Total lbs lost to date: 10 Total lbs lost since last in-office visit: 1  Interim History: Tonya Taylor reports that she is drinking a lot of water and feeling much better. She states that breakfast and lunch are on the plan, but dinner is a struggle.  Subjective:   1. Vitamin D deficiency Tonya Taylor is on Vit D, and she denies nausea vomiting, or muscle weakness.  2. NASH (nonalcoholic steatohepatitis) Tonya Taylor's last liver enzymes were within normal range. She has no recent ultrasound in system. She denies abdominal pain.  Assessment/Plan:   1. Vitamin D deficiency Low Vitamin D level contributes to fatigue and are associated with obesity, breast, and colon cancer. Tonya Taylor agreed to continue taking Vitamin D and will follow-up for routine testing of Vitamin D, at least 2-3 times per year to avoid over-replacement.  2. NASH (nonalcoholic steatohepatitis) We discussed the likely diagnosis of non-alcoholic fatty liver disease today and how this condition is obesity related. Tonya Taylor was educated the importance of weight loss. Tonya Taylor agreed to continue with her weight loss efforts with healthier diet and exercise as an essential part of her treatment plan.  3. Class 2 severe obesity with serious comorbidity and body mass index (BMI) of 35.0 to 35.9 in adult, unspecified obesity type (Tonya Taylor) Tonya Taylor is currently in the action stage of change. As such, her goal is to continue with weight loss efforts. She has agreed to the Category 3 Plan.    Exercise goals: As is.  Behavioral modification strategies: increasing lean protein intake and decreasing simple carbohydrates.  Tonya Taylor has agreed to follow-up with our clinic in 3 weeks. She was informed of the importance of frequent follow-up visits to maximize her success with intensive lifestyle modifications for her multiple health conditions.   Objective:   Blood pressure 140/69, pulse 75, temperature 97.7 F (36.5 C), height 5' 7"  (1.702 m), weight 227 lb (103 kg), SpO2 98 %. Body mass index is 35.55 kg/m.  General: Cooperative, alert, well developed, in no acute distress. HEENT: Conjunctivae and lids unremarkable. Cardiovascular: Regular rhythm.  Lungs: Normal work of breathing. Neurologic: No focal deficits.   Lab Results  Component Value Date   CREATININE 0.96 09/21/2019   BUN 12 09/21/2019   NA 137 09/21/2019   K 4.1 09/21/2019   CL 101 09/21/2019   CO2 29 09/21/2019   Lab Results  Component Value Date   ALT 29 09/21/2019   AST 29 09/21/2019   ALKPHOS 58 09/21/2019   BILITOT 0.5 09/21/2019   Lab Results  Component Value Date   HGBA1C 5.6 09/30/2019   HGBA1C 5.6 03/19/2016   HGBA1C 5.5 03/16/2015   HGBA1C 5.6 03/01/2014   Lab Results  Component Value Date   INSULIN 21.5 09/30/2019   Lab Results  Component Value Date   TSH 3.19 09/21/2019   Lab Results  Component Value Date   CHOL 192 09/21/2019   HDL 55.40 09/21/2019   LDLCALC 118 (H) 09/21/2019   TRIG 94.0 09/21/2019   CHOLHDL 3 09/21/2019   Lab Results  Component Value Date   WBC 4.5 09/21/2019   HGB 14.9 09/21/2019   HCT 44.4 09/21/2019   MCV 88.7 09/21/2019   PLT 220.0 09/21/2019   Lab Results  Component Value Date   IRON 61 03/20/2017   Attestation Statements:   Reviewed by clinician on day of visit: allergies, medications, problem list, medical history, surgical history, family history, social history, and previous encounter notes.  Time spent on visit including pre-visit  chart review and post-visit care and charting was 30 minutes.    Wilhemena Durie, am acting as transcriptionist for [employer], PA-C.  I have reviewed the above documentation for accuracy and completeness, and I agree with the above. Abby Potash, PA-C

## 2020-06-06 ENCOUNTER — Ambulatory Visit (INDEPENDENT_AMBULATORY_CARE_PROVIDER_SITE_OTHER): Payer: BC Managed Care – PPO | Admitting: Physician Assistant

## 2020-06-06 ENCOUNTER — Encounter (INDEPENDENT_AMBULATORY_CARE_PROVIDER_SITE_OTHER): Payer: Self-pay | Admitting: Physician Assistant

## 2020-06-06 ENCOUNTER — Other Ambulatory Visit: Payer: Self-pay

## 2020-06-06 VITALS — BP 131/82 | HR 73 | Temp 97.4°F | Ht 67.0 in | Wt 228.0 lb

## 2020-06-06 DIAGNOSIS — Z6835 Body mass index (BMI) 35.0-35.9, adult: Secondary | ICD-10-CM

## 2020-06-06 DIAGNOSIS — E66812 Obesity, class 2: Secondary | ICD-10-CM

## 2020-06-06 DIAGNOSIS — E8881 Metabolic syndrome: Secondary | ICD-10-CM

## 2020-06-06 DIAGNOSIS — E88819 Insulin resistance, unspecified: Secondary | ICD-10-CM

## 2020-06-06 DIAGNOSIS — E7849 Other hyperlipidemia: Secondary | ICD-10-CM | POA: Diagnosis not present

## 2020-06-07 NOTE — Progress Notes (Signed)
Chief Complaint:   OBESITY Tonya Taylor is here to discuss her progress with her obesity treatment plan along with follow-up of her obesity related diagnoses. Tonya Taylor is on the Category 3 Plan and states she is following her eating plan approximately 90-95% of the time. Tonya Taylor states she is walking for 30 minutes 5 times per week.  Today's visit was #: 48 Starting weight: 237 lbs Starting date: 09/30/2019 Today's weight: 228 lbs Today's date: 06/06/2020 Total lbs lost to date: 9 Total lbs lost since last in-office visit: 0  Interim History: Tonya Taylor reports that her water intake has dropped since going back to work. Dinner continues to be a struggle due to her being tired when she gets home from work, and then she orders out. She is overeating her snack calories.  Subjective:   1. Insulin resistance Tonya Taylor is over-snacking but she denies polyphagia. She is asking questions about medications that may decrease her snacking.  2. Other hyperlipidemia Tonya Taylor's last LDL was not at goal. She is on Zocor, and she is tolerating it well. She is followed by her primary care provider.  Assessment/Plan:   1. Insulin resistance Tonya Taylor will continue to work on weight loss, exercise, and decreasing simple carbohydrates to help decrease the risk of diabetes. Tonya Taylor declined medications, and she will be more diligent about counting snack calories. She agreed to follow-up with [place] as directed to closely monitor her progress.  2. Other hyperlipidemia Cardiovascular risk and specific lipid/LDL goals reviewed. We discussed several lifestyle modifications today. Tonya Taylor will continue to follow up with her primary care provider, and will continue to work on diet, exercise and weight loss efforts. Orders and follow up as documented in patient record.   Counseling Intensive lifestyle modifications are the first line treatment for this issue. . Dietary changes: Increase soluble fiber. Decrease  simple carbohydrates. . Exercise changes: Moderate to vigorous-intensity aerobic activity 150 minutes per week if tolerated. . Lipid-lowering medications: see documented in medical record.  3. Class 2 severe obesity with serious comorbidity and body mass index (BMI) of 35.0 to 35.9 in adult, unspecified obesity type ([place]) Tonya Taylor is currently in the action stage of change. As such, her goal is to continue with weight loss efforts. She has agreed to the Category 3 Plan.   Exercise goals: As is.  Behavioral modification strategies: decreasing simple carbohydrates and decreasing eating out.  Tonya Taylor has agreed to follow-up with our clinic in 3 weeks. She was informed of the importance of frequent follow-up visits to maximize her success with intensive lifestyle modifications for her multiple health conditions.   Objective:   Blood pressure 131/82, pulse 73, temperature (!) 97.4 F (36.3 C), height 5\' 7"  (1.702 m), weight 228 lb (103.4 kg), SpO2 100 %. Body mass index is 35.71 kg/m.  General: Cooperative, alert, well developed, in no acute distress. HEENT: Conjunctivae and lids unremarkable. Cardiovascular: Regular rhythm.  Lungs: Normal work of breathing. Neurologic: No focal deficits.   Lab Results  Component Value Date   CREATININE 0.96 09/21/2019   BUN 12 09/21/2019   NA 137 09/21/2019   K 4.1 09/21/2019   CL 101 09/21/2019   CO2 29 09/21/2019   Lab Results  Component Value Date   ALT 29 09/21/2019   AST 29 09/21/2019   ALKPHOS 58 09/21/2019   BILITOT 0.5 09/21/2019   Lab Results  Component Value Date   HGBA1C 5.6 09/30/2019   HGBA1C 5.6 03/19/2016   HGBA1C 5.5 03/16/2015   HGBA1C 5.6  03/01/2014   Lab Results  Component Value Date   INSULIN 21.5 09/30/2019   Lab Results  Component Value Date   TSH 3.19 09/21/2019   Lab Results  Component Value Date   CHOL 192 09/21/2019   HDL 55.40 09/21/2019   LDLCALC 118 (H) 09/21/2019   TRIG 94.0 09/21/2019    CHOLHDL 3 09/21/2019   Lab Results  Component Value Date   WBC 4.5 09/21/2019   HGB 14.9 09/21/2019   HCT 44.4 09/21/2019   MCV 88.7 09/21/2019   PLT 220.0 09/21/2019   Lab Results  Component Value Date   IRON 61 03/20/2017   Attestation Statements:   Reviewed by clinician on day of visit: allergies, medications, problem list, medical history, surgical history, family history, social history, and previous encounter notes.  Time spent on visit including pre-visit chart review and post-visit care and charting was 31 minutes.    Wilhemena Durie, am acting as transcriptionist for [employer], PA-C.  I have reviewed the above documentation for accuracy and completeness, and I agree with the above. -  *Abby Potash, PA-C

## 2020-06-29 ENCOUNTER — Other Ambulatory Visit: Payer: Self-pay | Admitting: Gynecology

## 2020-06-29 DIAGNOSIS — Z1231 Encounter for screening mammogram for malignant neoplasm of breast: Secondary | ICD-10-CM

## 2020-07-04 ENCOUNTER — Other Ambulatory Visit: Payer: Self-pay

## 2020-07-04 ENCOUNTER — Encounter (INDEPENDENT_AMBULATORY_CARE_PROVIDER_SITE_OTHER): Payer: Self-pay | Admitting: Physician Assistant

## 2020-07-04 ENCOUNTER — Ambulatory Visit (INDEPENDENT_AMBULATORY_CARE_PROVIDER_SITE_OTHER): Payer: BC Managed Care – PPO | Admitting: Physician Assistant

## 2020-07-04 VITALS — BP 143/82 | HR 73 | Temp 97.8°F | Ht 67.0 in | Wt 228.0 lb

## 2020-07-04 DIAGNOSIS — Z6834 Body mass index (BMI) 34.0-34.9, adult: Secondary | ICD-10-CM

## 2020-07-04 DIAGNOSIS — E669 Obesity, unspecified: Secondary | ICD-10-CM | POA: Diagnosis not present

## 2020-07-04 DIAGNOSIS — E8881 Metabolic syndrome: Secondary | ICD-10-CM | POA: Diagnosis not present

## 2020-07-04 DIAGNOSIS — Z9189 Other specified personal risk factors, not elsewhere classified: Secondary | ICD-10-CM

## 2020-07-04 MED ORDER — RYBELSUS 3 MG PO TABS: 3.0000 mg | ORAL_TABLET | Freq: Every morning | ORAL | 0 refills | Status: DC

## 2020-07-05 NOTE — Progress Notes (Signed)
Chief Complaint:   OBESITY Tonya Taylor is here to discuss her progress with her obesity treatment plan along with follow-up of her obesity related diagnoses. Tonya Taylor is on the Category 3 Plan and states she is following her eating plan approximately 85% of the time. Tonya Taylor states she is walking for 30 minutes 7 times per week.  Today's visit was #: 14 Starting weight: 237 lbs Starting date: 09/30/2019 Today's weight: 228 lbs Today's date: 07/04/2020 Total lbs lost to date: 9 Total lbs lost since last in-office visit: 0  Interim History: Tonya Taylor reports that she has been eating more tortilla chips than she usually does. She is not drinking enough water.  Subjective:   1. Metabolic syndrome Tonya Taylor reports polyphagia. Her blood pressure is elevated and HDL is not at goal.  2. At risk for diabetes mellitus Tonya Taylor is at higher than average risk for developing diabetes due to obesity.   Assessment/Plan:   1. Metabolic syndrome Tonya Taylor agreed to start Rybelsus 3 mg q daily #30 with no refills.  2. At risk for diabetes mellitus Tonya Taylor was given approximately 15 minutes of diabetes education and counseling today. We discussed intensive lifestyle modifications today with an emphasis on weight loss as well as increasing exercise and decreasing simple carbohydrates in her diet. We also reviewed medication options with an emphasis on risk versus benefit of those discussed.   Repetitive spaced learning was employed today to elicit superior memory formation and behavioral change.  3. Class 1 obesity with serious comorbidity and body mass index (BMI) of 34.0 to 34.9 in adult, unspecified obesity type Tonya Taylor is currently in the action stage of change. As such, her goal is to continue with weight loss efforts. She has agreed to the Category 3 Plan.   Exercise goals: As is.  Behavioral modification strategies: decreasing simple carbohydrates and increasing water intake.  Tonya Taylor  has agreed to follow-up with our clinic in 2 weeks. She was informed of the importance of frequent follow-up visits to maximize her success with intensive lifestyle modifications for her multiple health conditions.   Objective:   Blood pressure (!) 143/82, pulse 73, temperature 97.8 F (36.6 C), height 5' 7"  (1.702 m), weight 228 lb (103.4 kg), SpO2 98 %. Body mass index is 35.71 kg/m.  General: Cooperative, alert, well developed, in no acute distress. HEENT: Conjunctivae and lids unremarkable. Cardiovascular: Regular rhythm.  Lungs: Normal work of breathing. Neurologic: No focal deficits.   Lab Results  Component Value Date   CREATININE 0.96 09/21/2019   BUN 12 09/21/2019   NA 137 09/21/2019   K 4.1 09/21/2019   CL 101 09/21/2019   CO2 29 09/21/2019   Lab Results  Component Value Date   ALT 29 09/21/2019   AST 29 09/21/2019   ALKPHOS 58 09/21/2019   BILITOT 0.5 09/21/2019   Lab Results  Component Value Date   HGBA1C 5.6 09/30/2019   HGBA1C 5.6 03/19/2016   HGBA1C 5.5 03/16/2015   HGBA1C 5.6 03/01/2014   Lab Results  Component Value Date   INSULIN 21.5 09/30/2019   Lab Results  Component Value Date   TSH 3.19 09/21/2019   Lab Results  Component Value Date   CHOL 192 09/21/2019   HDL 55.40 09/21/2019   LDLCALC 118 (H) 09/21/2019   TRIG 94.0 09/21/2019   CHOLHDL 3 09/21/2019   Lab Results  Component Value Date   WBC 4.5 09/21/2019   HGB 14.9 09/21/2019   HCT 44.4 09/21/2019   MCV 88.7  09/21/2019   PLT 220.0 09/21/2019   Lab Results  Component Value Date   IRON 61 03/20/2017   Attestation Statements:   Reviewed by clinician on day of visit: allergies, medications, problem list, medical history, surgical history, family history, social history, and previous encounter notes.   Wilhemena Durie, am acting as transcriptionist for [employer], PA-C.  I have reviewed the above documentation for accuracy and completeness, and I agree with the above. Abby Potash, PA-C

## 2020-07-06 ENCOUNTER — Encounter (INDEPENDENT_AMBULATORY_CARE_PROVIDER_SITE_OTHER): Payer: Self-pay | Admitting: Physician Assistant

## 2020-07-06 ENCOUNTER — Encounter (INDEPENDENT_AMBULATORY_CARE_PROVIDER_SITE_OTHER): Payer: Self-pay

## 2020-07-07 NOTE — Telephone Encounter (Signed)
Last OV with Linus Orn

## 2020-07-25 ENCOUNTER — Other Ambulatory Visit: Payer: Self-pay

## 2020-07-25 ENCOUNTER — Encounter (INDEPENDENT_AMBULATORY_CARE_PROVIDER_SITE_OTHER): Payer: Self-pay | Admitting: Physician Assistant

## 2020-07-25 ENCOUNTER — Ambulatory Visit (INDEPENDENT_AMBULATORY_CARE_PROVIDER_SITE_OTHER): Payer: BC Managed Care – PPO | Admitting: Physician Assistant

## 2020-07-25 VITALS — BP 156/76 | HR 73 | Temp 98.0°F | Ht 67.0 in | Wt 230.0 lb

## 2020-07-25 DIAGNOSIS — E785 Hyperlipidemia, unspecified: Secondary | ICD-10-CM | POA: Diagnosis not present

## 2020-07-25 DIAGNOSIS — Z9189 Other specified personal risk factors, not elsewhere classified: Secondary | ICD-10-CM | POA: Diagnosis not present

## 2020-07-25 DIAGNOSIS — Z6835 Body mass index (BMI) 35.0-35.9, adult: Secondary | ICD-10-CM

## 2020-07-25 DIAGNOSIS — E8881 Metabolic syndrome: Secondary | ICD-10-CM

## 2020-07-28 NOTE — Progress Notes (Signed)
Chief Complaint:   OBESITY Tonya Taylor is here to discuss her progress with her obesity treatment plan along with follow-up of her obesity related diagnoses. Tonya Taylor is on the Category 3 Plan and states she is following her eating plan approximately 90-95% of the time. Tonya Taylor states she is walking for 15-20 minutes 2 times per week.  Today's visit was #: 15 Starting weight: 237 lbs Starting date: 09/30/2019 Today's weight: 230 lbs Today's date: 07/25/2020 Total lbs lost to date: 7 Total lbs lost since last in-office visit: 0  Interim History: Tonya Taylor reports feeling tired in general, and she is tired of work and tired of trying to figure out what to eat. Breakfast and lunch are on the plan, but she struggles with dinner. She did not yet start on Rybelsus.  Subjective:   1. Metabolic syndrome  Tonya Taylor has not yet started Rybelsus due to having questions regarding the medication. I discussed labs with the patient today.  2. Hyperlipidemia, unspecified hyperlipidemia type Tonya Taylor is on Omega 3, but no other medications.  3. At risk for heart disease Tonya Taylor is at a higher than average risk for cardiovascular disease due to obesity.   Assessment/Plan:   1. Metabolic syndrome Tonya Taylor agreed to start Rybelsus and continue her meal plan. A1c and insulin information was reviewed today.  2. Hyperlipidemia, unspecified hyperlipidemia type Cardiovascular risk and specific lipid/LDL goals reviewed. We discussed several lifestyle modifications today. Tonya Taylor will continue her meal plan, and will to work on diet, exercise and weight loss efforts. Orders and follow up as documented in patient record.   Counseling Intensive lifestyle modifications are the first line treatment for this issue. . Dietary changes: Increase soluble fiber. Decrease simple carbohydrates. . Exercise changes: Moderate to vigorous-intensity aerobic activity 150 minutes per week if tolerated. . Lipid-lowering  medications: see documented in medical record.  3. At risk for heart disease Tonya Taylor was given approximately 15 minutes of coronary artery disease prevention counseling today. She is 65 y.o. female and has risk factors for heart disease including obesity. We discussed intensive lifestyle modifications today with an emphasis on specific weight loss instructions and strategies.   Repetitive spaced learning was employed today to elicit superior memory formation and behavioral change.  4. Class 2 severe obesity with serious comorbidity and body mass index (BMI) of 35.0 to 35.9 in adult, unspecified obesity type (Tonya Taylor) Tonya Taylor is currently in the action stage of change. As such, her goal is to continue with weight loss efforts. She has agreed to the Category 3 Plan.   Exercise goals: As is.  We will recheck fasting labs at her next visit.  Behavioral modification strategies: meal planning and cooking strategies and keeping healthy foods in the home.  Tonya Taylor has agreed to follow-up with our clinic in 3 to 4 weeks. She was informed of the importance of frequent follow-up visits to maximize her success with intensive lifestyle modifications for her multiple health conditions.   Objective:   Blood pressure (!) 156/76, pulse 73, temperature 98 F (36.7 C), height 5' 7"  (1.702 m), weight 230 lb (104.3 kg), SpO2 97 %. Body mass index is 36.02 kg/m.  General: Cooperative, alert, well developed, in no acute distress. HEENT: Conjunctivae and lids unremarkable. Cardiovascular: Regular rhythm.  Lungs: Normal work of breathing. Neurologic: No focal deficits.   Lab Results  Component Value Date   CREATININE 0.96 09/21/2019   BUN 12 09/21/2019   NA 137 09/21/2019   K 4.1 09/21/2019   CL 101  09/21/2019   CO2 29 09/21/2019   Lab Results  Component Value Date   ALT 29 09/21/2019   AST 29 09/21/2019   ALKPHOS 58 09/21/2019   BILITOT 0.5 09/21/2019   Lab Results  Component Value Date    HGBA1C 5.6 09/30/2019   HGBA1C 5.6 03/19/2016   HGBA1C 5.5 03/16/2015   HGBA1C 5.6 03/01/2014   Lab Results  Component Value Date   INSULIN 21.5 09/30/2019   Lab Results  Component Value Date   TSH 3.19 09/21/2019   Lab Results  Component Value Date   CHOL 192 09/21/2019   HDL 55.40 09/21/2019   LDLCALC 118 (H) 09/21/2019   TRIG 94.0 09/21/2019   CHOLHDL 3 09/21/2019   Lab Results  Component Value Date   WBC 4.5 09/21/2019   HGB 14.9 09/21/2019   HCT 44.4 09/21/2019   MCV 88.7 09/21/2019   PLT 220.0 09/21/2019   Lab Results  Component Value Date   IRON 61 03/20/2017   Attestation Statements:   Reviewed by clinician on day of visit: allergies, medications, problem list, medical history, surgical history, family history, social history, and previous encounter notes.  Time spent on visit including pre-visit chart review and post-visit care and charting was 30 minutes.    Wilhemena Durie, am acting as transcriptionist for [employer], PA-C.  I have reviewed the above documentation for accuracy and completeness, and I agree with the above. Abby Potash, PA-C

## 2020-08-16 ENCOUNTER — Encounter (INDEPENDENT_AMBULATORY_CARE_PROVIDER_SITE_OTHER): Payer: Self-pay

## 2020-08-17 ENCOUNTER — Ambulatory Visit (INDEPENDENT_AMBULATORY_CARE_PROVIDER_SITE_OTHER): Payer: BC Managed Care – PPO | Admitting: Physician Assistant

## 2020-08-19 ENCOUNTER — Inpatient Hospital Stay: Admission: RE | Admit: 2020-08-19 | Payer: BC Managed Care – PPO | Source: Ambulatory Visit

## 2020-08-22 ENCOUNTER — Ambulatory Visit (INDEPENDENT_AMBULATORY_CARE_PROVIDER_SITE_OTHER): Payer: BC Managed Care – PPO | Admitting: Physician Assistant

## 2020-08-22 ENCOUNTER — Other Ambulatory Visit: Payer: Self-pay

## 2020-08-22 ENCOUNTER — Encounter (INDEPENDENT_AMBULATORY_CARE_PROVIDER_SITE_OTHER): Payer: Self-pay | Admitting: Physician Assistant

## 2020-08-22 VITALS — BP 131/75 | HR 63 | Temp 97.9°F | Ht 67.0 in | Wt 231.0 lb

## 2020-08-22 DIAGNOSIS — E8881 Metabolic syndrome: Secondary | ICD-10-CM | POA: Diagnosis not present

## 2020-08-22 DIAGNOSIS — E559 Vitamin D deficiency, unspecified: Secondary | ICD-10-CM

## 2020-08-22 DIAGNOSIS — Z6837 Body mass index (BMI) 37.0-37.9, adult: Secondary | ICD-10-CM

## 2020-08-22 DIAGNOSIS — Z9189 Other specified personal risk factors, not elsewhere classified: Secondary | ICD-10-CM | POA: Diagnosis not present

## 2020-08-22 DIAGNOSIS — E7849 Other hyperlipidemia: Secondary | ICD-10-CM

## 2020-08-22 DIAGNOSIS — E785 Hyperlipidemia, unspecified: Secondary | ICD-10-CM

## 2020-08-22 DIAGNOSIS — R739 Hyperglycemia, unspecified: Secondary | ICD-10-CM

## 2020-08-22 MED ORDER — RYBELSUS 3 MG PO TABS: 3.0000 mg | ORAL_TABLET | Freq: Every morning | ORAL | 0 refills | Status: DC

## 2020-08-23 LAB — LIPID PANEL
Chol/HDL Ratio: 3.3 ratio (ref 0.0–4.4)
Cholesterol, Total: 156 mg/dL (ref 100–199)
HDL: 47 mg/dL (ref >39)
LDL Chol Calc (NIH): 96 mg/dL (ref 0–99)
Triglycerides: 66 mg/dL (ref 0–149)
VLDL Cholesterol Cal: 13 mg/dL (ref 5–40)

## 2020-08-23 LAB — VITAMIN D 25 HYDROXY (VIT D DEFICIENCY, FRACTURES): Vit D, 25-Hydroxy: 30.3 ng/mL (ref 30.0–100.0)

## 2020-08-23 LAB — COMPREHENSIVE METABOLIC PANEL
ALT: 31 IU/L (ref 0–32)
AST: 26 IU/L (ref 0–40)
Albumin/Globulin Ratio: 1.3 (ref 1.2–2.2)
Albumin: 4.3 g/dL (ref 3.8–4.8)
Alkaline Phosphatase: 60 IU/L (ref 44–121)
BUN/Creatinine Ratio: 25 (ref 12–28)
BUN: 24 mg/dL (ref 8–27)
Bilirubin Total: 0.3 mg/dL (ref 0.0–1.2)
CO2: 24 mmol/L (ref 20–29)
Calcium: 9.3 mg/dL (ref 8.7–10.3)
Chloride: 99 mmol/L (ref 96–106)
Creatinine, Ser: 0.95 mg/dL (ref 0.57–1.00)
Globulin, Total: 3.2 g/dL (ref 1.5–4.5)
Glucose: 80 mg/dL (ref 65–99)
Potassium: 4.3 mmol/L (ref 3.5–5.2)
Sodium: 139 mmol/L (ref 134–144)
Total Protein: 7.5 g/dL (ref 6.0–8.5)
eGFR: 67 mL/min/{1.73_m2} (ref >59)

## 2020-08-23 LAB — HEMOGLOBIN A1C
Est. average glucose Bld gHb Est-mCnc: 117 mg/dL
Hgb A1c MFr Bld: 5.7 % — ABNORMAL HIGH (ref 4.8–5.6)

## 2020-08-23 LAB — INSULIN, RANDOM: INSULIN: 16.8 u[IU]/mL (ref 2.6–24.9)

## 2020-08-23 NOTE — Progress Notes (Signed)
Chief Complaint:   OBESITY Tonya Taylor is here to discuss her progress with her obesity treatment plan along with follow-up of her obesity related diagnoses. Tonya Taylor is on the Category 3 Plan and states she is following her eating plan approximately 95% of the time. Tonya Taylor states she is walking for 20 minutes 5 times per week.  Today's visit was #: 75 Starting weight: 237 lbs Starting date: 09/30/2019 Today's weight: 231 lbs Today's date: 08/22/2020 Total lbs lost to date: 6 lbs Total lbs lost since last in-office visit: 0  Interim History: Tonya Taylor is frustrated with lack of weight loss today. She has cut out her cheese puffs and tortilla chips. Her hunger is well controlled with Rybelsus 3 mg.  Subjective:   1. Metabolic syndrome Rybelsus helped control appetite. She is snacking less. She demies nausea, vomiting, or diarrhea.  2. Vitamin D deficiency Tonya Taylor's Vitamin D level was 55.07 on 09/21/2019. She is currently taking OTC caulcium with Vitamin D. She denies nausea, vomiting or muscle weakness.  3. Hyperglycemia Tonya Taylor has a history of some elevated blood glucose readings without a diagnosis of diabetes. Last A1c was 5.6 and Insulin was 21.5.  4. Other hyperlipidemia Medication: Zocor and Omega 3. Patient denies myalgias or chest pain.   Lab Results  Component Value Date   CHOL 156 08/22/2020   HDL 47 08/22/2020   LDLCALC 96 08/22/2020   TRIG 66 08/22/2020   CHOLHDL 3.3 08/22/2020   Lab Results  Component Value Date   ALT 31 08/22/2020   AST 26 08/22/2020   ALKPHOS 60 08/22/2020   BILITOT 0.3 08/22/2020   The 10-year ASCVD risk score Tonya Bussing DC Jr., et al., 2013) is: 7.3%   Values used to calculate the score:     Age: 65 years     Sex: Female     Is Non-Hispanic African American: Yes     Diabetic: No     Tobacco smoker: No     Systolic Blood Pressure: 960 mmHg     Is BP treated: Yes     HDL Cholesterol: 47 mg/dL     Total Cholesterol: 156  mg/dL  5. At risk for diabetes mellitus Tonya Taylor is at higher than average risk for developing diabetes due to obesity.   Assessment/Plan:   1. Metabolic syndrome We will check labs today and refill Rybelsus 3 mg for 1 month.  - Refill Semaglutide (RYBELSUS) 3 MG TABS; Take 3 mg by mouth in the morning. 30 min prior to breakfast  Dispense: 30 tablet; Refill: 0 - Comprehensive metabolic panel - Hemoglobin A1c - Insulin, random  2. Vitamin D deficiency Low Vitamin D level contributes to fatigue and are associated with obesity, breast, and colon cancer. She agrees to continue to take OTC Calcium and Vitamin D daily and we will check labs today.  - VITAMIN D 25 Hydroxy (Vit-D Deficiency, Fractures)  3. Hyperglycemia Fasting labs will be obtained and results with be discussed with Tonya Taylor in 2 weeks at her follow up visit. In the meanwhile Tonya Taylor was started on a lower simple carbohydrate diet and will work on weight loss efforts.  - Hemoglobin A1c - Insulin, random  4. Other hyperlipidemia Cardiovascular risk and specific lipid/LDL goals reviewed.  We discussed several lifestyle modifications today and Tonya Taylor will continue to work on diet, exercise and weight loss efforts. Orders and follow up as documented in patient record. Continue medications and we will check labs today.  Counseling Intensive lifestyle modifications are the  first line treatment for this issue. . Dietary changes: Increase soluble fiber. Decrease simple carbohydrates. . Exercise changes: Moderate to vigorous-intensity aerobic activity 150 minutes per week if tolerated. . Lipid-lowering medications: see documented in medical record.  - Lipid panel  5. At risk for diabetes mellitus Tonya Taylor was given approximately 15 minutes of diabetes education and counseling today. We discussed intensive lifestyle modifications today with an emphasis on weight loss as well as increasing exercise and decreasing simple  carbohydrates in her diet. We also reviewed medication options with an emphasis on risk versus benefit of those discussed.   Repetitive spaced learning was employed today to elicit superior memory formation and behavioral change.  6. Obesity, Current BMI 36.2 Tonya Taylor is currently in the action stage of change. As such, her goal is to continue with weight loss efforts. She has agreed to change to the Category 2 Plan.   Exercise goals: As is.  Behavioral modification strategies: meal planning and cooking strategies and keeping healthy foods in the home.  Tonya Taylor has agreed to follow-up with our clinic in 2 weeks. She was informed of the importance of frequent follow-up visits to maximize her success with intensive lifestyle modifications for her multiple health conditions.   Tonya Taylor was informed we would discuss her lab results at her next visit unless there is a critical issue that needs to be addressed sooner. Tonya Taylor agreed to keep her next visit at the agreed upon time to discuss these results.  Objective:   Blood pressure 131/75, pulse 63, temperature 97.9 F (36.6 C), height 5' 7"  (1.702 m), weight 231 lb (104.8 kg), SpO2 97 %. Body mass index is 36.18 kg/m.  General: Cooperative, alert, well developed, in no acute distress. HEENT: Conjunctivae and lids unremarkable. Cardiovascular: Regular rhythm.  Lungs: Normal work of breathing. Neurologic: No focal deficits.   Lab Results  Component Value Date   HGBA1C 5.6 09/30/2019   HGBA1C 5.6 03/19/2016   HGBA1C 5.5 03/16/2015   HGBA1C 5.6 03/01/2014   Lab Results  Component Value Date   INSULIN 21.5 09/30/2019   Lab Results  Component Value Date   TSH 3.19 09/21/2019   Lab Results  Component Value Date   WBC 4.5 09/21/2019   HGB 14.9 09/21/2019   HCT 44.4 09/21/2019   MCV 88.7 09/21/2019   PLT 220.0 09/21/2019   Lab Results  Component Value Date   IRON 61 03/20/2017   Attestation Statements:   Reviewed by  clinician on day of visit: allergies, medications, problem list, medical history, surgical history, family history, social history, and previous encounter notes.  Leodis Binet Friedenbach, CMA, am acting as Location manager for [employer], PA-C.  I have reviewed the above documentation for accuracy and completeness, and I agree with the above. Abby Potash, PA-C

## 2020-09-04 ENCOUNTER — Other Ambulatory Visit: Payer: Self-pay | Admitting: Family

## 2020-09-07 ENCOUNTER — Encounter (INDEPENDENT_AMBULATORY_CARE_PROVIDER_SITE_OTHER): Payer: Self-pay

## 2020-09-08 ENCOUNTER — Ambulatory Visit (INDEPENDENT_AMBULATORY_CARE_PROVIDER_SITE_OTHER): Payer: BC Managed Care – PPO | Admitting: Physician Assistant

## 2020-09-14 ENCOUNTER — Other Ambulatory Visit: Payer: Self-pay

## 2020-09-14 ENCOUNTER — Other Ambulatory Visit: Payer: Self-pay | Admitting: Family

## 2020-09-14 ENCOUNTER — Ambulatory Visit
Admission: RE | Admit: 2020-09-14 | Discharge: 2020-09-14 | Disposition: A | Discharge status: Home / Self Care | Payer: BC Managed Care – PPO | Source: Ambulatory Visit | Attending: Gynecology | Admitting: Gynecology

## 2020-09-14 DIAGNOSIS — Z1231 Encounter for screening mammogram for malignant neoplasm of breast: Secondary | ICD-10-CM

## 2020-09-27 ENCOUNTER — Other Ambulatory Visit: Payer: Self-pay

## 2020-09-27 ENCOUNTER — Encounter (INDEPENDENT_AMBULATORY_CARE_PROVIDER_SITE_OTHER): Payer: Self-pay | Admitting: Physician Assistant

## 2020-09-27 ENCOUNTER — Ambulatory Visit (INDEPENDENT_AMBULATORY_CARE_PROVIDER_SITE_OTHER): Payer: BC Managed Care – PPO | Admitting: Physician Assistant

## 2020-09-27 VITALS — BP 117/71 | HR 76 | Temp 97.8°F | Ht 67.0 in | Wt 226.0 lb

## 2020-09-27 DIAGNOSIS — E8881 Metabolic syndrome: Secondary | ICD-10-CM

## 2020-09-27 DIAGNOSIS — Z6836 Body mass index (BMI) 36.0-36.9, adult: Secondary | ICD-10-CM | POA: Diagnosis not present

## 2020-09-27 DIAGNOSIS — Z9189 Other specified personal risk factors, not elsewhere classified: Secondary | ICD-10-CM | POA: Diagnosis not present

## 2020-09-27 MED ORDER — RYBELSUS 7 MG PO TABS: 7.0000 mg | ORAL_TABLET | Freq: Every day | ORAL | 0 refills | Status: DC

## 2020-09-28 NOTE — Progress Notes (Signed)
Chief Complaint:   OBESITY Tonya Taylor is here to discuss her progress with her obesity treatment plan along with follow-up of her obesity related diagnoses. Tonya Taylor is on the Category 2 Plan and states she is following her eating plan approximately 90-95% of the time. Tonya Taylor states she is walking for 20 minutes 5 times per week.  Today's visit was #: 10 Starting weight: 237 lbs Starting date: 09/30/2019 Today's weight: 226 lbs Today's date: 09/27/2020 Total lbs lost to date: 11 Total lbs lost since last in-office visit: 5  Interim History: Tonya Taylor did well with weight loss. She is portion controlling when she is not making the best choices. Otherwise, Rybelsus 3 mg is helping decrease her appetite.  Subjective:   1. Metabolic syndrome Tonya Taylor denies side effects with Rybelsus. She notes it is helping her portion control and decreased her appetite.  2. At risk for diabetes mellitus Tonya Taylor is at higher than average risk for developing diabetes due to obesity.   Assessment/Plan:   1. Metabolic syndrome Tonya Taylor agreed to increase Rybelsus to 7 mg q daily with no refills. She will continue to follow up as directed.  - Semaglutide (RYBELSUS) 7 MG TABS; Take 7 mg by mouth daily.  Dispense: 30 tablet; Refill: 0  2. At risk for diabetes mellitus Tonya Taylor was given approximately 15 minutes of diabetes education and counseling today. We discussed intensive lifestyle modifications today with an emphasis on weight loss as well as increasing exercise and decreasing simple carbohydrates in her diet. We also reviewed medication options with an emphasis on risk versus benefit of those discussed.   Repetitive spaced learning was employed today to elicit superior memory formation and behavioral change.  3. Class 2 severe obesity with serious comorbidity and body mass index (BMI) of 36.0 to 36.9 in adult, unspecified obesity type (Tonya Taylor) Tonya Taylor is currently in the action stage of change.  As such, her goal is to continue with weight loss efforts. She has agreed to the Category 2 Plan.   Exercise goals: As is.  Behavioral modification strategies: meal planning and cooking strategies and keeping healthy foods in the home.  Tonya Taylor has agreed to follow-up with our clinic in 4 weeks. She was informed of the importance of frequent follow-up visits to maximize her success with intensive lifestyle modifications for her multiple health conditions.   Objective:   Blood pressure 117/71, pulse 76, temperature 97.8 F (36.6 C), height 5' 7"  (1.702 m), weight 226 lb (102.5 kg), SpO2 98 %. Body mass index is 35.4 kg/m.  General: Cooperative, alert, well developed, in no acute distress. HEENT: Conjunctivae and lids unremarkable. Cardiovascular: Regular rhythm.  Lungs: Normal work of breathing. Neurologic: No focal deficits.   Lab Results  Component Value Date   CREATININE 0.95 08/22/2020   BUN 24 08/22/2020   NA 139 08/22/2020   K 4.3 08/22/2020   CL 99 08/22/2020   CO2 24 08/22/2020   Lab Results  Component Value Date   ALT 31 08/22/2020   AST 26 08/22/2020   ALKPHOS 60 08/22/2020   BILITOT 0.3 08/22/2020   Lab Results  Component Value Date   HGBA1C 5.7 (H) 08/22/2020   HGBA1C 5.6 09/30/2019   HGBA1C 5.6 03/19/2016   HGBA1C 5.5 03/16/2015   HGBA1C 5.6 03/01/2014   Lab Results  Component Value Date   INSULIN 16.8 08/22/2020   INSULIN 21.5 09/30/2019   Lab Results  Component Value Date   TSH 3.19 09/21/2019   Lab Results  Component  Value Date   CHOL 156 08/22/2020   HDL 47 08/22/2020   LDLCALC 96 08/22/2020   TRIG 66 08/22/2020   CHOLHDL 3.3 08/22/2020   Lab Results  Component Value Date   WBC 4.5 09/21/2019   HGB 14.9 09/21/2019   HCT 44.4 09/21/2019   MCV 88.7 09/21/2019   PLT 220.0 09/21/2019   Lab Results  Component Value Date   IRON 61 03/20/2017   Attestation Statements:   Reviewed by clinician on day of visit: allergies, medications,  problem list, medical history, surgical history, family history, social history, and previous encounter notes.   Wilhemena Durie, am acting as transcriptionist for [employer], PA-C.  I have reviewed the above documentation for accuracy and completeness, and I agree with the above. Abby Potash, PA-C

## 2020-10-04 ENCOUNTER — Encounter (INDEPENDENT_AMBULATORY_CARE_PROVIDER_SITE_OTHER): Payer: Self-pay

## 2020-10-13 ENCOUNTER — Encounter: Payer: Self-pay | Admitting: Family

## 2020-10-13 ENCOUNTER — Ambulatory Visit (INDEPENDENT_AMBULATORY_CARE_PROVIDER_SITE_OTHER): Payer: BC Managed Care – PPO | Admitting: Family

## 2020-10-13 ENCOUNTER — Other Ambulatory Visit: Payer: Self-pay

## 2020-10-13 VITALS — BP 122/80 | HR 76 | Temp 97.3°F | Resp 16 | Ht 67.0 in | Wt 230.0 lb

## 2020-10-13 DIAGNOSIS — E7849 Other hyperlipidemia: Secondary | ICD-10-CM

## 2020-10-13 DIAGNOSIS — I10 Essential (primary) hypertension: Secondary | ICD-10-CM | POA: Diagnosis not present

## 2020-10-13 DIAGNOSIS — Z0000 Encounter for general adult medical examination without abnormal findings: Secondary | ICD-10-CM

## 2020-10-13 DIAGNOSIS — M25562 Pain in left knee: Secondary | ICD-10-CM | POA: Diagnosis not present

## 2020-10-13 MED ORDER — SIMVASTATIN 20 MG PO TABS: 20.0000 mg | ORAL_TABLET | Freq: Every day | ORAL | 3 refills | Status: DC

## 2020-10-13 MED ORDER — MELOXICAM 7.5 MG PO TABS: ORAL_TABLET | ORAL | 0 refills | Status: DC

## 2020-10-13 MED ORDER — SPIRONOLACTONE 25 MG PO TABS: 1.0000 | ORAL_TABLET | Freq: Every day | ORAL | 3 refills | Status: DC

## 2020-10-13 MED ORDER — TELMISARTAN-HCTZ 80-25 MG PO TABS: 1.0000 | ORAL_TABLET | Freq: Every day | ORAL | 3 refills | Status: DC

## 2020-10-13 MED ORDER — FLUTICASONE PROPIONATE 50 MCG/ACT NA SUSP: 2.0000 | Freq: Every day | NASAL | 6 refills | Status: AC

## 2020-10-13 NOTE — Progress Notes (Signed)
Tonya Taylor is a 65 y.o. female with the following history as recorded in EpicCare:  Patient Active Problem List   Diagnosis Date Noted   Back pain 03/20/2017   URI (upper respiratory infection) 07/24/2016   Vitamin D deficiency 03/16/2015   Wellness examination 03/01/2014   Hyperglycemia 02/20/2013   Menopausal state 03/05/2012   Abnormal ECG 02/19/2011   NASH (nonalcoholic steatohepatitis) 02/07/2010   NEUTROPENIA UNSPECIFIED 01/31/2009   Iron deficiency anemia due to chronic blood loss 04/05/2008   FOOT PAIN, RIGHT 04/05/2008   ANKLE PAIN, LEFT 01/02/2008   EDEMA 01/02/2008   Dyslipidemia 02/01/2007   Essential hypertension 02/01/2007   INSOMNIA 02/01/2007   TOBACCO USE, QUIT 02/01/2007    Current Outpatient Medications  Medication Sig Dispense Refill   Ascorbic Acid (VITAMIN C) 1000 MG tablet Take 1,000 mg by mouth daily.     Calcium Carbonate-Vit D-Min (CALCIUM 600+D PLUS MINERALS PO) Take 2 tablets by mouth daily.     cetirizine (ZYRTEC) 10 MG tablet Take 10 mg by mouth daily.     Cholecalciferol (VITAMIN D3) 50 MCG (2000 UT) TABS Take 50 mcg by mouth.     Coenzyme Q10 (COQ10 PO) Take 1 capsule by mouth daily.     [employer] Fiber Gummies 2 g CHEW Chew by mouth.     Cyanocobalamin (VITAMIN B-12) 5000 MCG TBDP Take 5,000 mcg by mouth daily.     docusate sodium (COLACE) 100 MG capsule Take 100 mg by mouth 2 (two) times daily.     ECHINACEA PO Take 1 capsule by mouth daily. 770m     fluticasone (FLONASE) 50 MCG/ACT nasal spray Place 2 sprays into both nostrils daily. 16 g 6   Multiple Vitamin (MULTIVITAMIN) tablet Take 1 tablet by mouth daily.     Omega-3 1400 MG CAPS Take 1 capsule by mouth daily.     OVER THE COUNTER MEDICATION Hair, skin, nails gummies- 1 po daily     Semaglutide (RYBELSUS) 7 MG TABS Take 7 mg by mouth daily. 30 tablet 0   Vitamin E 400 units TABS Take 400 Units by mouth daily.     meloxicam (MOBIC) 7.5 MG tablet Take 1 tablet daily as directed 90  tablet 0   simvastatin (ZOCOR) 20 MG tablet Take 1 tablet (20 mg total) by mouth at bedtime. 90 tablet 3   spironolactone (ALDACTONE) 25 MG tablet Take 1 tablet (25 mg total) by mouth daily. 90 tablet 3   telmisartan-hydrochlorothiazide (MICARDIS HCT) 80-25 MG tablet Take 1 tablet by mouth daily. 90 tablet 3   No current facility-administered medications for this visit.    Allergies: Patient has no known allergies.  Past Medical History:  Diagnosis Date   Allergy    ANEMIA DUE TO CHRONIC BLOOD LOSS 04/05/2008   Edema 01/02/2008   Glaucoma    HYPERLIPIDEMIA 02/01/2007   HYPERTENSION 02/01/2007   INSOMNIA 02/01/2007   NEUTROPENIA UNSPECIFIED 01/31/2009   Obesity    Osteopenia    Other chronic nonalcoholic liver disease 132/0/2334  TOBACCO USE, QUIT 02/01/2007    Past Surgical History:  Procedure Laterality Date   COLONOSCOPY     DENTAL SURGERY     HAND SURGERY     removed a nerve    KNEE ARTHROSCOPY Left    ORIF SCAPHOID FRACTURE Right 07/08/2017   Procedure: OPEN REDUCTION INTERNAL FIXATION OF RIGHT SCAPHOID FRACTURE;  Surgeon: TMilly Jakob MD;  Location: [facility]  Service: Orthopedics;  Laterality: Right;  Family History  Problem Relation Age of Onset   Cancer Mother        Lung Cancer   Lung cancer Mother    Prostate cancer Father    Cancer Father    Thyroid disease Father    Colon cancer Neg Hx    Esophageal cancer Neg Hx    Liver cancer Neg Hx    Pancreatic cancer Neg Hx    Rectal cancer Neg Hx    Stomach cancer Neg Hx     Social History   Tobacco Use   Smoking status: Former    Pack years: 0.00    Types: Cigarettes   Smokeless tobacco: Never  Substance Use Topics   Alcohol use: No    Alcohol/week: 0.0 standard drinks    Subjective:   Patient presents for yearly CPE; does see GYN regularly- scheduled for July 2022; is working with [facility]- has lost at least 14 pounds since starting the program; labs done at the end of  April 2022;  Has been having increased left knee pain recently- thinks could have started with cutting grass/ ? Reaction to Rybelsus;  Sees eye doctor and dentist regularly;  Review of Systems  Constitutional: Negative.   HENT: Negative.    Eyes: Negative.   Respiratory: Negative.    Cardiovascular: Negative.   Gastrointestinal: Negative.   Genitourinary: Negative.   Musculoskeletal:  Positive for joint pain.  Skin: Negative.   Neurological: Negative.   Endo/Heme/Allergies: Negative.   Psychiatric/Behavioral: Negative.      Objective:  Vitals:   10/13/20 0817  BP: 122/80  Pulse: 76  Resp: 16  Temp: (!) 97.3 F (36.3 C)  SpO2: 99%  Weight: 230 lb (104.3 kg)  Height: 5' 7"  (1.702 m)    General: Well developed, well nourished, in no acute distress  Skin : Warm and dry.  Head: Normocephalic and atraumatic  Eyes: Sclera and conjunctiva clear; pupils round and reactive to light; extraocular movements intact  Ears: External normal; canals clear; tympanic membranes normal  Oropharynx: Pink, supple. No suspicious lesions  Neck: Supple without thyromegaly, adenopathy  Lungs: Respirations unlabored; clear to auscultation bilaterally without wheeze, rales, rhonchi  CVS exam: normal rate and regular rhythm.  Abdomen: Soft; nontender; nondistended; normoactive bowel sounds; no masses or hepatosplenomegaly  Musculoskeletal: No deformities; no active joint inflammation  Extremities: No edema, cyanosis, clubbing  Vessels: Symmetric bilaterally  Neurologic: Alert and oriented; speech intact; face symmetrical; moves all extremities well; CNII-XII intact without focal deficit   Assessment:  1. PE (physical exam), annual   2. Essential hypertension   3. Other hyperlipidemia   4. Acute pain of left knee     Plan:  Age appropriate preventive healthcare needs addressed; encouraged regular eye doctor and dental exams; encouraged regular exercise; will update labs and refills as needed  today; follow-up to be determined; Try Mobic 7.5 mg daily x 1 week- if no improvement, call back and will refer to sports medicine;  This visit occurred during the SARS-CoV-2 public health emergency.  Safety protocols were in place, including screening questions prior to the visit, additional usage of staff PPE, and extensive cleaning of exam room while observing appropriate contact time as indicated for disinfecting solutions.    No follow-ups on file.  No orders of the defined types were placed in this encounter.   Requested Prescriptions   Signed Prescriptions Disp Refills   simvastatin (ZOCOR) 20 MG tablet 90 tablet 3    Sig: Take  1 tablet (20 mg total) by mouth at bedtime.   spironolactone (ALDACTONE) 25 MG tablet 90 tablet 3    Sig: Take 1 tablet (25 mg total) by mouth daily.   telmisartan-hydrochlorothiazide (MICARDIS HCT) 80-25 MG tablet 90 tablet 3    Sig: Take 1 tablet by mouth daily.   fluticasone (FLONASE) 50 MCG/ACT nasal spray 16 g 6    Sig: Place 2 sprays into both nostrils daily.   meloxicam (MOBIC) 7.5 MG tablet 90 tablet 0    Sig: Take 1 tablet daily as directed

## 2020-10-25 ENCOUNTER — Other Ambulatory Visit (INDEPENDENT_AMBULATORY_CARE_PROVIDER_SITE_OTHER): Payer: Self-pay | Admitting: Physician Assistant

## 2020-10-25 ENCOUNTER — Ambulatory Visit (INDEPENDENT_AMBULATORY_CARE_PROVIDER_SITE_OTHER): Payer: BC Managed Care – PPO | Admitting: Physician Assistant

## 2020-10-25 DIAGNOSIS — E8881 Metabolic syndrome: Secondary | ICD-10-CM

## 2020-10-25 NOTE — Telephone Encounter (Signed)
Last seen by Tracey 

## 2020-10-27 ENCOUNTER — Ambulatory Visit (INDEPENDENT_AMBULATORY_CARE_PROVIDER_SITE_OTHER): Payer: BC Managed Care – PPO | Admitting: Family Medicine

## 2020-10-31 ENCOUNTER — Other Ambulatory Visit: Payer: Self-pay

## 2020-10-31 ENCOUNTER — Ambulatory Visit (INDEPENDENT_AMBULATORY_CARE_PROVIDER_SITE_OTHER): Payer: BC Managed Care – PPO | Admitting: Family Medicine

## 2020-10-31 ENCOUNTER — Encounter (INDEPENDENT_AMBULATORY_CARE_PROVIDER_SITE_OTHER): Payer: Self-pay | Admitting: Family Medicine

## 2020-10-31 VITALS — BP 124/80 | HR 63 | Temp 98.2°F | Ht 67.0 in | Wt 223.0 lb

## 2020-10-31 DIAGNOSIS — E8881 Metabolic syndrome: Secondary | ICD-10-CM | POA: Diagnosis not present

## 2020-10-31 DIAGNOSIS — Z9189 Other specified personal risk factors, not elsewhere classified: Secondary | ICD-10-CM | POA: Diagnosis not present

## 2020-10-31 DIAGNOSIS — Z6836 Body mass index (BMI) 36.0-36.9, adult: Secondary | ICD-10-CM | POA: Diagnosis not present

## 2020-10-31 DIAGNOSIS — E559 Vitamin D deficiency, unspecified: Secondary | ICD-10-CM | POA: Diagnosis not present

## 2020-10-31 MED ORDER — RYBELSUS 7 MG PO TABS: 7.0000 mg | ORAL_TABLET | Freq: Every day | ORAL | 0 refills | Status: DC

## 2020-11-01 NOTE — Progress Notes (Signed)
Chief Complaint:   OBESITY Tonya Taylor is here to discuss her progress with her obesity treatment plan along with follow-up of her obesity related diagnoses. [name] is on the Category 3 Plan and states she is following her eating plan approximately 98% of the time. [name] states she is walking 20 minutes 5 times per week.  Today's visit was #: 20 Starting weight: 237 lbs Starting date: 09/30/2019 Today's weight: 223 lbs Today's date: 10/31/2020 Total lbs lost to date: 14 Total lbs lost since last in-office visit: 3  Interim History: [name] is being more mindful of food portions. She eats out for supper more frequently and so she eats off plan but is mindful of choices at that time. She is eating at [facility] and [employer]. She is leaving in 4 days to go to [place] and will be traveling for 10 days.  Subjective:   1. Metabolic syndrome Tonya Taylor is on Rybelsus 7 mg daily. She denies GI side effects.  2. Vitamin D deficiency Tonya Taylor is not on prescription Vit D. Her last Vit D level was 30.3.  3. At increased risk of exposure to COVID-19 virus The patient is at higher risk of COVID-19 infection due to traveling to [place].   Assessment/Plan:   1. Metabolic syndrome We will refill Rybelsus 7 mg, as prescribed below.  - Semaglutide (RYBELSUS) 7 MG TABS; Take 7 mg by mouth daily.  Dispense: 30 tablet; Refill: 0  2. Vitamin D deficiency Low Vitamin D level contributes to fatigue and are associated with obesity, breast, and colon cancer. She agrees to continue to take OTC  Vitamin D @2 ,000 IU daily and will follow-up for routine testing of Vitamin D, at least 2-3 times per year to avoid over-replacement.  3. At increased risk of exposure to COVID-19 virus Tonya Taylor was given approximately 15 minutes of COVID prevention counseling today.  Counseling COVID-19 is a respiratory infection that is caused by a virus. It can cause serious infections, such as pneumonia, acute respiratory distress  syndrome, acute respiratory failure, or sepsis. You are more likely to develop a serious illness if you are 24 years of age or older, have a weak immune system, live in a nursing home, have chronic disease, or have obesity. Get vaccinated as soon as they are available to you.  For our most current information, please visit [email or web address]. Wash your hands often with soap and water for 20 seconds. If soap and water are not available, use alcohol-based hand sanitizer. Wear a face mask. Make sure your mask covers your nose and mouth. Maintain at least 6 feet distance from others when in public.  Get help right away if You have trouble breathing, chest pain, confusion, or other concerning symptoms.  Repetitive spaced learning was employed today to elicit superior memory formation and behavioral change.  4. Class 2 severe obesity with serious comorbidity and body mass index (BMI) of 36.0 to 36.9 in adult, unspecified obesity type ([place])  Tonya Taylor is currently in the action stage of change. As such, her goal is to continue with weight loss efforts. She has agreed to the Category 2 Plan.   Exercise goals: All adults should avoid inactivity. Some physical activity is better than none, and adults who participate in any amount of physical activity gain some health benefits.  Behavioral modification strategies: increasing lean protein intake, meal planning and cooking strategies, keeping healthy foods in the home, and planning for success.  Tonya Taylor has agreed to follow-up with our clinic in 3 weeks.  She was informed of the importance of frequent follow-up visits to maximize her success with intensive lifestyle modifications for her multiple health conditions.   Objective:   Blood pressure 124/80, pulse 63, temperature 98.2 F (36.8 C), height 5' 7"  (1.702 m), weight 223 lb (101.2 kg), SpO2 99 %. Body mass index is 34.93 kg/m.  General: Cooperative, alert, well developed, in no acute  distress. HEENT: Conjunctivae and lids unremarkable. Cardiovascular: Regular rhythm.  Lungs: Normal work of breathing. Neurologic: No focal deficits.   Lab Results  Component Value Date   CREATININE 0.95 08/22/2020   BUN 24 08/22/2020   NA 139 08/22/2020   K 4.3 08/22/2020   CL 99 08/22/2020   CO2 24 08/22/2020   Lab Results  Component Value Date   ALT 31 08/22/2020   AST 26 08/22/2020   ALKPHOS 60 08/22/2020   BILITOT 0.3 08/22/2020   Lab Results  Component Value Date   HGBA1C 5.7 (H) 08/22/2020   HGBA1C 5.6 09/30/2019   HGBA1C 5.6 03/19/2016   HGBA1C 5.5 03/16/2015   HGBA1C 5.6 03/01/2014   Lab Results  Component Value Date   INSULIN 16.8 08/22/2020   INSULIN 21.5 09/30/2019   Lab Results  Component Value Date   TSH 3.19 09/21/2019   Lab Results  Component Value Date   CHOL 156 08/22/2020   HDL 47 08/22/2020   LDLCALC 96 08/22/2020   TRIG 66 08/22/2020   CHOLHDL 3.3 08/22/2020   Lab Results  Component Value Date   WBC 4.5 09/21/2019   HGB 14.9 09/21/2019   HCT 44.4 09/21/2019   MCV 88.7 09/21/2019   PLT 220.0 09/21/2019   Lab Results  Component Value Date   IRON 61 03/20/2017    Attestation Statements:   Reviewed by clinician on day of visit: allergies, medications, problem list, medical history, surgical history, family history, social history, and previous encounter notes.  Coral Ceo, CMA, am acting as transcriptionist for Coralie Common, MD.   I have reviewed the above documentation for accuracy and completeness, and I agree with the above. - Jinny Blossom, MD

## 2020-11-23 ENCOUNTER — Other Ambulatory Visit: Payer: Self-pay | Admitting: Family

## 2020-11-23 ENCOUNTER — Encounter: Payer: Self-pay | Admitting: Family

## 2020-11-23 MED ORDER — MOLNUPIRAVIR EUA 200MG CAPSULE: 4.0000 | ORAL_CAPSULE | Freq: Two times a day (BID) | ORAL | 0 refills | Status: AC

## 2020-11-28 ENCOUNTER — Encounter (INDEPENDENT_AMBULATORY_CARE_PROVIDER_SITE_OTHER): Payer: Self-pay | Admitting: Family Medicine

## 2020-11-28 ENCOUNTER — Other Ambulatory Visit: Payer: Self-pay

## 2020-11-28 ENCOUNTER — Other Ambulatory Visit (INDEPENDENT_AMBULATORY_CARE_PROVIDER_SITE_OTHER): Payer: Self-pay | Admitting: Family Medicine

## 2020-11-28 ENCOUNTER — Telehealth (INDEPENDENT_AMBULATORY_CARE_PROVIDER_SITE_OTHER): Payer: BC Managed Care – PPO | Admitting: Family Medicine

## 2020-11-28 DIAGNOSIS — E8881 Metabolic syndrome: Secondary | ICD-10-CM

## 2020-11-28 DIAGNOSIS — Z6837 Body mass index (BMI) 37.0-37.9, adult: Secondary | ICD-10-CM

## 2020-11-28 DIAGNOSIS — U071 COVID-19: Secondary | ICD-10-CM | POA: Diagnosis not present

## 2020-11-28 MED ORDER — RYBELSUS 7 MG PO TABS: 7.0000 mg | ORAL_TABLET | Freq: Every day | ORAL | 0 refills | Status: DC

## 2020-11-30 NOTE — Progress Notes (Signed)
TeleHealth Visit:  Due to the COVID-19 pandemic, this visit was completed with telemedicine (audio/video) technology to reduce patient and provider exposure as well as to preserve personal protective equipment.   Avaley has verbally consented to this TeleHealth visit. The patient is located at home, the provider is located at the [facility] office. The participants in this visit include the listed provider and patient. The visit was conducted today via video.   Chief Complaint: OBESITY Tonya Taylor is here to discuss her progress with her obesity treatment plan along with follow-up of her obesity related diagnoses. Tonya Taylor is on the Category 3 Plan and states she is following her eating plan approximately 95% of the time. Tonya Taylor states she is walking 1 mile 5-6 times per week.  Today's visit was #: 33 Starting weight: 237 lbs Starting date: 09/30/2019  Interim History: Tonya Taylor has COVID x 6 days and is home in isolation. She had 1 day of mild symptoms of fatigue and URI symptoms. She went to [place] and did a good amount of walking. She realizes she may have over eaten when away. She reports no change in taste or smell with COVID. Pt wants to recommit to category 3.  Subjective:   1. Metabolic syndrome Tonya Taylor is on Rybelsus with no GI symptoms. Her last A1c was 5.7.  2. COVID She tested positive for COVID last week and is experiencing mild symptoms. She has Paxlovid x 5 days.  Assessment/Plan:   1. Metabolic syndrome Starting goal: Lose 7-10% of starting weight. She will continue to focus on protein-rich, low simple carbohydrate foods. We reviewed the importance of hydration, regular exercise for stress reduction, and restorative sleep.  We will continue to check lab work every 3 months, with 10% weight loss, or should any other concerns arise.  Refill- Semaglutide (RYBELSUS) 7 MG TABS; Take 7 mg by mouth daily.  Dispense: 30 tablet; Refill: 0  2. COVID Follow  up with PCP if symptoms worsen or persist. Pt instructed to wear mask for the next 5-7 days. We will continue to monitor.  Orders and follow up as documented in patient record.  Counseling COVID-19 is a respiratory infection that is caused by a virus. It can cause serious infections, such as pneumonia, acute respiratory distress syndrome, acute respiratory failure, or sepsis. You are more likely to develop a serious illness if you are 33 years of age or older, have a weak immune system, live in a nursing home, have chronic disease, or have obesity. Get vaccinated as soon as they are available to you.  For our most current information, please visit [email or web address]. Wash your hands often with soap and water for 20 seconds. If soap and water are not available, use alcohol-based hand sanitizer. Wear a face mask. Make sure your mask covers your nose and mouth. Maintain at least 6 feet distance from others when in public.  Get help right away if You have trouble breathing, chest pain, confusion, or other concerning symptoms.  3. Obesity with current BMI of 34.9  Tonya Taylor is currently in the action stage of change. As such, her goal is to continue with weight loss efforts. She has agreed to the Category 3 Plan.   Exercise goals: All adults should avoid inactivity. Some physical activity is better than none, and adults who participate in any amount of physical activity gain some health benefits.  Behavioral modification strategies: increasing lean protein intake, meal planning and cooking strategies, and keeping healthy foods in the home.  Tonya Taylor has agreed to follow-up with our clinic in 3 weeks. She was informed of the importance of frequent follow-up visits to maximize her success with intensive lifestyle modifications for her multiple health conditions.  Objective:   VITALS: Per patient if applicable, see vitals. GENERAL: Alert and in no acute distress. CARDIOPULMONARY: No  increased WOB. Speaking in clear sentences.  PSYCH: Pleasant and cooperative. Speech normal rate and rhythm. Affect is appropriate. Insight and judgement are appropriate. Attention is focused, linear, and appropriate.  NEURO: Oriented as arrived to appointment on time with no prompting.   Lab Results  Component Value Date   CREATININE 0.95 08/22/2020   BUN 24 08/22/2020   NA 139 08/22/2020   K 4.3 08/22/2020   CL 99 08/22/2020   CO2 24 08/22/2020   Lab Results  Component Value Date   ALT 31 08/22/2020   AST 26 08/22/2020   ALKPHOS 60 08/22/2020   BILITOT 0.3 08/22/2020   Lab Results  Component Value Date   HGBA1C 5.7 (H) 08/22/2020   HGBA1C 5.6 09/30/2019   HGBA1C 5.6 03/19/2016   HGBA1C 5.5 03/16/2015   HGBA1C 5.6 03/01/2014   Lab Results  Component Value Date   INSULIN 16.8 08/22/2020   INSULIN 21.5 09/30/2019   Lab Results  Component Value Date   TSH 3.19 09/21/2019   Lab Results  Component Value Date   CHOL 156 08/22/2020   HDL 47 08/22/2020   LDLCALC 96 08/22/2020   TRIG 66 08/22/2020   CHOLHDL 3.3 08/22/2020   Lab Results  Component Value Date   VD25OH 30.3 08/22/2020   VD25OH 55.07 09/21/2019   VD25OH 32.44 05/26/2018   Lab Results  Component Value Date   WBC 4.5 09/21/2019   HGB 14.9 09/21/2019   HCT 44.4 09/21/2019   MCV 88.7 09/21/2019   PLT 220.0 09/21/2019   Lab Results  Component Value Date   IRON 61 03/20/2017    Attestation Statements:   Reviewed by clinician on day of visit: allergies, medications, problem list, medical history, surgical history, family history, social history, and previous encounter notes.  Coral Ceo, CMA, am acting as transcriptionist for Coralie Common, MD.   I have reviewed the above documentation for accuracy and completeness, and I agree with the above. - Coralie Common, MD

## 2020-12-13 ENCOUNTER — Encounter (INDEPENDENT_AMBULATORY_CARE_PROVIDER_SITE_OTHER): Payer: Self-pay

## 2020-12-14 ENCOUNTER — Ambulatory Visit (INDEPENDENT_AMBULATORY_CARE_PROVIDER_SITE_OTHER): Payer: BC Managed Care – PPO | Admitting: Family Medicine

## 2020-12-14 ENCOUNTER — Encounter (INDEPENDENT_AMBULATORY_CARE_PROVIDER_SITE_OTHER): Payer: Self-pay | Admitting: Family Medicine

## 2020-12-14 ENCOUNTER — Other Ambulatory Visit: Payer: Self-pay

## 2020-12-14 VITALS — BP 148/75 | HR 72 | Temp 98.1°F | Ht 67.0 in | Wt 231.0 lb

## 2020-12-14 DIAGNOSIS — Z6834 Body mass index (BMI) 34.0-34.9, adult: Secondary | ICD-10-CM

## 2020-12-14 DIAGNOSIS — I10 Essential (primary) hypertension: Secondary | ICD-10-CM | POA: Diagnosis not present

## 2020-12-14 DIAGNOSIS — E8881 Metabolic syndrome: Secondary | ICD-10-CM

## 2020-12-14 DIAGNOSIS — Z9189 Other specified personal risk factors, not elsewhere classified: Secondary | ICD-10-CM | POA: Diagnosis not present

## 2020-12-14 DIAGNOSIS — E669 Obesity, unspecified: Secondary | ICD-10-CM | POA: Diagnosis not present

## 2020-12-14 MED ORDER — RYBELSUS 7 MG PO TABS: 7.0000 mg | ORAL_TABLET | Freq: Every day | ORAL | 0 refills | Status: DC

## 2020-12-14 NOTE — Patient Instructions (Signed)
Health Maintenance Due  Topic Date Due   COVID-19 Vaccine (4 - Booster for Pfizer series) 12/11/2020   INFLUENZA VACCINE  12/05/2020    Depression screen [facility] 2/9 10/13/2020 09/30/2019 08/21/2019  Decreased Interest 0 0 0  Down, Depressed, Hopeless 0 0 0  PHQ - 2 Score 0 0 0  Altered sleeping - 1 -  Tired, decreased energy - 1 -  Change in appetite - 1 -  Feeling bad or failure about yourself  - 0 -  Trouble concentrating - 0 -  Moving slowly or fidgety/restless - 0 -  Suicidal thoughts - 0 -  PHQ-9 Score - 3 -  Difficult doing work/chores - Not difficult at all -

## 2020-12-16 NOTE — Progress Notes (Signed)
Chief Complaint:   OBESITY Tonya Taylor is here to discuss her progress with her obesity treatment plan along with follow-up of her obesity related diagnoses. Tonya Taylor is on the Category 3 Plan and states she is following her eating plan approximately 90% of the time. Tonya Taylor states she is walking 15 to 20 minutes 2 times per week.  Today's visit was #: 20 Starting weight: 237 lbs Starting date: 09/30/2019 Today's weight: 231 lbs Today's date: 12/15/2020 Total lbs lost to date: 6 Total lbs lost since last in-office visit: 0  Interim History: Tonya Taylor realizes that she has perhaps been a bit too indulgent with her food choices and is also eating sporadically. She is skipping meals when she feels that she overeats or doesn't get all her food from the plan in. Edit also felt that she ate well when on her trip. She wants to decrease her snacking and increase control.  Subjective:   1. Metabolic syndrome Tonya Taylor is on Rybelsus with no GI side effects of the medication.   2. Essential hypertension Tonya Taylor's blood pressure was previously well controlled. Cardiovascular ROS: negative for - chest pain, chest pressure, or headache.  BP Readings from Last 3 Encounters:  12/14/20 (!) 148/75  10/31/20 124/80  10/13/20 122/80   Lab Results  Component Value Date   CREATININE 0.95 08/22/2020   CREATININE 0.96 09/21/2019   CREATININE 0.94 05/26/2018   3. At risk for diabetes mellitus Tonya Taylor is at higher than average risk for developing diabetes due to metabolic syndrome and obesity.    Assessment/Plan:   1. Metabolic syndrome Starting goal: Lose 7-10% of starting weight. She will continue to focus on protein-rich, low simple carbohydrate foods. We reviewed the importance of hydration, regular exercise for stress reduction, and restorative sleep.  We will continue to check lab work every 3 months, with 10% weight loss, or should any other concerns arise.  - Semaglutide (RYBELSUS)  7 MG TABS; Take 7 mg by mouth daily.  Dispense: 30 tablet; Refill: 0  2. Essential hypertension Tonya Taylor agrees to continue her current medications with no change in the dose. We will follow up her blood pressure at her next appointment.She is working on healthy weight loss and exercise to improve blood pressure control. We will watch for signs of hypotension as she continues her lifestyle modifications.  3. At risk for diabetes mellitus Tonya Taylor was given approximately 15 minutes of diabetes education and counseling today. We discussed intensive lifestyle modifications today with an emphasis on weight loss as well as increasing exercise and decreasing simple carbohydrates in her diet. We also reviewed medication options with an emphasis on risk versus benefit of those discussed.   Repetitive spaced learning was employed today to elicit superior memory formation and behavioral change.   4. Obesity with current BMI of 36.2 Tonya Taylor is currently in the action stage of change. As such, her goal is to continue with weight loss efforts. She has agreed to the Category 2 Plan.   Exercise goals: All adults should avoid inactivity. Some physical activity is better than none, and adults who participate in any amount of physical activity gain some health benefits.  Behavioral modification strategies: increasing lean protein intake, meal planning and cooking strategies, keeping healthy foods in the home, and planning for success.  Tonya Taylor has agreed to follow-up with our clinic in 3 weeks. She was informed of the importance of frequent follow-up visits to maximize her success with intensive lifestyle modifications for her multiple health conditions.  Objective:   Blood pressure (!) 148/75, pulse 72, temperature 98.1 F (36.7 C), height 5' 7"  (1.702 m), weight 231 lb (104.8 kg), SpO2 99 %. Body mass index is 36.18 kg/m.  General: Cooperative, alert, well developed, in no acute distress. HEENT:  Conjunctivae and lids unremarkable. Cardiovascular: Regular rhythm.  Lungs: Normal work of breathing. Neurologic: No focal deficits.   Lab Results  Component Value Date   CREATININE 0.95 08/22/2020   BUN 24 08/22/2020   NA 139 08/22/2020   K 4.3 08/22/2020   CL 99 08/22/2020   CO2 24 08/22/2020   Lab Results  Component Value Date   ALT 31 08/22/2020   AST 26 08/22/2020   ALKPHOS 60 08/22/2020   BILITOT 0.3 08/22/2020   Lab Results  Component Value Date   HGBA1C 5.7 (H) 08/22/2020   HGBA1C 5.6 09/30/2019   HGBA1C 5.6 03/19/2016   HGBA1C 5.5 03/16/2015   HGBA1C 5.6 03/01/2014   Lab Results  Component Value Date   INSULIN 16.8 08/22/2020   INSULIN 21.5 09/30/2019   Lab Results  Component Value Date   TSH 3.19 09/21/2019   Lab Results  Component Value Date   CHOL 156 08/22/2020   HDL 47 08/22/2020   LDLCALC 96 08/22/2020   TRIG 66 08/22/2020   CHOLHDL 3.3 08/22/2020   Lab Results  Component Value Date   VD25OH 30.3 08/22/2020   VD25OH 55.07 09/21/2019   VD25OH 32.44 05/26/2018   Lab Results  Component Value Date   WBC 4.5 09/21/2019   HGB 14.9 09/21/2019   HCT 44.4 09/21/2019   MCV 88.7 09/21/2019   PLT 220.0 09/21/2019   Lab Results  Component Value Date   IRON 61 03/20/2017   Attestation Statements:   Reviewed by clinician on day of visit: allergies, medications, problem list, medical history, surgical history, family history, social history, and previous encounter notes.  IMarcille Blanco, CMA, am acting as transcriptionist for Coralie Common, MD   I have reviewed the above documentation for accuracy and completeness, and I agree with the above. - Coralie Common, MD

## 2020-12-30 LAB — HM PAP SMEAR: HM Pap smear: NORMAL

## 2021-01-11 ENCOUNTER — Ambulatory Visit (INDEPENDENT_AMBULATORY_CARE_PROVIDER_SITE_OTHER): Payer: BC Managed Care – PPO | Admitting: Family Medicine

## 2021-01-11 ENCOUNTER — Encounter (INDEPENDENT_AMBULATORY_CARE_PROVIDER_SITE_OTHER): Payer: Self-pay | Admitting: Family Medicine

## 2021-01-11 ENCOUNTER — Other Ambulatory Visit: Payer: Self-pay

## 2021-01-11 VITALS — BP 146/87 | HR 68 | Temp 98.1°F | Ht 67.0 in | Wt 225.0 lb

## 2021-01-11 DIAGNOSIS — E8881 Metabolic syndrome: Secondary | ICD-10-CM | POA: Diagnosis not present

## 2021-01-11 DIAGNOSIS — Z9189 Other specified personal risk factors, not elsewhere classified: Secondary | ICD-10-CM | POA: Diagnosis not present

## 2021-01-11 DIAGNOSIS — I10 Essential (primary) hypertension: Secondary | ICD-10-CM

## 2021-01-11 DIAGNOSIS — Z6837 Body mass index (BMI) 37.0-37.9, adult: Secondary | ICD-10-CM

## 2021-01-11 MED ORDER — RYBELSUS 7 MG PO TABS: 7.0000 mg | ORAL_TABLET | Freq: Every day | ORAL | 0 refills | Status: DC

## 2021-01-12 NOTE — Progress Notes (Signed)
Chief Complaint:   OBESITY Tonya Taylor is here to discuss her progress with her obesity treatment plan along with follow-up of her obesity related diagnoses. Tonya Taylor is on the Category 2 Plan and states she is following her eating plan approximately 90% of the time. Tonya Taylor states she is walking 15-20 minutes 5 times per week.  Today's visit was #: 21 Starting weight: 237 lbs Starting date: 09/30/2019 Today's weight: 225 lbs Today's date: 01/11/2021 Total lbs lost to date: 12 Total lbs lost since last in-office visit: 6  Interim History: Tonya Taylor has had a difficult last few weeks and has been eating some indulgent food. She realizes she has been eating more than she should in terms of indulgence. She reports some erratic timing to her eating. Pt is not eating all food on plan and is often skipping lunch.  Subjective:   1. Essential hypertension BP slightly elevated today. Pt denies chest pain/chest pressure/headache. She is on aldactone and Micardis Hct.  2. Metabolic syndrome Tonya Taylor is on Rybelsus. Her last A1c was 5.7 and LDL 96.  3. At risk for deficient intake of food Tonya Taylor is at risk for deficient intake of food due to skipping lunch almost daily.  Assessment/Plan:   1. Essential hypertension Tonya Taylor is working on healthy weight loss and exercise to improve blood pressure control. We will watch for signs of hypotension as she continues her lifestyle modifications. Continue current meds with no change in dose at this time.  2. Metabolic syndrome Starting goal: Lose 7-10% of starting weight. She will continue to focus on protein-rich, low simple carbohydrate foods. We reviewed the importance of hydration, regular exercise for stress reduction, and restorative sleep.  We will continue to check lab work every 3 months, with 10% weight loss, or should any other concerns arise.  Refill- Semaglutide (RYBELSUS) 7 MG TABS; Take 7 mg by mouth daily.  Dispense: 30 tablet;  Refill: 0  3. At risk for deficient intake of food Tonya Taylor was given approximately 15 minutes of deficit intake of food prevention counseling today. Tonya Taylor is at risk for eating too few calories based on current food recall. She was encouraged to focus on meeting caloric and protein goals according to her recommended meal plan.   4. Obesity with current BMI of 35.4  Tonya Taylor is currently in the action stage of change. As such, her goal is to continue with weight loss efforts. She has agreed to the Category 3 Plan.   Exercise goals:  As is  Behavioral modification strategies: increasing lean protein intake, no skipping meals, meal planning and cooking strategies, and keeping healthy foods in the home.  Tonya Taylor has agreed to follow-up with our clinic in 3 weeks. She was informed of the importance of frequent follow-up visits to maximize her success with intensive lifestyle modifications for her multiple health conditions.   Objective:   Blood pressure (!) 146/87, pulse 68, temperature 98.1 F (36.7 C), height 5' 7"  (1.702 m), weight 225 lb (102.1 kg), SpO2 98 %. Body mass index is 35.24 kg/m.  General: Cooperative, alert, well developed, in no acute distress. HEENT: Conjunctivae and lids unremarkable. Cardiovascular: Regular rhythm.  Lungs: Normal work of breathing. Neurologic: No focal deficits.   Lab Results  Component Value Date   CREATININE 0.95 08/22/2020   BUN 24 08/22/2020   NA 139 08/22/2020   K 4.3 08/22/2020   CL 99 08/22/2020   CO2 24 08/22/2020   Lab Results  Component Value Date   ALT  31 08/22/2020   AST 26 08/22/2020   ALKPHOS 60 08/22/2020   BILITOT 0.3 08/22/2020   Lab Results  Component Value Date   HGBA1C 5.7 (H) 08/22/2020   HGBA1C 5.6 09/30/2019   HGBA1C 5.6 03/19/2016   HGBA1C 5.5 03/16/2015   HGBA1C 5.6 03/01/2014   Lab Results  Component Value Date   INSULIN 16.8 08/22/2020   INSULIN 21.5 09/30/2019   Lab Results  Component Value Date    TSH 3.19 09/21/2019   Lab Results  Component Value Date   CHOL 156 08/22/2020   HDL 47 08/22/2020   LDLCALC 96 08/22/2020   TRIG 66 08/22/2020   CHOLHDL 3.3 08/22/2020   Lab Results  Component Value Date   VD25OH 30.3 08/22/2020   VD25OH 55.07 09/21/2019   VD25OH 32.44 05/26/2018   Lab Results  Component Value Date   WBC 4.5 09/21/2019   HGB 14.9 09/21/2019   HCT 44.4 09/21/2019   MCV 88.7 09/21/2019   PLT 220.0 09/21/2019   Lab Results  Component Value Date   IRON 61 03/20/2017   Attestation Statements:   Reviewed by clinician on day of visit: allergies, medications, problem list, medical history, surgical history, family history, social history, and previous encounter notes.  Coral Ceo, CMA, am acting as transcriptionist for Coralie Common, MD.   I have reviewed the above documentation for accuracy and completeness, and I agree with the above. - Coralie Common, MD

## 2021-02-02 ENCOUNTER — Other Ambulatory Visit: Payer: Self-pay

## 2021-02-02 ENCOUNTER — Encounter (INDEPENDENT_AMBULATORY_CARE_PROVIDER_SITE_OTHER): Payer: Self-pay | Admitting: Family Medicine

## 2021-02-02 ENCOUNTER — Ambulatory Visit (INDEPENDENT_AMBULATORY_CARE_PROVIDER_SITE_OTHER): Payer: BC Managed Care – PPO | Admitting: Family Medicine

## 2021-02-02 VITALS — BP 132/80 | HR 74 | Temp 98.2°F | Ht 67.0 in | Wt 222.0 lb

## 2021-02-02 DIAGNOSIS — Z6837 Body mass index (BMI) 37.0-37.9, adult: Secondary | ICD-10-CM

## 2021-02-02 DIAGNOSIS — E8881 Metabolic syndrome: Secondary | ICD-10-CM

## 2021-02-02 DIAGNOSIS — E559 Vitamin D deficiency, unspecified: Secondary | ICD-10-CM

## 2021-02-02 MED ORDER — RYBELSUS 7 MG PO TABS: 7.0000 mg | ORAL_TABLET | Freq: Every day | ORAL | 0 refills | Status: DC

## 2021-02-02 NOTE — Progress Notes (Signed)
Chief Complaint:   OBESITY Tonya Taylor is here to discuss her progress with her obesity treatment plan along with follow-up of her obesity related diagnoses. Tonya Taylor is on the Category 3 Plan and states she is following her eating plan approximately 90% of the time. Tonya Taylor states she is walking 15 minutes 5 times per week.  Today's visit was #: 4 Starting weight: 237 lbs Starting date: 09/30/2019 Today's weight: 222 lbs Today's date: 02/02/2021 Total lbs lost to date: 15 Total lbs lost since last in-office visit: 3  Interim History: Tonya Taylor voices she has limited her indulgent eating to decrease the frequency. She has had pizza twice. She is stressed and tired from work. 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 She is getting the rest of the food on plan in. She reports minimal occasional hunger. Pt is doing popcorn and almonds.  Subjective:   1. Metabolic syndrome Tonya Taylor is on Rybelsus and denies GI side effects. Her last A1c was 5.7.  2. Vitamin D deficiency Pt is taking OTC Vit D 2,000 IU daily. Her last Vit D level was 30.3.  Assessment/Plan:   1. Metabolic syndrome Pt will continue Rybelsus 7 mg as directed.   Refill- Semaglutide (RYBELSUS) 7 MG TABS; Take 7 mg by mouth daily.  Dispense: 30 tablet; Refill: 0  2. Vitamin D deficiency Low Vitamin D level contributes to fatigue and are associated with obesity, breast, and colon cancer. She agrees to continue to take OTC Vitamin D 2,000 IU daily and will follow-up for routine testing of Vitamin D, at least 2-3 times per year to avoid over-replacement.  3. Obesity with current BMI of 34.9  Tonya Taylor is currently in the action stage of change. As such, her goal is to continue with weight loss efforts. She has agreed to the Category 3 Plan.   Exercise goals:  As is  Behavioral modification strategies: increasing lean protein intake, meal planning and cooking  strategies, keeping healthy foods in the home, and planning for success.  Tonya Taylor has agreed to follow-up with our clinic in 3-4 weeks. She was informed of the importance of frequent follow-up visits to maximize her success with intensive lifestyle modifications for her multiple health conditions.   Objective:   Blood pressure 132/80, pulse 74, temperature 98.2 F (36.8 C), height 5' 7"  (1.702 m), weight 222 lb (100.7 kg), SpO2 99 %. Body mass index is 34.77 kg/m.  General: Cooperative, alert, well developed, in no acute distress. HEENT: Conjunctivae and lids unremarkable. Cardiovascular: Regular rhythm.  Lungs: Normal work of breathing. Neurologic: No focal deficits.   Lab Results  Component Value Date   CREATININE 0.95 08/22/2020   BUN 24 08/22/2020   NA 139 08/22/2020   K 4.3 08/22/2020   CL 99 08/22/2020   CO2 24 08/22/2020   Lab Results  Component Value Date   ALT 31 08/22/2020   AST 26 08/22/2020   ALKPHOS 60 08/22/2020   BILITOT 0.3 08/22/2020   Lab Results  Component Value Date   HGBA1C 5.7 (H) 08/22/2020   HGBA1C 5.6 09/30/2019   HGBA1C 5.6 03/19/2016   HGBA1C 5.5 03/16/2015   HGBA1C 5.6 03/01/2014   Lab Results  Component Value Date   INSULIN 16.8 08/22/2020   INSULIN 21.5 09/30/2019   Lab Results  Component Value Date   TSH 3.19 09/21/2019   Lab Results  Component Value Date   CHOL 156 08/22/2020   HDL  47 08/22/2020   LDLCALC 96 08/22/2020   TRIG 66 08/22/2020   CHOLHDL 3.3 08/22/2020   Lab Results  Component Value Date   VD25OH 30.3 08/22/2020   VD25OH 55.07 09/21/2019   VD25OH 32.44 05/26/2018   Lab Results  Component Value Date   WBC 4.5 09/21/2019   HGB 14.9 09/21/2019   HCT 44.4 09/21/2019   MCV 88.7 09/21/2019   PLT 220.0 09/21/2019   Lab Results  Component Value Date   IRON 61 03/20/2017    Attestation Statements:   Reviewed by clinician on day of visit: allergies, medications, problem list, medical history, surgical  history, family history, social history, and previous encounter notes.  Time spent on visit including pre-visit chart review and post-visit care and charting was 30 minutes.   Coral Ceo, CMA, am acting as transcriptionist for Coralie Common, MD.   I have reviewed the above documentation for accuracy and completeness, and I agree with the above. - Coralie Common, MD

## 2021-02-03 ENCOUNTER — Ambulatory Visit: Payer: BC Managed Care – PPO

## 2021-02-03 ENCOUNTER — Ambulatory Visit: Payer: BC Managed Care – PPO | Attending: Internal Medicine

## 2021-02-03 ENCOUNTER — Other Ambulatory Visit (HOSPITAL_BASED_OUTPATIENT_CLINIC_OR_DEPARTMENT_OTHER): Payer: Self-pay

## 2021-02-03 DIAGNOSIS — Z23 Encounter for immunization: Secondary | ICD-10-CM

## 2021-02-03 MED ORDER — INFLUENZA VAC SPLIT QUAD 0.5 ML IM SUSY
PREFILLED_SYRINGE | INTRAMUSCULAR | 0 refills | Status: DC
  Filled 2021-02-03: qty 0.5, 1d supply, fill #0

## 2021-02-03 NOTE — Progress Notes (Signed)
   Covid-19 Vaccination Clinic  Name:  Tonya Taylor    MRN: [id number] DOB: 06-03-1955  02/03/2021  Ms. Tonya Taylor was observed post Covid-19 immunization for 15 minutes without incident. She was provided with Vaccine Information Sheet and instruction to access the V-Safe system.   Ms. Tonya Taylor was instructed to call 911 with any severe reactions post vaccine: Difficulty breathing  Swelling of face and throat  A fast heartbeat  A bad rash all over body  Dizziness and weakness

## 2021-02-14 ENCOUNTER — Other Ambulatory Visit (HOSPITAL_BASED_OUTPATIENT_CLINIC_OR_DEPARTMENT_OTHER): Payer: Self-pay

## 2021-02-14 MED ORDER — COVID-19MRNA BIVAL VACC PFIZER 30 MCG/0.3ML IM SUSP
INTRAMUSCULAR | 0 refills | Status: DC
  Filled 2021-02-14: qty 0.3, 1d supply, fill #0

## 2021-02-28 ENCOUNTER — Ambulatory Visit (INDEPENDENT_AMBULATORY_CARE_PROVIDER_SITE_OTHER): Payer: BC Managed Care – PPO | Admitting: Family Medicine

## 2021-02-28 ENCOUNTER — Other Ambulatory Visit: Payer: Self-pay

## 2021-02-28 ENCOUNTER — Encounter (INDEPENDENT_AMBULATORY_CARE_PROVIDER_SITE_OTHER): Payer: Self-pay | Admitting: Family Medicine

## 2021-02-28 VITALS — BP 113/73 | HR 71 | Temp 98.1°F | Ht 67.0 in | Wt 220.0 lb

## 2021-02-28 DIAGNOSIS — Z6837 Body mass index (BMI) 37.0-37.9, adult: Secondary | ICD-10-CM | POA: Diagnosis not present

## 2021-02-28 DIAGNOSIS — R7303 Prediabetes: Secondary | ICD-10-CM

## 2021-03-01 NOTE — Progress Notes (Signed)
Chief Complaint:   OBESITY Tonya Taylor is here to discuss her progress with her obesity treatment plan along with follow-up of her obesity related diagnoses. Tonya Taylor is on the Category 3 Plan and states she is following her eating plan approximately 90-95% of the time. Tonya Taylor states she is walking 15-20 minutes 4-5 times per week.  Today's visit was #: 23 Starting weight: 237 lbs Starting date: 09/30/2019 Today's weight: 220 lbs Today's date: 02/28/2021 Total lbs lost to date: 17 Total lbs lost since last in-office visit: 2  Interim History: Tonya Taylor has been feeling tired and is anticipating Thanksgiving and Christmas. She is still deciding what she will do for the holidays. She has been sticking with category 3 as closely as she can. She sticks to breakfast and lunch and is picking up something from [employer] in the afternoon if she is tired. Pt would like to continue current plan.  Subjective:   1. Pre-diabetes Pt's last A1c was 5.7 and insulin level 16.8. She is on GLP-1 therapy.  Assessment/Plan:   1. Pre-diabetes Tonya Taylor will continue to work on weight loss, exercise, and decreasing simple carbohydrates to help decrease the risk of diabetes. Continue GLP-1 therapy.  2. Obesity with current BMI of 34.5  Tonya Taylor is currently in the action stage of change. As such, her goal is to continue with weight loss efforts. She has agreed to the Category 3 Plan.   Exercise goals:  As is and add in resistance training 2-3 times a week.  Behavioral modification strategies: increasing lean protein intake, meal planning and cooking strategies, and keeping healthy foods in the home.  Tonya Taylor has agreed to follow-up with our clinic in 4 weeks. She was informed of the importance of frequent follow-up visits to maximize her success with intensive lifestyle modifications for her multiple health conditions.   Objective:   Blood pressure 113/73, pulse 71, temperature 98.1 F (36.7 C),  height 5' 7"  (1.702 m), weight 220 lb (99.8 kg), SpO2 99 %. Body mass index is 34.46 kg/m.  General: Cooperative, alert, well developed, in no acute distress. HEENT: Conjunctivae and lids unremarkable. Cardiovascular: Regular rhythm.  Lungs: Normal work of breathing. Neurologic: No focal deficits.   Lab Results  Component Value Date   CREATININE 0.95 08/22/2020   BUN 24 08/22/2020   NA 139 08/22/2020   K 4.3 08/22/2020   CL 99 08/22/2020   CO2 24 08/22/2020   Lab Results  Component Value Date   ALT 31 08/22/2020   AST 26 08/22/2020   ALKPHOS 60 08/22/2020   BILITOT 0.3 08/22/2020   Lab Results  Component Value Date   HGBA1C 5.7 (H) 08/22/2020   HGBA1C 5.6 09/30/2019   HGBA1C 5.6 03/19/2016   HGBA1C 5.5 03/16/2015   HGBA1C 5.6 03/01/2014   Lab Results  Component Value Date   INSULIN 16.8 08/22/2020   INSULIN 21.5 09/30/2019   Lab Results  Component Value Date   TSH 3.19 09/21/2019   Lab Results  Component Value Date   CHOL 156 08/22/2020   HDL 47 08/22/2020   LDLCALC 96 08/22/2020   TRIG 66 08/22/2020   CHOLHDL 3.3 08/22/2020   Lab Results  Component Value Date   VD25OH 30.3 08/22/2020   VD25OH 55.07 09/21/2019   VD25OH 32.44 05/26/2018   Lab Results  Component Value Date   WBC 4.5 09/21/2019   HGB 14.9 09/21/2019   HCT 44.4 09/21/2019   MCV 88.7 09/21/2019   PLT 220.0 09/21/2019   Lab  Results  Component Value Date   IRON 61 03/20/2017   Attestation Statements:   Reviewed by clinician on day of visit: allergies, medications, problem list, medical history, surgical history, family history, social history, and previous encounter notes.  Coral Ceo, CMA, am acting as transcriptionist for Coralie Common, MD.  I have reviewed the above documentation for accuracy and completeness, and I agree with the above. - Coralie Common, MD

## 2021-03-26 ENCOUNTER — Other Ambulatory Visit (INDEPENDENT_AMBULATORY_CARE_PROVIDER_SITE_OTHER): Payer: Self-pay | Admitting: Family Medicine

## 2021-03-26 DIAGNOSIS — E8881 Metabolic syndrome: Secondary | ICD-10-CM

## 2021-03-28 ENCOUNTER — Ambulatory Visit (INDEPENDENT_AMBULATORY_CARE_PROVIDER_SITE_OTHER): Payer: BC Managed Care – PPO | Admitting: Family Medicine

## 2021-03-28 ENCOUNTER — Other Ambulatory Visit: Payer: Self-pay

## 2021-03-28 ENCOUNTER — Encounter (INDEPENDENT_AMBULATORY_CARE_PROVIDER_SITE_OTHER): Payer: Self-pay | Admitting: Family Medicine

## 2021-03-28 VITALS — BP 116/65 | HR 76 | Temp 98.3°F | Ht 67.0 in | Wt 221.0 lb

## 2021-03-28 DIAGNOSIS — I10 Essential (primary) hypertension: Secondary | ICD-10-CM | POA: Diagnosis not present

## 2021-03-28 DIAGNOSIS — R7303 Prediabetes: Secondary | ICD-10-CM

## 2021-03-28 DIAGNOSIS — Z6837 Body mass index (BMI) 37.0-37.9, adult: Secondary | ICD-10-CM

## 2021-03-28 DIAGNOSIS — E7849 Other hyperlipidemia: Secondary | ICD-10-CM | POA: Diagnosis not present

## 2021-03-28 MED ORDER — RYBELSUS 7 MG PO TABS: 7.0000 mg | ORAL_TABLET | Freq: Every day | ORAL | 0 refills | Status: DC

## 2021-04-03 NOTE — Progress Notes (Addendum)
Chief Complaint:   OBESITY Tonya Taylor is here to discuss her progress with her obesity treatment plan along with follow-up of her obesity related diagnoses. Tonya Taylor is on the Category 3 Plan and states she is following her eating plan approximately 90% of the time. Tonya Taylor states she is walking 15-20 minutes 5 times per week.  Today's visit was #: 24 Starting weight: 237 lbs Starting date: 09/30/2019 Today's weight: 221 lbs Today's date: 03/28/2021 Total lbs lost to date: 16 Total lbs lost since last in-office visit: 0  Interim History: Tonya Taylor has fallen off and is eating mor peanut brittle and had pies today. Breakfast and lunch ar good. She eats late at night- mostly [employer] salads. She is cooking at home for the holidays and having small family gathering. Christmas family meeting. Pt is tolerating Rybelsus well.  Subjective:   1. Pre-diabetes Tonya Taylor's last A1c was 5.7 and insulin level 16.8. She is tolerating GLP-1. Pt reports increased snacking while eating out during the holiday season.  2. Essential hypertension BP well controlled today.  3. Other hyperlipidemia Pt is on statin therapy and denies myalgias. Her last LDL was 96 and at goal of <100.  Assessment/Plan:   1. Pre-diabetes Tonya Taylor will continue to work on weight loss, exercise, and decreasing simple carbohydrates to help decrease the risk of diabetes.  Discussed holiday meal and increasing protein to decrease snacking while out. Repeat labs at next OV.   Refill- Semaglutide (RYBELSUS) 7 MG TABS; Take 7 mg by mouth daily.  Dispense: 30 tablet; Refill: 0  2. Essential hypertension Tonya Taylor is working on healthy weight loss and exercise to improve blood pressure control. We will watch for signs of hypotension as she continues her lifestyle modifications. Continue current treatment plan.  3. Other hyperlipidemia Cardiovascular risk and specific lipid/LDL goals reviewed.  We discussed several lifestyle  modifications today and Tonya Taylor will continue to work on diet, exercise and weight loss efforts. Orders and follow up as documented in patient record. Continue statin therapy and repeat labs in 1 month.  Counseling Intensive lifestyle modifications are the first line treatment for this issue. Dietary changes: Increase soluble fiber. Decrease simple carbohydrates. Exercise changes: Moderate to vigorous-intensity aerobic activity 150 minutes per week if tolerated. Lipid-lowering medications: see documented in medical record.  4. Obesity with current BMI of 34.7  Tonya Taylor is currently in the action stage of change. As such, her goal is to continue with weight loss efforts. She has agreed to the Category 3 Plan.   Exercise goals:  As is  Behavioral modification strategies: no skipping meals, holiday eating strategies , and avoiding temptations.  Tonya Taylor has agreed to follow-up with our clinic in 3-4 weeks. She was informed of the importance of frequent follow-up visits to maximize her success with intensive lifestyle modifications for her multiple health conditions.   Objective:   Blood pressure 116/65, pulse 76, temperature 98.3 F (36.8 C), height 5' 7"  (1.702 m), weight 221 lb (100.2 kg), SpO2 98 %. Body mass index is 34.61 kg/m.  General: Cooperative, alert, well developed, in no acute distress. HEENT: Conjunctivae and lids unremarkable. Cardiovascular: Regular rhythm.  Lungs: Normal work of breathing. Neurologic: No focal deficits.   Lab Results  Component Value Date   CREATININE 0.95 08/22/2020   BUN 24 08/22/2020   NA 139 08/22/2020   K 4.3 08/22/2020   CL 99 08/22/2020   CO2 24 08/22/2020   Lab Results  Component Value Date   ALT 31 08/22/2020  AST 26 08/22/2020   ALKPHOS 60 08/22/2020   BILITOT 0.3 08/22/2020   Lab Results  Component Value Date   HGBA1C 5.7 (H) 08/22/2020   HGBA1C 5.6 09/30/2019   HGBA1C 5.6 03/19/2016   HGBA1C 5.5 03/16/2015   HGBA1C 5.6  03/01/2014   Lab Results  Component Value Date   INSULIN 16.8 08/22/2020   INSULIN 21.5 09/30/2019   Lab Results  Component Value Date   TSH 3.19 09/21/2019   Lab Results  Component Value Date   CHOL 156 08/22/2020   HDL 47 08/22/2020   LDLCALC 96 08/22/2020   TRIG 66 08/22/2020   CHOLHDL 3.3 08/22/2020   Lab Results  Component Value Date   VD25OH 30.3 08/22/2020   VD25OH 55.07 09/21/2019   VD25OH 32.44 05/26/2018   Lab Results  Component Value Date   WBC 4.5 09/21/2019   HGB 14.9 09/21/2019   HCT 44.4 09/21/2019   MCV 88.7 09/21/2019   PLT 220.0 09/21/2019   Lab Results  Component Value Date   IRON 61 03/20/2017    Attestation Statements:   Reviewed by clinician on day of visit: allergies, medications, problem list, medical history, surgical history, family history, social history, and previous encounter notes.  Coral Ceo, CMA, am acting as transcriptionist for Coralie Common, MD.  I supervised resident physician Carollee Leitz during entirety of this visit and agree with her assessment and plan. I have reviewed the above documentation for accuracy and completeness, and I agree with the above. - Coralie Common, MD

## 2021-04-24 ENCOUNTER — Ambulatory Visit (INDEPENDENT_AMBULATORY_CARE_PROVIDER_SITE_OTHER): Payer: BC Managed Care – PPO | Admitting: Family Medicine

## 2021-04-24 ENCOUNTER — Encounter (INDEPENDENT_AMBULATORY_CARE_PROVIDER_SITE_OTHER): Payer: Self-pay | Admitting: Family Medicine

## 2021-04-24 ENCOUNTER — Other Ambulatory Visit: Payer: Self-pay

## 2021-04-24 VITALS — BP 115/76 | HR 65 | Temp 97.9°F | Ht 67.0 in | Wt 220.0 lb

## 2021-04-24 DIAGNOSIS — E7849 Other hyperlipidemia: Secondary | ICD-10-CM

## 2021-04-24 DIAGNOSIS — E559 Vitamin D deficiency, unspecified: Secondary | ICD-10-CM | POA: Diagnosis not present

## 2021-04-24 DIAGNOSIS — I10 Essential (primary) hypertension: Secondary | ICD-10-CM | POA: Diagnosis not present

## 2021-04-24 DIAGNOSIS — R7303 Prediabetes: Secondary | ICD-10-CM | POA: Diagnosis not present

## 2021-04-24 DIAGNOSIS — Z6837 Body mass index (BMI) 37.0-37.9, adult: Secondary | ICD-10-CM

## 2021-04-24 DIAGNOSIS — E66812 Obesity, class 2: Secondary | ICD-10-CM

## 2021-04-25 NOTE — Progress Notes (Signed)
Chief Complaint:   OBESITY Tonya Taylor is here to discuss her progress with her obesity treatment plan along with follow-up of her obesity related diagnoses. Tonya Taylor is on the Category 3 Plan and states she is following her eating plan approximately 95% of the time. Tonya Taylor states she is walking 15-20 minutes 4-5 times per week.  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 She is trying to stick to breakfast, lunch, and then struggles with afternoon and evening. Pt is going to be home now and can try cooking. She will be local for the holiday.  Subjective:   1. Vitamin D deficiency Pt denies nausea, vomiting, and muscle weakness but notes fatigue. She is on OTC Vit D 2,000 IU daily.  2. Prediabetes Pt's last A1c was 5.7 with an insulin level of 16.8. She is on Rybelsus with no GI side effects.  3. Other hyperlipidemia Pt has an LDL of 118, HDL 47, and triglycerides 66. She is on Zocor.  4. Essential hypertension BP very well controlled. Pt denies chest pain/chest pressure/headache. She is on Micardis HCT.  Assessment/Plan:   1. Vitamin D deficiency Low Vitamin D level contributes to fatigue and are associated with obesity, breast, and colon cancer. She agrees to continue to take OTC Vitamin D 2,000 IU daily and will follow-up for routine testing of Vitamin D, at least 2-3 times per year to avoid over-replacement. Check labs today.  - VITAMIN D 25 Hydroxy (Vit-D Deficiency, Fractures)  2. Prediabetes Tonya Taylor will continue to work on weight loss, exercise, and decreasing simple carbohydrates to help decrease the risk of diabetes. Check labs today. Continue Rybelsus.  - Hemoglobin  A1c - Insulin, random  3. Other hyperlipidemia Cardiovascular risk and specific lipid/LDL goals reviewed.  We discussed several lifestyle modifications today and Tonya Taylor will continue to work on diet, exercise and weight loss efforts. Orders and follow up as documented in patient record.   Counseling Intensive lifestyle modifications are the first line treatment for this issue. Dietary changes: Increase soluble fiber. Decrease simple carbohydrates. Exercise changes: Moderate to vigorous-intensity aerobic activity 150 minutes per week if tolerated. Lipid-lowering medications: see documented in medical record. Check labs today.  - Lipid Panel With LDL/HDL Ratio  4. Essential hypertension Tonya Taylor is working on healthy weight loss and exercise to improve blood pressure control. We will watch for signs of hypotension as she continues her lifestyle modifications. Check labs today. Continue current treatment plan.  - Comprehensive metabolic panel  5. Obesity with current BMI of 34.5  Tonya Taylor is currently in the action stage of change. As such, her goal is to continue with weight loss efforts. She has agreed to the Category 3 Plan.   Exercise goals: All adults should avoid inactivity. Some physical activity is better than none, and adults who participate in any amount of physical activity gain some health benefits.  Behavioral modification strategies: increasing lean protein intake, meal planning and cooking strategies, keeping healthy foods in the home, better snacking choices, holiday eating strategies , and planning for success.  Tonya Taylor has agreed to follow-up with our clinic in 3-4 weeks. She was informed of the importance of frequent follow-up visits to maximize her success with intensive  lifestyle modifications for her multiple health conditions.   Tonya Taylor was informed we would discuss her lab results at her next visit unless there is a critical issue that needs to be addressed  sooner. New Tonya Taylor agreed to keep her next visit at the agreed upon time to discuss these results.  Objective:   Blood pressure 115/76, pulse 65, temperature 97.9 F (36.6 C), height 5' 7"  (1.702 m), weight 220 lb (99.8 kg), SpO2 99 %. Body mass index is 34.46 kg/m.  General: Cooperative, alert, well developed, in no acute distress. HEENT: Conjunctivae and lids unremarkable. Cardiovascular: Regular rhythm.  Lungs: Normal work of breathing. Neurologic: No focal deficits.   Lab Results  Component Value Date   CREATININE 0.95 08/22/2020   BUN 24 08/22/2020   NA 139 08/22/2020   K 4.3 08/22/2020   CL 99 08/22/2020   CO2 24 08/22/2020   Lab Results  Component Value Date   ALT 31 08/22/2020   AST 26 08/22/2020   ALKPHOS 60 08/22/2020   BILITOT 0.3 08/22/2020   Lab Results  Component Value Date   HGBA1C 5.7 (H) 08/22/2020   HGBA1C 5.6 09/30/2019   HGBA1C 5.6 03/19/2016   HGBA1C 5.5 03/16/2015   HGBA1C 5.6 03/01/2014   Lab Results  Component Value Date   INSULIN 16.8 08/22/2020   INSULIN 21.5 09/30/2019   Lab Results  Component Value Date   TSH 3.19 09/21/2019   Lab Results  Component Value Date   CHOL 156 08/22/2020   HDL 47 08/22/2020   LDLCALC 96 08/22/2020   TRIG 66 08/22/2020   CHOLHDL 3.3 08/22/2020   Lab Results  Component Value Date   VD25OH 30.3 08/22/2020   VD25OH 55.07 09/21/2019   VD25OH 32.44 05/26/2018   Lab Results  Component Value Date   WBC 4.5 09/21/2019   HGB 14.9 09/21/2019   HCT 44.4 09/21/2019   MCV 88.7 09/21/2019   PLT 220.0 09/21/2019   Lab Results  Component Value Date   IRON 61 03/20/2017    Attestation Statements:   Reviewed by clinician on day of visit: allergies, medications, problem list, medical history, surgical history, family history, social history, and previous encounter notes.  Coral Ceo, CMA, am acting as transcriptionist for Coralie Common, MD.   I have reviewed the above documentation for  accuracy and completeness, and I agree with the above. - Coralie Common, MD

## 2021-04-26 LAB — COMPREHENSIVE METABOLIC PANEL
ALT: 21 IU/L (ref 0–32)
AST: 19 IU/L (ref 0–40)
Albumin/Globulin Ratio: 1.1 — ABNORMAL LOW (ref 1.2–2.2)
Albumin: 4 g/dL (ref 3.8–4.8)
Alkaline Phosphatase: 58 IU/L (ref 44–121)
BUN/Creatinine Ratio: 20 (ref 12–28)
BUN: 21 mg/dL (ref 8–27)
Bilirubin Total: 0.4 mg/dL (ref 0.0–1.2)
CO2: 24 mmol/L (ref 20–29)
Calcium: 9.3 mg/dL (ref 8.7–10.3)
Chloride: 101 mmol/L (ref 96–106)
Creatinine, Ser: 1.03 mg/dL — ABNORMAL HIGH (ref 0.57–1.00)
Globulin, Total: 3.5 g/dL (ref 1.5–4.5)
Glucose: 74 mg/dL (ref 70–99)
Potassium: 4.4 mmol/L (ref 3.5–5.2)
Sodium: 140 mmol/L (ref 134–144)
Total Protein: 7.5 g/dL (ref 6.0–8.5)
eGFR: 61 mL/min/{1.73_m2} (ref >59)

## 2021-04-26 LAB — VITAMIN D 25 HYDROXY (VIT D DEFICIENCY, FRACTURES): Vit D, 25-Hydroxy: 34.6 ng/mL (ref 30.0–100.0)

## 2021-04-26 LAB — LIPID PANEL WITH LDL/HDL RATIO
Cholesterol, Total: 196 mg/dL (ref 100–199)
HDL: 45 mg/dL (ref >39)
LDL Chol Calc (NIH): 134 mg/dL — ABNORMAL HIGH (ref 0–99)
LDL/HDL Ratio: 3 ratio (ref 0.0–3.2)
Triglycerides: 94 mg/dL (ref 0–149)
VLDL Cholesterol Cal: 17 mg/dL (ref 5–40)

## 2021-04-26 LAB — HEMOGLOBIN A1C
Est. average glucose Bld gHb Est-mCnc: 114 mg/dL
Hgb A1c MFr Bld: 5.6 % (ref 4.8–5.6)

## 2021-04-26 LAB — INSULIN, RANDOM: INSULIN: 25.4 u[IU]/mL — ABNORMAL HIGH (ref 2.6–24.9)

## 2021-05-02 ENCOUNTER — Other Ambulatory Visit (INDEPENDENT_AMBULATORY_CARE_PROVIDER_SITE_OTHER): Payer: Self-pay | Admitting: Family Medicine

## 2021-05-02 DIAGNOSIS — R7303 Prediabetes: Secondary | ICD-10-CM

## 2021-05-06 ENCOUNTER — Other Ambulatory Visit (INDEPENDENT_AMBULATORY_CARE_PROVIDER_SITE_OTHER): Payer: Self-pay | Admitting: Family Medicine

## 2021-05-06 DIAGNOSIS — R7303 Prediabetes: Secondary | ICD-10-CM

## 2021-05-09 MED ORDER — RYBELSUS 7 MG PO TABS: 7.0000 mg | ORAL_TABLET | Freq: Every day | ORAL | 0 refills | Status: DC

## 2021-05-09 NOTE — Telephone Encounter (Signed)
Dr.Ukleja 

## 2021-05-09 NOTE — Telephone Encounter (Signed)
LAST APPOINTMENT DATE: 04/24/21 NEXT APPOINTMENT DATE: 06/06/20   [id number] - [place], [place] - [address] [place] [address] Phone: [phone] Fax: [phone]  [place], [place] [address] [place] [address] Phone: [phone] Fax: [phone]  [facility] - [place], [place] [address] [place] [address] Phone: [phone] Fax: [place], [place] - [place] AT [facility] [place] [address] Phone: [phone] Fax: [phone]  [id number] - [place], [place] [address] [place] [address] Phone: [phone] Fax: [phone]  [facility] [place], [place] [facility] [address] [place] [address] Phone: [phone] Fax: [phone]  Patient is requesting a refill of the following medications: Pending Prescriptions:                       Disp   Refills   Semaglutide (RYBELSUS) 7 MG TABS           30 tab*0       Sig: Take 7 mg by mouth daily.   Date last filled: 03/28/21 Previously prescribed by Dr Volanda Napoleon Dr Jearld Shines  Lab Results      Component                Value               Date                      HGBA1C                   5.6                 04/25/2021                HGBA1C                   5.7 (H)             08/22/2020                HGBA1C                   5.6                 09/30/2019           Lab Results      Component                Value               Date                      LDLCALC                  134 (H)             04/25/2021                CREATININE               1.03 (H)            04/25/2021  Lab Results      Component                Value               Date                       VD25OH                   34.6                04/25/2021                VD25OH                   30.3                08/22/2020                VD25OH                   55.07               09/21/2019            BP Readings from Last 3 Encounters: 04/24/21 : 115/76 03/28/21 : 116/65 02/28/21 : 113/73

## 2021-05-19 ENCOUNTER — Encounter: Payer: Self-pay | Admitting: Family

## 2021-05-19 ENCOUNTER — Ambulatory Visit: Payer: BC Managed Care – PPO | Admitting: Family

## 2021-05-19 VITALS — BP 120/70 | HR 75 | Temp 98.2°F | Ht 67.0 in | Wt 226.4 lb

## 2021-05-19 DIAGNOSIS — R600 Localized edema: Secondary | ICD-10-CM | POA: Diagnosis not present

## 2021-05-19 DIAGNOSIS — R1032 Left lower quadrant pain: Secondary | ICD-10-CM

## 2021-05-19 LAB — CBC WITH DIFFERENTIAL/PLATELET
Basophils Absolute: 0.1 10*3/uL (ref 0.0–0.1)
Basophils Relative: 1.2 % (ref 0.0–3.0)
Eosinophils Absolute: 0.1 10*3/uL (ref 0.0–0.7)
Eosinophils Relative: 3 % (ref 0.0–5.0)
HCT: 37.5 % (ref 36.0–46.0)
Hemoglobin: 12 g/dL (ref 12.0–15.0)
Lymphocytes Relative: 26.7 % (ref 12.0–46.0)
Lymphs Abs: 1.2 10*3/uL (ref 0.7–4.0)
MCHC: 32.1 g/dL (ref 30.0–36.0)
MCV: 87.6 fl (ref 78.0–100.0)
Monocytes Absolute: 0.6 10*3/uL (ref 0.1–1.0)
Monocytes Relative: 12.4 % — ABNORMAL HIGH (ref 3.0–12.0)
Neutro Abs: 2.6 10*3/uL (ref 1.4–7.7)
Neutrophils Relative %: 56.7 % (ref 43.0–77.0)
Platelets: 213 10*3/uL (ref 150.0–400.0)
RBC: 4.29 Mil/uL (ref 3.87–5.11)
RDW: 13.5 % (ref 11.5–15.5)
WBC: 4.5 10*3/uL (ref 4.0–10.5)

## 2021-05-19 LAB — COMPREHENSIVE METABOLIC PANEL
ALT: 22 U/L (ref 0–35)
AST: 23 U/L (ref 0–37)
Albumin: 4 g/dL (ref 3.5–5.2)
Alkaline Phosphatase: 50 U/L (ref 39–117)
BUN: 25 mg/dL — ABNORMAL HIGH (ref 6–23)
CO2: 29 mEq/L (ref 19–32)
Calcium: 9.4 mg/dL (ref 8.4–10.5)
Chloride: 101 mEq/L (ref 96–112)
Creatinine, Ser: 1 mg/dL (ref 0.40–1.20)
GFR: 59.58 mL/min — ABNORMAL LOW (ref >60.00)
Glucose, Bld: 76 mg/dL (ref 70–99)
Potassium: 4.2 mEq/L (ref 3.5–5.1)
Sodium: 139 mEq/L (ref 135–145)
Total Bilirubin: 0.6 mg/dL (ref 0.2–1.2)
Total Protein: 7.4 g/dL (ref 6.0–8.3)

## 2021-05-19 MED ORDER — AMOXICILLIN-POT CLAVULANATE 875-125 MG PO TABS: 1.0000 | ORAL_TABLET | Freq: Two times a day (BID) | ORAL | 0 refills | Status: AC

## 2021-05-19 NOTE — Progress Notes (Signed)
Tonya Taylor is a 66 y.o. female with the following history as recorded in EpicCare:  Patient Active Problem List   Diagnosis Date Noted   Back pain 03/20/2017   URI (upper respiratory infection) 07/24/2016   Vitamin D deficiency 03/16/2015   Wellness examination 03/01/2014   Hyperglycemia 02/20/2013   Menopausal state 03/05/2012   Abnormal ECG 02/19/2011   NASH (nonalcoholic steatohepatitis) 02/07/2010   NEUTROPENIA UNSPECIFIED 01/31/2009   Iron deficiency anemia due to chronic blood loss 04/05/2008   FOOT PAIN, RIGHT 04/05/2008   ANKLE PAIN, LEFT 01/02/2008   EDEMA 01/02/2008   Dyslipidemia 02/01/2007   Essential hypertension 02/01/2007   INSOMNIA 02/01/2007   TOBACCO USE, QUIT 02/01/2007    Current Outpatient Medications  Medication Sig Dispense Refill   amoxicillin-clavulanate (AUGMENTIN) 875-125 MG tablet Take 1 tablet by mouth 2 (two) times daily for 7 days. 14 tablet 0   Ascorbic Acid (VITAMIN C) 1000 MG tablet Take 1,000 mg by mouth daily.     Calcium Carbonate-Vit D-Min (CALCIUM 600+D PLUS MINERALS PO) Take 2 tablets by mouth daily.     cetirizine (ZYRTEC) 10 MG tablet Take 10 mg by mouth daily.     Cholecalciferol (VITAMIN D3) 50 MCG (2000 UT) TABS Take 50 mcg by mouth.     Coenzyme Q10 (COQ10 PO) Take 1 capsule by mouth daily.     CVS Fiber Gummies 2 g CHEW Chew by mouth.     Cyanocobalamin (VITAMIN B-12) 5000 MCG TBDP Take 5,000 mcg by mouth daily.     docusate sodium (COLACE) 100 MG capsule Take 100 mg by mouth 2 (two) times daily.     ECHINACEA PO Take 1 capsule by mouth daily. 728m     fluticasone (FLONASE) 50 MCG/ACT nasal spray Place 2 sprays into both nostrils daily. 16 g 6   meloxicam (MOBIC) 7.5 MG tablet Take 1 tablet daily as directed 90 tablet 0   Multiple Vitamin (MULTIVITAMIN) tablet Take 1 tablet by mouth daily.     Omega-3 1400 MG CAPS Take 1 capsule by mouth daily.     OVER THE COUNTER MEDICATION Hair, skin, nails gummies- 1 po daily      Semaglutide (RYBELSUS) 7 MG TABS Take 7 mg by mouth daily. 30 tablet 0   simvastatin (ZOCOR) 20 MG tablet Take 1 tablet (20 mg total) by mouth at bedtime. 90 tablet 3   spironolactone (ALDACTONE) 25 MG tablet Take 1 tablet (25 mg total) by mouth daily. 90 tablet 3   telmisartan-hydrochlorothiazide (MICARDIS HCT) 80-25 MG tablet Take 1 tablet by mouth daily. 90 tablet 3   Vitamin E 400 units TABS Take 400 Units by mouth daily.     No current facility-administered medications for this visit.    Allergies: Patient has no known allergies.  Past Medical History:  Diagnosis Date   Allergy    ANEMIA DUE TO CHRONIC BLOOD LOSS 04/05/2008   Edema 01/02/2008   Glaucoma    HYPERLIPIDEMIA 02/01/2007   HYPERTENSION 02/01/2007   INSOMNIA 02/01/2007   NEUTROPENIA UNSPECIFIED 01/31/2009   Obesity    Osteopenia    Other chronic nonalcoholic liver disease 154/10/2701  TOBACCO USE, QUIT 02/01/2007    Past Surgical History:  Procedure Laterality Date   COLONOSCOPY     DENTAL SURGERY     HAND SURGERY     removed a nerve    KNEE ARTHROSCOPY Left    ORIF SCAPHOID FRACTURE Right 07/08/2017   Procedure: OPEN REDUCTION INTERNAL FIXATION OF RIGHT  SCAPHOID FRACTURE;  Surgeon: Milly Jakob, MD;  Location: [facility];  Service: Orthopedics;  Laterality: Right;    Family History  Problem Relation Age of Onset   Cancer Mother        Lung Cancer   Lung cancer Mother    Prostate cancer Father    Cancer Father    Thyroid disease Father    Colon cancer Neg Hx    Esophageal cancer Neg Hx    Liver cancer Neg Hx    Pancreatic cancer Neg Hx    Rectal cancer Neg Hx    Stomach cancer Neg Hx     Social History   Tobacco Use   Smoking status: Former    Types: Cigarettes   Smokeless tobacco: Never  Substance Use Topics   Alcohol use: No    Alcohol/week: 0.0 standard drinks    Subjective:   Patient presents with concerns for left sided lower abdominal pain; symptoms x 5 days;  Notes that pain  started on Monday while on the way to school/ work- that pain has now subsided; Has been drinking more water this week; has had normal bowel movement;  Today mostly concerned about sensation that legs are swollen/ feel "tight and swollen" behind knees; is wearing compression stockings;   Took Methocarbamol earlier this week with some benefit; is under the care of Dr. Caprice Beaver ( orthopedist);     Objective:  Vitals:   05/19/21 1130  BP: 120/70  Pulse: 75  Temp: 98.2 F (36.8 C)  TempSrc: Oral  SpO2: 95%  Weight: 226 lb 6.4 oz (102.7 kg)  Height: 5' 7"  (1.702 m)    General: Well developed, well nourished, in no acute distress  Skin : Warm and dry.  Head: Normocephalic and atraumatic  Eyes: Sclera and conjunctiva clear; pupils round and reactive to light; extraocular movements intact  Ears: External normal; canals clear; tympanic membranes normal  Oropharynx: Pink, supple. No suspicious lesions  Neck: Supple without thyromegaly, adenopathy  Lungs: Respirations unlabored; clear to auscultation bilaterally without wheeze, rales, rhonchi  CVS exam: normal rate and regular rhythm.  Abdomen: Soft; nontender; nondistended;  Musculoskeletal: No deformities; no active joint inflammation  Extremities: mild edema bilaterally, cyanosis, clubbing  Vessels: Symmetric bilaterally  Neurologic: Alert and oriented; speech intact; face symmetrical; moves all extremities well; CNII-XII intact without focal deficit   Assessment:  1. Pedal edema   2. Left lower quadrant abdominal pain     Plan:  ? Possible Baker's Cyst; feel DVT unlikely but patient admits concern due to family history; will update imaging; continue Mobic and Methocarbamol for leg pain; ? Resolving diverticulitis; physical exam is reassuring- do not feel CT warranted today; check CBC, CMP; Rx for Augmentin given- this could also cover for atypical UTI presentation;  This visit occurred during the SARS-CoV-2 public health emergency.   Safety protocols were in place, including screening questions prior to the visit, additional usage of staff PPE, and extensive cleaning of exam room while observing appropriate contact time as indicated for disinfecting solutions.    No follow-ups on file.  Orders Placed This Encounter  Procedures   CBC with Differential/Platelet   Comp Met (CMET)    Requested Prescriptions   Signed Prescriptions Disp Refills   amoxicillin-clavulanate (AUGMENTIN) 875-125 MG tablet 14 tablet 0    Sig: Take 1 tablet by mouth 2 (two) times daily for 7 days.

## 2021-05-22 ENCOUNTER — Ambulatory Visit (HOSPITAL_COMMUNITY)
Admission: RE | Admit: 2021-05-22 | Discharge: 2021-05-22 | Disposition: A | Discharge status: Home / Self Care | Payer: BC Managed Care – PPO | Source: Ambulatory Visit | Attending: Cardiology | Admitting: Cardiology

## 2021-05-22 ENCOUNTER — Other Ambulatory Visit: Payer: Self-pay

## 2021-05-22 DIAGNOSIS — R600 Localized edema: Secondary | ICD-10-CM | POA: Insufficient documentation

## 2021-05-23 ENCOUNTER — Encounter: Payer: Self-pay | Admitting: Family

## 2021-05-24 NOTE — Telephone Encounter (Signed)
Patient called stating she needed to speak to Mickel Baas, she was informed that a message would be sent. When asked what the message was about pt stated it was private and did not want to discuss it with me. She was advised to at least mention what it was about so Mickel Baas would have an idea of what's going on and pt refused and only wanted to speak to Mickel Baas or Psychologist, sport and exercise. Please advise.

## 2021-05-25 ENCOUNTER — Encounter: Payer: Self-pay | Admitting: Family

## 2021-05-25 ENCOUNTER — Telehealth (INDEPENDENT_AMBULATORY_CARE_PROVIDER_SITE_OTHER): Payer: BC Managed Care – PPO | Admitting: Family

## 2021-05-25 VITALS — Ht 67.0 in | Wt 226.0 lb

## 2021-05-25 DIAGNOSIS — M25561 Pain in right knee: Secondary | ICD-10-CM

## 2021-05-25 DIAGNOSIS — Z9181 History of falling: Secondary | ICD-10-CM

## 2021-05-25 DIAGNOSIS — M7918 Myalgia, other site: Secondary | ICD-10-CM

## 2021-05-25 NOTE — Telephone Encounter (Signed)
I have called the pt and gave her a VV with Mickel Baas today. She stated that she wants to talk about the scan she recently had and also the next steps after her appointment.

## 2021-05-25 NOTE — Telephone Encounter (Signed)
I have called pt and there was no answer so I left a message to call back.

## 2021-05-26 ENCOUNTER — Telehealth: Payer: Self-pay | Admitting: Family

## 2021-05-26 NOTE — Progress Notes (Signed)
Tonya Taylor is a 66 y.o. female with the following history as recorded in EpicCare:  Patient Active Problem List   Diagnosis Date Noted   Back pain 03/20/2017   URI (upper respiratory infection) 07/24/2016   Vitamin D deficiency 03/16/2015   Wellness examination 03/01/2014   Hyperglycemia 02/20/2013   Menopausal state 03/05/2012   Abnormal ECG 02/19/2011   NASH (nonalcoholic steatohepatitis) 02/07/2010   NEUTROPENIA UNSPECIFIED 01/31/2009   Iron deficiency anemia due to chronic blood loss 04/05/2008   FOOT PAIN, RIGHT 04/05/2008   ANKLE PAIN, LEFT 01/02/2008   EDEMA 01/02/2008   Dyslipidemia 02/01/2007   Essential hypertension 02/01/2007   INSOMNIA 02/01/2007   TOBACCO USE, QUIT 02/01/2007    Current Outpatient Medications  Medication Sig Dispense Refill   amoxicillin-clavulanate (AUGMENTIN) 875-125 MG tablet Take 1 tablet by mouth 2 (two) times daily for 7 days. 14 tablet 0   Ascorbic Acid (VITAMIN C) 1000 MG tablet Take 1,000 mg by mouth daily.     Calcium Carbonate-Vit D-Min (CALCIUM 600+D PLUS MINERALS PO) Take 2 tablets by mouth daily.     cetirizine (ZYRTEC) 10 MG tablet Take 10 mg by mouth daily.     Cholecalciferol (VITAMIN D3) 50 MCG (2000 UT) TABS Take 50 mcg by mouth.     Coenzyme Q10 (COQ10 PO) Take 1 capsule by mouth daily.     CVS Fiber Gummies 2 g CHEW Chew by mouth.     Cyanocobalamin (VITAMIN B-12) 5000 MCG TBDP Take 5,000 mcg by mouth daily.     docusate sodium (COLACE) 100 MG capsule Take 100 mg by mouth 2 (two) times daily.     ECHINACEA PO Take 1 capsule by mouth daily. 728m     fluticasone (FLONASE) 50 MCG/ACT nasal spray Place 2 sprays into both nostrils daily. 16 g 6   meloxicam (MOBIC) 7.5 MG tablet Take 1 tablet daily as directed 90 tablet 0   Multiple Vitamin (MULTIVITAMIN) tablet Take 1 tablet by mouth daily.     Omega-3 1400 MG CAPS Take 1 capsule by mouth daily.     OVER THE COUNTER MEDICATION Hair, skin, nails gummies- 1 po daily      Semaglutide (RYBELSUS) 7 MG TABS Take 7 mg by mouth daily. 30 tablet 0   simvastatin (ZOCOR) 20 MG tablet Take 1 tablet (20 mg total) by mouth at bedtime. 90 tablet 3   spironolactone (ALDACTONE) 25 MG tablet Take 1 tablet (25 mg total) by mouth daily. 90 tablet 3   telmisartan-hydrochlorothiazide (MICARDIS HCT) 80-25 MG tablet Take 1 tablet by mouth daily. 90 tablet 3   Vitamin E 400 units TABS Take 400 Units by mouth daily.     No current facility-administered medications for this visit.    Allergies: Patient has no known allergies.  Past Medical History:  Diagnosis Date   Allergy    ANEMIA DUE TO CHRONIC BLOOD LOSS 04/05/2008   Edema 01/02/2008   Glaucoma    HYPERLIPIDEMIA 02/01/2007   HYPERTENSION 02/01/2007   INSOMNIA 02/01/2007   NEUTROPENIA UNSPECIFIED 01/31/2009   Obesity    Osteopenia    Other chronic nonalcoholic liver disease 154/10/2701  TOBACCO USE, QUIT 02/01/2007    Past Surgical History:  Procedure Laterality Date   COLONOSCOPY     DENTAL SURGERY     HAND SURGERY     removed a nerve    KNEE ARTHROSCOPY Left    ORIF SCAPHOID FRACTURE Right 07/08/2017   Procedure: OPEN REDUCTION INTERNAL FIXATION OF RIGHT  SCAPHOID FRACTURE;  Surgeon: Milly Jakob, MD;  Location: [facility];  Service: Orthopedics;  Laterality: Right;    Family History  Problem Relation Age of Onset   Cancer Mother        Lung Cancer   Lung cancer Mother    Prostate cancer Father    Cancer Father    Thyroid disease Father    Colon cancer Neg Hx    Esophageal cancer Neg Hx    Liver cancer Neg Hx    Pancreatic cancer Neg Hx    Rectal cancer Neg Hx    Stomach cancer Neg Hx     Social History   Tobacco Use   Smoking status: Former    Types: Cigarettes   Smokeless tobacco: Never  Substance Use Topics   Alcohol use: No    Alcohol/week: 0.0 standard drinks    Subjective:    I connected with Tonya Taylor on 05/26/21 at  3:00 PM EST by a telephone call and verified that  I am speaking with the correct person using two identifiers.   I discussed the limitations of evaluation and management by telemedicine and the availability of in person appointments. The patient expressed understanding and agreed to proceed. Provider in office/ patient is at home; provider and patient are only 2 people on telephone call.   Patient was seen on Monday, May 23, 2020 for venous doppler ultrasound; She notes she slipped on a wet floor while changing out of pants/ into gown to get prepared for ultrasound. She notes the floor in front of the table was wet. She notes she did mention to the tech about the fall but was concerned that no mention of the fall was mentioned to her before leaving her appointment on Monday.  She is concerned about right knee pain/ buttock pain that has developed since the fall. She is wanting to get the fall documented and wondering about how to proceed since the injury occurred while at a medical facility for testing.   Objective:  Vitals:   05/25/21 0828  Weight: 226 lb (102.5 kg)  Height: 5\' 7"  (1.702 m)    Lungs: Respirations unlabored;  Neurologic: Alert and oriented; speech intact; face symmetrical;   Assessment:  1. Acute pain of right knee   2. Buttock pain   3. History of recent fall     Plan:  Expressed understanding at patient's concerns and recommended need for treatment due to the injury. Patient would like direction as to how she should handle the follow up- questioning if she should see her own orthopedist ( Dr. Caprice Beaver at [facility]) or will she be directed to orthopedist through [facility] since injury occurred at [facility] facility.  I explained to patient that I would reach out to my office manager to help get the care she needs. She appreciated our phone call/ plan of action and was agreeable to waiting to hear back regarding further direction.   Time spent 15 minutes  No follow-ups on file.  No orders of the defined types were placed in this  encounter.   Requested Prescriptions    No prescriptions requested or ordered in this encounter

## 2021-05-26 NOTE — Telephone Encounter (Signed)
I attempted to call patient at her school as requested. However, recording from school indicated that front office was closed. I left message on patient's VM regarding our virtual visit yesterday. I left message to notify her that fall investigation has been opened but that did not want to delay care. Asked her to go ahead and make follow up appointment with her orthopedist and to save receipts/ notes; she will be contacted regarding next steps pending investigation outcome; asked her to call back with other questions.

## 2021-06-02 ENCOUNTER — Telehealth: Payer: Self-pay | Admitting: Family

## 2021-06-02 NOTE — Telephone Encounter (Signed)
Tonya Taylor has called [place] back and we have received a smooth transfer.   She stated that she wanted to let us know that she has gotten the message from [place]. She stated that her coccyx is still sore. She is unable to sit on hard surfaces. She has had a very busy week at school since it is the end of the semester and she will make the appointment about the bakers cyst and then another appointment about her coccyx and keep the receipt. I have informed her that the only information we can give her currently is that an investigation has been launched. She stated understanding and someone named Missy an supervisor at [employer] has called and talked to her. Tonya Huante has not been able to contact Missy again after the initial phone call.     I stated understanding and informed her to keep [place] posted.   FYI to provider.

## 2021-06-06 ENCOUNTER — Ambulatory Visit (INDEPENDENT_AMBULATORY_CARE_PROVIDER_SITE_OTHER): Payer: BC Managed Care – PPO | Admitting: Family Medicine

## 2021-06-06 ENCOUNTER — Other Ambulatory Visit: Payer: Self-pay

## 2021-06-06 ENCOUNTER — Encounter (INDEPENDENT_AMBULATORY_CARE_PROVIDER_SITE_OTHER): Payer: Self-pay | Admitting: Family Medicine

## 2021-06-06 VITALS — BP 120/72 | HR 75 | Temp 97.9°F | Ht 67.0 in | Wt 221.0 lb

## 2021-06-06 DIAGNOSIS — E669 Obesity, unspecified: Secondary | ICD-10-CM | POA: Diagnosis not present

## 2021-06-06 DIAGNOSIS — Z6834 Body mass index (BMI) 34.0-34.9, adult: Secondary | ICD-10-CM | POA: Diagnosis not present

## 2021-06-06 DIAGNOSIS — I10 Essential (primary) hypertension: Secondary | ICD-10-CM

## 2021-06-06 DIAGNOSIS — R7303 Prediabetes: Secondary | ICD-10-CM | POA: Diagnosis not present

## 2021-06-06 MED ORDER — RYBELSUS 7 MG PO TABS: 7.0000 mg | ORAL_TABLET | Freq: Every day | ORAL | 0 refills | Status: DC

## 2021-06-07 ENCOUNTER — Telehealth: Payer: Self-pay | Admitting: Family

## 2021-06-07 NOTE — Progress Notes (Signed)
Chief Complaint:   OBESITY Tonya Taylor is here to discuss her progress with her obesity treatment plan along with follow-up of her obesity related diagnoses. Tonya Taylor is on the Category 3 Plan and states she is following her eating plan approximately 90% of the time. Tonya Taylor states she is walking 15 minutes 4 times per week.  Today's visit was #: 11 Starting weight: 237 lbs Starting date: 09/30/2019 Today's weight: 221 lbs Today's date: 06/06/2021 Total lbs lost to date: 16 Total lbs lost since last in-office visit: 0  Interim History: Pt had a restful holiday and did some sewing. She found tostito's rounds and Fairlife drinks and is doing those for lunch. Pt is doing some alfredo sauce with shrimp bowls with panini noodles and broccoli. She has added in extra shrimp. She is eating applesauce, sliced apple, yogurt, and Fairlife in morning. Scale shows 5 lb of water gain.  Subjective:   1. Prediabetes Pt's last A1c was 5.6 with an insulin level of 25.4. She is on Rybelsus with good control of BS.  2. Essential hypertension BP well controlled on Micardis, Hct, and Aldactone.  Assessment/Plan:   1. Prediabetes Tonya Taylor will continue to work on weight loss, exercise, and decreasing simple carbohydrates to help decrease the risk of diabetes.   Refill- Semaglutide (RYBELSUS) 7 MG TABS; Take 7 mg by mouth daily.  Dispense: 30 tablet; Refill: 0  2. Essential hypertension Tonya Taylor is working on healthy weight loss and exercise to improve blood pressure control. We will watch for signs of hypotension as she continues her lifestyle modifications. Continue current meds and f/u on BP at next appt.  3. Obesity with current BMI of 34.7 Tonya Taylor is currently in the action stage of change. As such, her goal is to continue with weight loss efforts. She has agreed to the Category 3 Plan.   Exercise goals:  Pt is to start resistance training 10 minutes 3 days a week.  Behavioral modification  strategies: increasing lean protein intake, meal planning and cooking strategies, keeping healthy foods in the home, and planning for success.  Tonya Taylor has agreed to follow-up with our clinic in 4 weeks. She was informed of the importance of frequent follow-up visits to maximize her success with intensive lifestyle modifications for her multiple health conditions.   Objective:   Blood pressure 120/72, pulse 75, temperature 97.9 F (36.6 C), height 5' 7"  (1.702 m), weight 221 lb (100.2 kg), SpO2 100 %. Body mass index is 34.61 kg/m.  General: Cooperative, alert, well developed, in no acute distress. HEENT: Conjunctivae and lids unremarkable. Cardiovascular: Regular rhythm.  Lungs: Normal work of breathing. Neurologic: No focal deficits.   Lab Results  Component Value Date   CREATININE 1.00 05/19/2021   BUN 25 (H) 05/19/2021   NA 139 05/19/2021   K 4.2 05/19/2021   CL 101 05/19/2021   CO2 29 05/19/2021   Lab Results  Component Value Date   ALT 22 05/19/2021   AST 23 05/19/2021   ALKPHOS 50 05/19/2021   BILITOT 0.6 05/19/2021   Lab Results  Component Value Date   HGBA1C 5.6 04/25/2021   HGBA1C 5.7 (H) 08/22/2020   HGBA1C 5.6 09/30/2019   HGBA1C 5.6 03/19/2016   HGBA1C 5.5 03/16/2015   Lab Results  Component Value Date   INSULIN 25.4 (H) 04/25/2021   INSULIN 16.8 08/22/2020   INSULIN 21.5 09/30/2019   Lab Results  Component Value Date   TSH 3.19 09/21/2019   Lab Results  Component Value  Date   CHOL 196 04/25/2021   HDL 45 04/25/2021   LDLCALC 134 (H) 04/25/2021   TRIG 94 04/25/2021   CHOLHDL 3.3 08/22/2020   Lab Results  Component Value Date   VD25OH 34.6 04/25/2021   VD25OH 30.3 08/22/2020   VD25OH 55.07 09/21/2019   Lab Results  Component Value Date   WBC 4.5 05/19/2021   HGB 12.0 05/19/2021   HCT 37.5 05/19/2021   MCV 87.6 05/19/2021   PLT 213.0 05/19/2021   Lab Results  Component Value Date   IRON 61 03/20/2017   Attestation Statements:    Reviewed by clinician on day of visit: allergies, medications, problem list, medical history, surgical history, family history, social history, and previous encounter notes.  Coral Ceo, CMA, am acting as transcriptionist for Coralie Common, MD.  I have reviewed the above documentation for accuracy and completeness, and I agree with the above. - Coralie Common, MD

## 2021-06-07 NOTE — Telephone Encounter (Signed)
Patient would like a call back from [place] or [place] to speak about her hip injury. When asked to elaborate, the patient stated is long and that Mickel Baas would know what she's talking about. Patient did not go into detail, and that she can be reached on her cell phone. Please advice.

## 2021-06-08 NOTE — Telephone Encounter (Signed)
Patient returned phone call. Patient stated to contact her around 430 due to appointment

## 2021-06-08 NOTE — Telephone Encounter (Signed)
I have called pt and she stated that she is at the doctors office and if she gets out before 5pm she will give [place] a call back. I stated understanding and informed her if I do not hear back from her today I will call her in the morning.

## 2021-06-08 NOTE — Telephone Encounter (Signed)
I have called pt at both numbers and there was no answer so I left a message to call back.

## 2021-06-09 NOTE — Telephone Encounter (Signed)
I have called pt to gather more information. There was no answers so I left a message to call back.

## 2021-06-09 NOTE — Telephone Encounter (Addendum)
Saw the Orthopedist yesterday.   The baker's cyst is fluid around the area.   [facility] told pt to speak to Pt experience and she will.  She stated she will call them and find out what she can get done.  Pt reports" I needs to pick up a pillow for my butt and all these bills are now accumulating for my insurance and I don't think that is right. I will call patient experience to see what they tell me". "Am I in left field by myself?"   She stated that she will keep [place] updated and call them now. Pt wanted to ensure her comments were documented.   Pt stated that she just wanted to update Mickel Baas on the situation.

## 2021-06-27 NOTE — Telephone Encounter (Signed)
Opened in error

## 2021-07-03 ENCOUNTER — Other Ambulatory Visit: Payer: Self-pay

## 2021-07-03 ENCOUNTER — Ambulatory Visit (INDEPENDENT_AMBULATORY_CARE_PROVIDER_SITE_OTHER): Payer: BC Managed Care – PPO | Admitting: Family Medicine

## 2021-07-03 ENCOUNTER — Encounter (INDEPENDENT_AMBULATORY_CARE_PROVIDER_SITE_OTHER): Payer: Self-pay | Admitting: Family Medicine

## 2021-07-03 VITALS — BP 156/75 | HR 71 | Temp 97.5°F | Ht 67.0 in | Wt 222.0 lb

## 2021-07-03 DIAGNOSIS — Z6834 Body mass index (BMI) 34.0-34.9, adult: Secondary | ICD-10-CM

## 2021-07-03 DIAGNOSIS — Z6837 Body mass index (BMI) 37.0-37.9, adult: Secondary | ICD-10-CM

## 2021-07-03 DIAGNOSIS — R7303 Prediabetes: Secondary | ICD-10-CM

## 2021-07-03 DIAGNOSIS — I10 Essential (primary) hypertension: Secondary | ICD-10-CM

## 2021-07-03 DIAGNOSIS — Z9189 Other specified personal risk factors, not elsewhere classified: Secondary | ICD-10-CM

## 2021-07-03 DIAGNOSIS — E669 Obesity, unspecified: Secondary | ICD-10-CM

## 2021-07-03 MED ORDER — RYBELSUS 7 MG PO TABS: 7.0000 mg | ORAL_TABLET | Freq: Every day | ORAL | 0 refills | Status: DC

## 2021-07-04 NOTE — Progress Notes (Signed)
Chief Complaint:   OBESITY Tonya Taylor is here to discuss her progress with her obesity treatment plan along with follow-up of her obesity related diagnoses. Tonya Taylor is on the Category 3 Plan and states she is following her eating plan approximately 95% of the time. Tonya Taylor states she is walking and elliptical 10-20 minutes 3-5 times per week.  Today's visit was #: 66 Starting weight: 237 lbs Starting date: 09/30/2019 Today's weight: 222 lbs Today's date: 07/03/2021 Total lbs lost to date: 15 Total lbs lost since last in-office visit: 0  Interim History: Pt is feeling some non-scale victories of lost inches. She is drinking Fairlife protein shake and that is helping with her cravings. She has been able to do elliptical 3 times since her last appt. Pt denies hunger. She feels she does a good job controlling appetite and thinks she is getting protein in.  Subjective:   1. Essential hypertension BP elevated today. Tonya Taylor denies chest pain/chest pressure/headache.  2. Prediabetes Pt is on Rybelsus 7 mg with no GI side effects noted.  3. At risk for activity intolerance Tonya Taylor is at risk for exercise intolerance due to obesity.  Assessment/Plan:   1. Essential hypertension Tonya Taylor is working on healthy weight loss and exercise to improve blood pressure control. We will watch for signs of hypotension as she continues her lifestyle modifications. Continue aldactone and Micardis-HCT. F/u on BP at next appt.  2. Prediabetes Tonya Taylor will continue to work on weight loss, exercise, and decreasing simple carbohydrates to help decrease the risk of diabetes.   Refill- Semaglutide (RYBELSUS) 7 MG TABS; Take 7 mg by mouth daily.  Dispense: 30 tablet; Refill: 0  3. At risk for activity intolerance Tonya Taylor was given approximately 15 minutes of exercise intolerance counseling today. She is 66 y.o. female and has risk factors exercise intolerance including obesity. We discussed intensive  lifestyle modifications today with an emphasis on specific weight loss instructions and strategies. Tonya Taylor will slowly increase activity as tolerated.  Repetitive spaced learning was employed today to elicit superior memory formation and behavioral change.  4. Obesity with current BMI of 34.9 Tonya Taylor is currently in the action stage of change. As such, her goal is to continue with weight loss efforts. She has agreed to the Category 3 Plan.   Exercise goals:  Increase elliptical to 15-20 minutes 2 times a week.  Behavioral modification strategies: increasing lean protein intake, meal planning and cooking strategies, keeping healthy foods in the home, and planning for success.  Tonya Taylor has agreed to follow-up with our clinic in 6-8 weeks. She was informed of the importance of frequent follow-up visits to maximize her success with intensive lifestyle modifications for her multiple health conditions.   Objective:   Blood pressure (!) 156/75, pulse 71, temperature (!) 97.5 F (36.4 C), height 5' 7"  (1.702 m), weight 222 lb (100.7 kg), SpO2 99 %. Body mass index is 34.77 kg/m.  General: Cooperative, alert, well developed, in no acute distress. HEENT: Conjunctivae and lids unremarkable. Cardiovascular: Regular rhythm.  Lungs: Normal work of breathing. Neurologic: No focal deficits.   Lab Results  Component Value Date   CREATININE 1.00 05/19/2021   BUN 25 (H) 05/19/2021   NA 139 05/19/2021   K 4.2 05/19/2021   CL 101 05/19/2021   CO2 29 05/19/2021   Lab Results  Component Value Date   ALT 22 05/19/2021   AST 23 05/19/2021   ALKPHOS 50 05/19/2021   BILITOT 0.6 05/19/2021   Lab Results  Component Value Date   HGBA1C 5.6 04/25/2021   HGBA1C 5.7 (H) 08/22/2020   HGBA1C 5.6 09/30/2019   HGBA1C 5.6 03/19/2016   HGBA1C 5.5 03/16/2015   Lab Results  Component Value Date   INSULIN 25.4 (H) 04/25/2021   INSULIN 16.8 08/22/2020   INSULIN 21.5 09/30/2019   Lab Results   Component Value Date   TSH 3.19 09/21/2019   Lab Results  Component Value Date   CHOL 196 04/25/2021   HDL 45 04/25/2021   LDLCALC 134 (H) 04/25/2021   TRIG 94 04/25/2021   CHOLHDL 3.3 08/22/2020   Lab Results  Component Value Date   VD25OH 34.6 04/25/2021   VD25OH 30.3 08/22/2020   VD25OH 55.07 09/21/2019   Lab Results  Component Value Date   WBC 4.5 05/19/2021   HGB 12.0 05/19/2021   HCT 37.5 05/19/2021   MCV 87.6 05/19/2021   PLT 213.0 05/19/2021   Lab Results  Component Value Date   IRON 61 03/20/2017    Attestation Statements:   Reviewed by clinician on day of visit: allergies, medications, problem list, medical history, surgical history, family history, social history, and previous encounter notes.  Coral Ceo, CMA, am acting as transcriptionist for Coralie Common, MD.   I have reviewed the above documentation for accuracy and completeness, and I agree with the above. - Coralie Common, MD

## 2021-08-04 ENCOUNTER — Other Ambulatory Visit (INDEPENDENT_AMBULATORY_CARE_PROVIDER_SITE_OTHER): Payer: Self-pay | Admitting: Family Medicine

## 2021-08-04 DIAGNOSIS — R7303 Prediabetes: Secondary | ICD-10-CM

## 2021-08-07 NOTE — Telephone Encounter (Signed)
LAST APPOINTMENT DATE: 07/03/21 ?NEXT APPOINTMENT DATE: 08/23/21 ? ? ?[id number] [place] [facility] [address] ?[address]?Phone: [phone]: [phone]? ?[place]?[address]?Phone: [phone]: [phone]? ?[facility] [place]?[address]?[place]?Phone: [phone]: [phone]? ?[place] [place]?[address]?Phone: [phone]: [phone]? ?[id number] [place] [facility] [address]?[place]Phone: [phone]: [phone] ?[facility] [facility] ?[id number]: [phone]: [phone] ?Patient is requesting a refill of the following medications: ?Pending Prescriptions:                       Disp   Refills ?  Semaglutide (RYBELSUS) 7 MG TABS           30 tab*0       ?Sig: Take 7 mg by mouth daily. ? ? ?Date last filled: 07/03/21 ?Previously prescribed by Dr UkJearld Shines ?Lab Results ?     Component                Value               Date                 ?     HGBA1C                   5.6                 04/25/2021           ?     HGBA1C                   5.7 (H)             08/22/2020           ?     HGBA1C                   5.6                 09/30/2019           ?Lab Results ?     Component                Value               Date                 ?     LDLCALC                  134 (H)             04/25/2021           ?     CREATININE               1.00                05/19/2021           ?  Lab Results ?     Component                Value               Date                 ?      VD25OH                   34.6                04/25/2021           ?     VD25OH                   30.3                08/22/2020           ?     VD25OH                   55.07               09/21/2019           ? ?BP Readings from Last 3 Encounters: ?07/03/21 : (!) 156/75 ?06/06/21 : 120/72 ?05/19/21 : 120/70 ?

## 2021-08-08 ENCOUNTER — Other Ambulatory Visit (INDEPENDENT_AMBULATORY_CARE_PROVIDER_SITE_OTHER): Payer: Self-pay | Admitting: Family Medicine

## 2021-08-08 DIAGNOSIS — R7303 Prediabetes: Secondary | ICD-10-CM

## 2021-08-14 ENCOUNTER — Telehealth: Payer: Self-pay | Admitting: Family

## 2021-08-14 ENCOUNTER — Other Ambulatory Visit: Payer: Self-pay | Admitting: Gynecology

## 2021-08-14 ENCOUNTER — Telehealth (INDEPENDENT_AMBULATORY_CARE_PROVIDER_SITE_OTHER): Payer: Self-pay | Admitting: Family Medicine

## 2021-08-14 DIAGNOSIS — Z1231 Encounter for screening mammogram for malignant neoplasm of breast: Secondary | ICD-10-CM

## 2021-08-14 NOTE — Telephone Encounter (Signed)
Spoke w/ pt, appt scheduled for 08/15/21

## 2021-08-14 NOTE — Telephone Encounter (Signed)
Patient is completely out of Semaglutide (RYBELSUS) 7 MG TABS. She needs a refill asap. Pt stated that she placed a refill request through [facility] on 08/04/2021, but has not received a refill. Please call patient asap at [phone]. ?

## 2021-08-14 NOTE — Telephone Encounter (Signed)
Patient dropped off form to be filled out ?Placed in bin up front ? ?Patient would like to be called when ready to pick up  ?

## 2021-08-15 ENCOUNTER — Ambulatory Visit (INDEPENDENT_AMBULATORY_CARE_PROVIDER_SITE_OTHER): Payer: BC Managed Care – PPO | Admitting: Family Medicine

## 2021-08-15 ENCOUNTER — Encounter (INDEPENDENT_AMBULATORY_CARE_PROVIDER_SITE_OTHER): Payer: Self-pay | Admitting: Family Medicine

## 2021-08-15 VITALS — BP 157/79 | HR 63 | Temp 97.8°F | Ht 67.0 in | Wt 225.0 lb

## 2021-08-15 DIAGNOSIS — E669 Obesity, unspecified: Secondary | ICD-10-CM

## 2021-08-15 DIAGNOSIS — R7303 Prediabetes: Secondary | ICD-10-CM | POA: Diagnosis not present

## 2021-08-15 DIAGNOSIS — Z9189 Other specified personal risk factors, not elsewhere classified: Secondary | ICD-10-CM

## 2021-08-15 DIAGNOSIS — I10 Essential (primary) hypertension: Secondary | ICD-10-CM | POA: Diagnosis not present

## 2021-08-15 DIAGNOSIS — Z6835 Body mass index (BMI) 35.0-35.9, adult: Secondary | ICD-10-CM | POA: Diagnosis not present

## 2021-08-15 MED ORDER — RYBELSUS 14 MG PO TABS: 14.0000 mg | ORAL_TABLET | Freq: Every day | ORAL | 0 refills | Status: DC

## 2021-08-15 NOTE — Telephone Encounter (Signed)
I have called Tonya Taylor and informed her I got the handicap placard paperwork and we will get it completed when Mickel Baas is back next week. Tonya Taylor stated understanding.  ? ?Fyi to provider.  ?

## 2021-08-16 NOTE — Telephone Encounter (Signed)
It is for her knee. ?

## 2021-08-18 NOTE — Progress Notes (Signed)
? ? ? ?Chief Complaint:  ? ?OBESITY ?Tonya Taylor is here to discuss her progress with her obesity treatment plan along with follow-up of her obesity related diagnoses. [name] is on the Category 3 Plan and states she is following her eating plan approximately 95% of the time. [name] states she is walking for 15-20 minutes 3-4 times per week. ? ?Today's visit was #: 28 ?Starting weight: 237 lbs ?Starting date: 09/30/2019 ?Today's weight: 225 lbs ?Today's date: 08/15/2021 ?Total lbs lost to date: 12 ?Total lbs lost since last in-office visit: 0 ? ?Interim History: [name] has a lot of stressors at work, and she is eating more candy than usual. No time for lunch and she tends to snack more in the afternoons.  ? ?Subjective:  ? ?1. Benign essential hypertension ?[name] has a blood pressure monitor at home but she hasn't checked her blood pressure. She denies symptoms or concerns. She is on Micardis and spironolactone.  ? ?2. Prediabetes ?[name] has been on Rybelsus at 7 mg for several months now. She feels she needs an increase in her dose.  ? ?3. At risk for side effect of medication ?[name] is at risk for drug side effects due to increased dose of Rybelsus today.  ? ?Assessment/Plan:  ?No orders of the defined types were placed in this encounter. ? ? ?Medications Discontinued During This Encounter  ?Medication Reason  ? Semaglutide (RYBELSUS) 7 MG TABS   ?  ? ?Meds ordered this encounter  ?Medications  ? Semaglutide (RYBELSUS) 14 MG TABS  ?  Sig: Take 1 tablet (14 mg total) by mouth daily.  ?  Dispense:  30 tablet  ?  Refill:  0  ?  ? ?1. Benign essential hypertension ?[name] is to check her blood pressure 3 days per week at home. She is write down her readings. She will continue her current medications and doses as the patient declines to add low dose Norvasc to her regimen today.  ? ?2. Prediabetes ?[name] agreed to increase Rybelsus to 14 mg, for a couple of months. She will work on developing healthier  habits and not skip lunch, and her goal is to eat everything on the plan. She will decrease simple carbs and increase her protein intake.  ? ?- Semaglutide (RYBELSUS) 14 MG TABS; Take 1 tablet (14 mg total) by mouth daily.  Dispense: 30 tablet; Refill: 0 ? ?3. At risk for side effect of medication ?[name] was given approximately 11 minutes of drug side effect counseling today.  We discussed side effect possibility and risk versus benefits. [name] agreed to the medication and will contact this office if these side effects are intolerable. ? ?Repetitive spaced learning was employed today to elicit superior memory formation and behavioral change. ? ?4. Obesity, current BMI 35.2 ?Athziry is currently in the action stage of change. As such, her goal is to continue with weight loss efforts. She has agreed to the Category 3 Plan.  ? ?Focus on getting in lunch calories/protein.  ? ?Exercise goals: As is.  ? ?Behavioral modification strategies: increasing lean protein intake and no skipping meals. ? ?[name] has agreed to follow-up with our clinic in 4 weeks with Dr. Jearld Shines. She was informed of the importance of frequent follow-up visits to maximize her success with intensive lifestyle modifications for her multiple health conditions.  ? ?Objective:  ? ?Blood pressure (!) 157/79, pulse 63, temperature 97.8 ?F (36.6 ?C), height 5' 7"  (1.702 m), weight 225 lb (102.1 kg), SpO2 98 %. ?Body mass index  is 35.24 kg/m?. ? ?General: Cooperative, alert, well developed, in no acute distress. ?HEENT: Conjunctivae and lids unremarkable. ?Cardiovascular: Regular rhythm.  ?Lungs: Normal work of breathing. ?Neurologic: No focal deficits.  ? ?Lab Results  ?Component Value Date  ? CREATININE 1.00 05/19/2021  ? BUN 25 (H) 05/19/2021  ? NA 139 05/19/2021  ? K 4.2 05/19/2021  ? CL 101 05/19/2021  ? CO2 29 05/19/2021  ? ?Lab Results  ?Component Value Date  ? ALT 22 05/19/2021  ? AST 23 05/19/2021  ? ALKPHOS 50 05/19/2021  ? BILITOT 0.6  05/19/2021  ? ?Lab Results  ?Component Value Date  ? HGBA1C 5.6 04/25/2021  ? HGBA1C 5.7 (H) 08/22/2020  ? HGBA1C 5.6 09/30/2019  ? HGBA1C 5.6 03/19/2016  ? HGBA1C 5.5 03/16/2015  ? ?Lab Results  ?Component Value Date  ? INSULIN 25.4 (H) 04/25/2021  ? INSULIN 16.8 08/22/2020  ? INSULIN 21.5 09/30/2019  ? ?Lab Results  ?Component Value Date  ? TSH 3.19 09/21/2019  ? ?Lab Results  ?Component Value Date  ? CHOL 196 04/25/2021  ? HDL 45 04/25/2021  ? LDLCALC 134 (H) 04/25/2021  ? TRIG 94 04/25/2021  ? CHOLHDL 3.3 08/22/2020  ? ?Lab Results  ?Component Value Date  ? VD25OH 34.6 04/25/2021  ? VD25OH 30.3 08/22/2020  ? VD25OH 55.07 09/21/2019  ? ?Lab Results  ?Component Value Date  ? WBC 4.5 05/19/2021  ? HGB 12.0 05/19/2021  ? HCT 37.5 05/19/2021  ? MCV 87.6 05/19/2021  ? PLT 213.0 05/19/2021  ? ?Lab Results  ?Component Value Date  ? IRON 61 03/20/2017  ? ?Attestation Statements:  ? ?Reviewed by clinician on day of visit: allergies, medications, problem list, medical history, surgical history, family history, social history, and previous encounter notes. ? ? ?I, Trixie Dredge, am acting as transcriptionist for [employer], DO. ? ?I have reviewed the above documentation for accuracy and completeness, and I agree with the above. Marjory Sneddon, D.O. ? ?The [place] was signed into law in 2016 which includes the topic of electronic health records.  This provides immediate access to information in MyChart.  This includes consultation notes, operative notes, office notes, lab results and pathology reports.  If you have any questions about what you read please let us know at your next visit so we can discuss your concerns and take corrective action if need be.  We are right here with you. ? ? ?

## 2021-08-22 ENCOUNTER — Telehealth: Payer: Self-pay

## 2021-08-22 NOTE — Telephone Encounter (Signed)
I have called pt and spoke to her husband. Informed them that the paperwork has to be done and ready to pick up. A copy has been scanned to pt chart.  ?

## 2021-08-23 ENCOUNTER — Ambulatory Visit (INDEPENDENT_AMBULATORY_CARE_PROVIDER_SITE_OTHER): Payer: BC Managed Care – PPO | Admitting: Family Medicine

## 2021-09-13 ENCOUNTER — Ambulatory Visit (INDEPENDENT_AMBULATORY_CARE_PROVIDER_SITE_OTHER): Payer: BC Managed Care – PPO | Admitting: Family Medicine

## 2021-09-13 ENCOUNTER — Other Ambulatory Visit (INDEPENDENT_AMBULATORY_CARE_PROVIDER_SITE_OTHER): Payer: Self-pay | Admitting: Family Medicine

## 2021-09-13 DIAGNOSIS — R7303 Prediabetes: Secondary | ICD-10-CM

## 2021-09-15 ENCOUNTER — Ambulatory Visit
Admission: RE | Admit: 2021-09-15 | Discharge: 2021-09-15 | Disposition: A | Discharge status: Home / Self Care | Payer: BC Managed Care – PPO | Source: Ambulatory Visit | Attending: Gynecology | Admitting: Gynecology

## 2021-09-15 DIAGNOSIS — Z1231 Encounter for screening mammogram for malignant neoplasm of breast: Secondary | ICD-10-CM

## 2021-10-05 ENCOUNTER — Ambulatory Visit (INDEPENDENT_AMBULATORY_CARE_PROVIDER_SITE_OTHER): Payer: BC Managed Care – PPO | Admitting: Family Medicine

## 2021-10-05 ENCOUNTER — Encounter (INDEPENDENT_AMBULATORY_CARE_PROVIDER_SITE_OTHER): Payer: Self-pay | Admitting: Family Medicine

## 2021-10-05 VITALS — BP 114/71 | HR 73 | Temp 97.9°F | Ht 67.0 in | Wt 226.0 lb

## 2021-10-05 DIAGNOSIS — I10 Essential (primary) hypertension: Secondary | ICD-10-CM | POA: Diagnosis not present

## 2021-10-05 DIAGNOSIS — Z7985 Long-term (current) use of injectable non-insulin antidiabetic drugs: Secondary | ICD-10-CM

## 2021-10-05 DIAGNOSIS — E669 Obesity, unspecified: Secondary | ICD-10-CM | POA: Diagnosis not present

## 2021-10-05 DIAGNOSIS — Z6835 Body mass index (BMI) 35.0-35.9, adult: Secondary | ICD-10-CM

## 2021-10-05 DIAGNOSIS — R7303 Prediabetes: Secondary | ICD-10-CM | POA: Diagnosis not present

## 2021-10-05 MED ORDER — RYBELSUS 14 MG PO TABS: 14.0000 mg | ORAL_TABLET | Freq: Every day | ORAL | 0 refills | Status: DC

## 2021-10-11 NOTE — Progress Notes (Signed)
Chief Complaint:   OBESITY Tonya Taylor is here to discuss her progress with her obesity treatment plan along with follow-up of her obesity related diagnoses. Tonya Taylor is on the Category 3 Plan and states she is following her eating plan approximately 90% of the time. Tonya Taylor states she is walking 15 minutes 4-5 times per week.  Today's visit was #: 24 Starting weight: 237 lbs Starting date: 09/30/2019 Today's weight: 226 lbs Today's date: 10/05/2021 Total lbs lost to date: 11 lbs Total lbs lost since last in-office visit: 1  Interim History: Tonya Taylor is feeling very tired over course of last few weeks. She has a few more weeks before summer vacations. She has been eating more indulgently over the last few weeks. She also was on Rybelsus 14 mg and that helped with indulgences control.   Subjective:   1. Prediabetes Tonya Taylor noticed good control of food choices on Rybelsus 14 mg, needs more than 1 month as she will be out of town.  2. Essential hypertension Tonya Taylor's blood pressure is well controlled today  Assessment/Plan:   1. Prediabetes We will refill Rybelsus 14 mg daily for 1 month with 0 refills.  -Refill Semaglutide (RYBELSUS) 14 MG TABS; Take 1 tablet (14 mg total) by mouth daily.  Dispense: 90 tablet; Refill: 0  2. Essential hypertension Tonya Taylor will continue with current medications at same dose. We will consider decreasing medication if blood pressure continues to be controlled.  3. Obesity, current BMI 35.4 Tonya Taylor is currently in the action stage of change. As such, her goal is to continue with weight loss efforts. She has agreed to the Category 3 Plan and practicing portion control and making smarter food choices, such as increasing vegetables and decreasing simple carbohydrates.   Exercise goals: All adults should avoid inactivity. Some physical activity is better than none, and adults who participate in any amount of physical activity gain some health  benefits.  Behavioral modification strategies: increasing lean protein intake, meal planning and cooking strategies, keeping healthy foods in the home, and planning for success.  Tonya Taylor has agreed to follow-up with our clinic in 6 weeks. She was informed of the importance of frequent follow-up visits to maximize her success with intensive lifestyle modifications for her multiple health conditions.   Objective:   Blood pressure 114/71, pulse 73, temperature 97.9 F (36.6 C), height 5' 7"  (1.702 m), weight 226 lb (102.5 kg), SpO2 99 %. Body mass index is 35.4 kg/m.  General: Cooperative, alert, well developed, in no acute distress. HEENT: Conjunctivae and lids unremarkable. Cardiovascular: Regular rhythm.  Lungs: Normal work of breathing. Neurologic: No focal deficits.   Lab Results  Component Value Date   CREATININE 1.00 05/19/2021   BUN 25 (H) 05/19/2021   NA 139 05/19/2021   K 4.2 05/19/2021   CL 101 05/19/2021   CO2 29 05/19/2021   Lab Results  Component Value Date   ALT 22 05/19/2021   AST 23 05/19/2021   ALKPHOS 50 05/19/2021   BILITOT 0.6 05/19/2021   Lab Results  Component Value Date   HGBA1C 5.6 04/25/2021   HGBA1C 5.7 (H) 08/22/2020   HGBA1C 5.6 09/30/2019   HGBA1C 5.6 03/19/2016   HGBA1C 5.5 03/16/2015   Lab Results  Component Value Date   INSULIN 25.4 (H) 04/25/2021   INSULIN 16.8 08/22/2020   INSULIN 21.5 09/30/2019   Lab Results  Component Value Date   TSH 3.19 09/21/2019   Lab Results  Component Value Date   CHOL 196  04/25/2021   HDL 45 04/25/2021   LDLCALC 134 (H) 04/25/2021   TRIG 94 04/25/2021   CHOLHDL 3.3 08/22/2020   Lab Results  Component Value Date   VD25OH 34.6 04/25/2021   VD25OH 30.3 08/22/2020   VD25OH 55.07 09/21/2019   Lab Results  Component Value Date   WBC 4.5 05/19/2021   HGB 12.0 05/19/2021   HCT 37.5 05/19/2021   MCV 87.6 05/19/2021   PLT 213.0 05/19/2021   Lab Results  Component Value Date   IRON 61  03/20/2017   Attestation Statements:   Reviewed by clinician on day of visit: allergies, medications, problem list, medical history, surgical history, family history, social history, and previous encounter notes.  I, Elnora Morrison, RMA am acting as transcriptionist for Coralie Common, MD. I have reviewed the above documentation for accuracy and completeness, and I agree with the above. - Coralie Common, MD

## 2021-10-17 ENCOUNTER — Ambulatory Visit (INDEPENDENT_AMBULATORY_CARE_PROVIDER_SITE_OTHER): Payer: BC Managed Care – PPO | Admitting: Family

## 2021-10-17 ENCOUNTER — Encounter: Payer: Self-pay | Admitting: Family

## 2021-10-17 VITALS — BP 134/72 | HR 63 | Temp 98.0°F | Resp 12 | Ht 67.0 in | Wt 227.8 lb

## 2021-10-17 DIAGNOSIS — I10 Essential (primary) hypertension: Secondary | ICD-10-CM

## 2021-10-17 DIAGNOSIS — Z0000 Encounter for general adult medical examination without abnormal findings: Secondary | ICD-10-CM | POA: Diagnosis not present

## 2021-10-17 MED ORDER — SPIRONOLACTONE 25 MG PO TABS
25.0000 mg | ORAL_TABLET | Freq: Every day | ORAL | 3 refills | Status: DC
Start: 2021-10-17 — End: 2022-10-18

## 2021-10-17 MED ORDER — MELOXICAM 15 MG PO TABS: ORAL_TABLET | ORAL | 0 refills | Status: DC

## 2021-10-17 MED ORDER — SIMVASTATIN 20 MG PO TABS: 20.0000 mg | ORAL_TABLET | Freq: Every day | ORAL | 3 refills | Status: DC

## 2021-10-17 MED ORDER — TELMISARTAN-HCTZ 80-25 MG PO TABS
1.0000 | ORAL_TABLET | Freq: Every day | ORAL | 3 refills | Status: DC
Start: 2021-10-17 — End: 2022-10-02

## 2021-10-17 NOTE — Progress Notes (Signed)
Tonya Taylor is a 66 y.o. female with the following history as recorded in [employer]:  Patient Active Problem List   Diagnosis Date Noted   Back pain 03/20/2017   URI (upper respiratory infection) 07/24/2016   Vitamin D deficiency 03/16/2015   Wellness examination 03/01/2014   Hyperglycemia 02/20/2013   Menopausal state 03/05/2012   Abnormal ECG 02/19/2011   NASH (nonalcoholic steatohepatitis) 02/07/2010   NEUTROPENIA UNSPECIFIED 01/31/2009   Iron deficiency anemia due to chronic blood loss 04/05/2008   FOOT PAIN, RIGHT 04/05/2008   ANKLE PAIN, LEFT 01/02/2008   EDEMA 01/02/2008   Dyslipidemia 02/01/2007   Essential hypertension 02/01/2007   INSOMNIA 02/01/2007   TOBACCO USE, QUIT 02/01/2007    Current Outpatient Medications  Medication Sig Dispense Refill   Ascorbic Acid (VITAMIN C) 1000 MG tablet Take 1,000 mg by mouth daily.     Calcium Carbonate-Vit D-Min (CALCIUM 600+D PLUS MINERALS PO) Take 2 tablets by mouth daily.     cetirizine (ZYRTEC) 10 MG tablet Take 10 mg by mouth daily.     Cholecalciferol (VITAMIN D3) 50 MCG (2000 UT) TABS Take 50 mcg by mouth.     Coenzyme Q10 (COQ10 PO) Take 1 capsule by mouth daily.     [employer] Fiber Gummies 2 g CHEW Chew by mouth.     Cyanocobalamin (VITAMIN B-12) 5000 MCG TBDP Take 5,000 mcg by mouth daily.     docusate sodium (COLACE) 100 MG capsule Take 100 mg by mouth 2 (two) times daily.     ECHINACEA PO Take 1 capsule by mouth daily. 726m     fluticasone (FLONASE) 50 MCG/ACT nasal spray Place 2 sprays into both nostrils daily. 16 g 6   Multiple Vitamin (MULTIVITAMIN) tablet Take 1 tablet by mouth daily.     Omega-3 1400 MG CAPS Take 1 capsule by mouth daily.     OVER THE COUNTER MEDICATION Hair, skin, nails gummies- 1 po daily     Semaglutide (RYBELSUS) 14 MG TABS Take 1 tablet (14 mg total) by mouth daily. 90 tablet 0   Vitamin E 400 units TABS Take 400 Units by mouth daily.     meloxicam (MOBIC) 15 MG tablet Take 1 tablet daily as  directed 90 tablet 0   simvastatin (ZOCOR) 20 MG tablet Take 1 tablet (20 mg total) by mouth at bedtime. 90 tablet 3   spironolactone (ALDACTONE) 25 MG tablet Take 1 tablet (25 mg total) by mouth daily. 90 tablet 3   telmisartan-hydrochlorothiazide (MICARDIS HCT) 80-25 MG tablet Take 1 tablet by mouth daily. 90 tablet 3   No current facility-administered medications for this visit.    Allergies: Patient has no known allergies.  Past Medical History:  Diagnosis Date   Allergy    ANEMIA DUE TO CHRONIC BLOOD LOSS 04/05/2008   Edema 01/02/2008   Glaucoma    HYPERLIPIDEMIA 02/01/2007   HYPERTENSION 02/01/2007   INSOMNIA 02/01/2007   NEUTROPENIA UNSPECIFIED 01/31/2009   Obesity    Osteopenia    Other chronic nonalcoholic liver disease 161/10/735  TOBACCO USE, QUIT 02/01/2007    Past Surgical History:  Procedure Laterality Date   COLONOSCOPY     DENTAL SURGERY     HAND SURGERY     removed a nerve    KNEE ARTHROSCOPY Left    ORIF SCAPHOID FRACTURE Right 07/08/2017   Procedure: OPEN REDUCTION INTERNAL FIXATION OF RIGHT SCAPHOID FRACTURE;  Surgeon: TMilly Jakob MD;  Location: [facility]  Service: Orthopedics;  Laterality: Right;  Family History  Problem Relation Age of Onset   Cancer Mother        Lung Cancer   Lung cancer Mother    Prostate cancer Father    Cancer Father    Thyroid disease Father    Colon cancer Neg Hx    Esophageal cancer Neg Hx    Liver cancer Neg Hx    Pancreatic cancer Neg Hx    Rectal cancer Neg Hx    Stomach cancer Neg Hx     Social History   Tobacco Use   Smoking status: Former    Types: Cigarettes   Smokeless tobacco: Never  Substance Use Topics   Alcohol use: No    Alcohol/week: 0.0 standard drinks of alcohol    Subjective:   Presents for yearly CPE; no acute concerns today; does see GYN regularly; continuing to work with [facility]- will be doing complete lab panel with that office in July;   Health  Maintenance  Topic Date Due   PAP SMEAR-Modifier  10/19/2021   INFLUENZA VACCINE  12/05/2021   MAMMOGRAM  09/16/2022   TETANUS/TDAP  03/01/2024   COLONOSCOPY (Pts 45-68yr Insurance coverage will need to be confirmed)  05/25/2027   COVID-19 Vaccine  Completed   Hepatitis C Screening  Completed   HIV Screening  Completed   Zoster Vaccines- Shingrix  Completed   HPV VACCINES  Aged Out    Review of Systems  Constitutional: Negative.   HENT: Negative.    Eyes: Negative.   Respiratory: Negative.    Cardiovascular: Negative.   Gastrointestinal: Negative.   Genitourinary: Negative.   Musculoskeletal: Negative.   Skin: Negative.   Neurological: Negative.   Endo/Heme/Allergies: Negative.   Psychiatric/Behavioral: Negative.         Objective:  Vitals:   10/17/21 1512 10/17/21 1521  BP: (!) 162/84 134/72  Pulse: 63 63  Resp: 12   Temp: 98 F (36.7 C)   TempSrc: Oral   SpO2: 99%   Weight: 227 lb 12.8 oz (103.3 kg)   Height: 5' 7"  (1.702 m)     General: Well developed, well nourished, in no acute distress  Skin : Warm and dry.  Head: Normocephalic and atraumatic  Eyes: Sclera and conjunctiva clear; pupils round and reactive to light; extraocular movements intact  Ears: External normal; canals clear; tympanic membranes normal  Oropharynx: Pink, supple. No suspicious lesions  Neck: Supple without thyromegaly, adenopathy  Lungs: Respirations unlabored; clear to auscultation bilaterally without wheeze, rales, rhonchi  CVS exam: normal rate and regular rhythm.  Abdomen: Soft; nontender; nondistended; normoactive bowel sounds; no masses or hepatosplenomegaly  Musculoskeletal: No deformities; no active joint inflammation  Extremities: No edema, cyanosis, clubbing  Vessels: Symmetric bilaterally  Neurologic: Alert and oriented; speech intact; face symmetrical; moves all extremities well; CNII-XII intact without focal deficit  Assessment:  1. PE (physical exam), annual   2.  Essential hypertension     Plan:  Age appropriate preventive healthcare needs addressed; encouraged regular eye doctor and dental exams; encouraged regular exercise; will update refills as needed today; follow-up to be determined; EKG shows sinus rhythm; continue with GYN and weight loss specialist;   No follow-ups on file.  Orders Placed This Encounter  Procedures   EKG 12-Lead    Requested Prescriptions   Signed Prescriptions Disp Refills   meloxicam (MOBIC) 15 MG tablet 90 tablet 0    Sig: Take 1 tablet daily as directed   simvastatin (ZOCOR) 20 MG tablet 90  tablet 3    Sig: Take 1 tablet (20 mg total) by mouth at bedtime.   spironolactone (ALDACTONE) 25 MG tablet 90 tablet 3    Sig: Take 1 tablet (25 mg total) by mouth daily.   telmisartan-hydrochlorothiazide (MICARDIS HCT) 80-25 MG tablet 90 tablet 3    Sig: Take 1 tablet by mouth daily.

## 2021-10-29 ENCOUNTER — Other Ambulatory Visit (INDEPENDENT_AMBULATORY_CARE_PROVIDER_SITE_OTHER): Payer: Self-pay | Admitting: Family Medicine

## 2021-10-29 DIAGNOSIS — R7303 Prediabetes: Secondary | ICD-10-CM

## 2021-10-30 MED ORDER — RYBELSUS 14 MG PO TABS: 14.0000 mg | ORAL_TABLET | Freq: Every day | ORAL | 0 refills | Status: DC

## 2021-11-21 ENCOUNTER — Encounter (INDEPENDENT_AMBULATORY_CARE_PROVIDER_SITE_OTHER): Payer: Self-pay | Admitting: Family Medicine

## 2021-11-21 ENCOUNTER — Ambulatory Visit (INDEPENDENT_AMBULATORY_CARE_PROVIDER_SITE_OTHER): Payer: BC Managed Care – PPO | Admitting: Family Medicine

## 2021-11-21 VITALS — BP 113/73 | HR 74 | Temp 98.0°F | Ht 67.0 in | Wt 222.0 lb

## 2021-11-21 DIAGNOSIS — E559 Vitamin D deficiency, unspecified: Secondary | ICD-10-CM | POA: Diagnosis not present

## 2021-11-21 DIAGNOSIS — Z6837 Body mass index (BMI) 37.0-37.9, adult: Secondary | ICD-10-CM

## 2021-11-21 DIAGNOSIS — R0602 Shortness of breath: Secondary | ICD-10-CM

## 2021-11-21 DIAGNOSIS — Z6834 Body mass index (BMI) 34.0-34.9, adult: Secondary | ICD-10-CM

## 2021-11-21 DIAGNOSIS — E669 Obesity, unspecified: Secondary | ICD-10-CM

## 2021-11-21 DIAGNOSIS — R7303 Prediabetes: Secondary | ICD-10-CM | POA: Diagnosis not present

## 2021-11-21 DIAGNOSIS — E7849 Other hyperlipidemia: Secondary | ICD-10-CM

## 2021-11-21 MED ORDER — RYBELSUS 14 MG PO TABS: 14.0000 mg | ORAL_TABLET | Freq: Every day | ORAL | 0 refills | Status: DC

## 2021-11-22 LAB — LIPID PANEL WITH LDL/HDL RATIO
Cholesterol, Total: 153 mg/dL (ref 100–199)
HDL: 48 mg/dL (ref >39)
LDL Chol Calc (NIH): 89 mg/dL (ref 0–99)
LDL/HDL Ratio: 1.9 ratio (ref 0.0–3.2)
Triglycerides: 86 mg/dL (ref 0–149)
VLDL Cholesterol Cal: 16 mg/dL (ref 5–40)

## 2021-11-22 LAB — COMPREHENSIVE METABOLIC PANEL
ALT: 29 IU/L (ref 0–32)
AST: 31 IU/L (ref 0–40)
Albumin/Globulin Ratio: 1.4 (ref 1.2–2.2)
Albumin: 4.4 g/dL (ref 3.9–4.9)
Alkaline Phosphatase: 54 IU/L (ref 44–121)
BUN/Creatinine Ratio: 22 (ref 12–28)
BUN: 23 mg/dL (ref 8–27)
Bilirubin Total: 0.5 mg/dL (ref 0.0–1.2)
CO2: 24 mmol/L (ref 20–29)
Calcium: 9.5 mg/dL (ref 8.7–10.3)
Chloride: 98 mmol/L (ref 96–106)
Creatinine, Ser: 1.05 mg/dL — ABNORMAL HIGH (ref 0.57–1.00)
Globulin, Total: 3.1 g/dL (ref 1.5–4.5)
Glucose: 80 mg/dL (ref 70–99)
Potassium: 4.1 mmol/L (ref 3.5–5.2)
Sodium: 138 mmol/L (ref 134–144)
Total Protein: 7.5 g/dL (ref 6.0–8.5)
eGFR: 59 mL/min/{1.73_m2} — ABNORMAL LOW (ref >59)

## 2021-11-22 LAB — HEMOGLOBIN A1C
Est. average glucose Bld gHb Est-mCnc: 111 mg/dL
Hgb A1c MFr Bld: 5.5 % (ref 4.8–5.6)

## 2021-11-22 LAB — VITAMIN D 25 HYDROXY (VIT D DEFICIENCY, FRACTURES): Vit D, 25-Hydroxy: 40.3 ng/mL (ref 30.0–100.0)

## 2021-11-22 LAB — INSULIN, RANDOM: INSULIN: 18.4 u[IU]/mL (ref 2.6–24.9)

## 2021-11-23 NOTE — Progress Notes (Signed)
Chief Complaint:   OBESITY Tonya Taylor is here to discuss her progress with her obesity treatment plan along with follow-up of her obesity related diagnoses. Tonya Taylor is on the Category 3 Plan and states she is following her eating plan approximately 90% of the time. Tonya Taylor states she is walking 50 minutes 7 times per week.  Today's visit was #: 92 Starting weight: 237 lbs Starting date: 09/30/2019 Today's weight: 222 lbs Today's date: 11/21/2020 Total lbs lost to date: 15 lbs Total lbs lost since last in-office visit: 4  Interim History: Tonya Taylor returned from an [place] cruise recently and then had a trip to the beach. Next few weeks she has plans to stay home and do housework. She wants to start walking at night up and down her street. Feels tired of eating indulgently.    Subjective:   1. SOBOE (shortness of breath on exertion) Initially IC done 09/30/19 of 1715 with symptoms. Symptoms slightly improved.   2. Prediabetes Tonya Taylor is currently taking Rybelsus 14 mg with no GI side effects. Her last A1c in Dec 2022 of 5.6.  3. Other hyperlipidemia Tonya Taylor's last LDL of 134 ( prior 96), HDL of 45, and Trigly of 94.  4. Vitamin D deficiency Tonya Taylor is currently taking over the counter Vit D. Her last Vit D level of 34.6. She notes fatigue.  Assessment/Plan:   1. SOBOE (shortness of breath on exertion) IC today 1584--slight decrease likely in part due to recent indulgent eating.   2. Prediabetes We will obtain labs today. We will refill Rybelsus 14 mg by mouth daily for 1 month with 0 refills.  -Refill Semaglutide (RYBELSUS) 14 MG TABS; Take 1 tablet (14 mg total) by mouth daily.  Dispense: 30 tablet; Refill: 0  - Comprehensive metabolic panel - Hemoglobin A1c - Insulin, random  3. Other hyperlipidemia We will obtain labs today.  - Lipid Panel With LDL/HDL Ratio  4. Vitamin D deficiency We will obtain labs today.  - VITAMIN D 25 Hydroxy (Vit-D Deficiency,  Fractures)  5. Obesity, current BMI 34.8 Tonya Taylor is currently in the action stage of change. As such, her goal is to continue with weight loss efforts. She has agreed to the Category 3 Plan with 8 oz at supper.  Exercise goals: All adults should avoid inactivity. Some physical activity is better than none, and adults who participate in any amount of physical activity gain some health benefits.  Behavioral modification strategies: increasing lean protein intake, meal planning and cooking strategies, keeping healthy foods in the home, and planning for success.  Tonya Taylor has agreed to follow-up with our clinic in 4 weeks. She was informed of the importance of frequent follow-up visits to maximize her success with intensive lifestyle modifications for her multiple health conditions.   Tonya Taylor was informed we would discuss her lab results at her next visit unless there is a critical issue that needs to be addressed sooner. Tonya Taylor agreed to keep her next visit at the agreed upon time to discuss these results.  Objective:   Blood pressure 113/73, pulse 74, temperature 98 F (36.7 C), height 5' 7"  (1.702 m), weight 222 lb (100.7 kg), SpO2 98 %. Body mass index is 34.77 kg/m.  General: Cooperative, alert, well developed, in no acute distress. HEENT: Conjunctivae and lids unremarkable. Cardiovascular: Regular rhythm.  Lungs: Normal work of breathing. Neurologic: No focal deficits.   Lab Results  Component Value Date   CREATININE 1.05 (H) 11/21/2021   BUN 23 11/21/2021   NA  138 11/21/2021   K 4.1 11/21/2021   CL 98 11/21/2021   CO2 24 11/21/2021   Lab Results  Component Value Date   ALT 29 11/21/2021   AST 31 11/21/2021   ALKPHOS 54 11/21/2021   BILITOT 0.5 11/21/2021   Lab Results  Component Value Date   HGBA1C 5.5 11/21/2021   HGBA1C 5.6 04/25/2021   HGBA1C 5.7 (H) 08/22/2020   HGBA1C 5.6 09/30/2019   HGBA1C 5.6 03/19/2016   Lab Results  Component Value Date   INSULIN  18.4 11/21/2021   INSULIN 25.4 (H) 04/25/2021   INSULIN 16.8 08/22/2020   INSULIN 21.5 09/30/2019   Lab Results  Component Value Date   TSH 3.19 09/21/2019   Lab Results  Component Value Date   CHOL 153 11/21/2021   HDL 48 11/21/2021   LDLCALC 89 11/21/2021   TRIG 86 11/21/2021   CHOLHDL 3.3 08/22/2020   Lab Results  Component Value Date   VD25OH 40.3 11/21/2021   VD25OH 34.6 04/25/2021   VD25OH 30.3 08/22/2020   Lab Results  Component Value Date   WBC 4.5 05/19/2021   HGB 12.0 05/19/2021   HCT 37.5 05/19/2021   MCV 87.6 05/19/2021   PLT 213.0 05/19/2021   Lab Results  Component Value Date   IRON 61 03/20/2017   Attestation Statements:   Reviewed by clinician on day of visit: allergies, medications, problem list, medical history, surgical history, family history, social history, and previous encounter notes.  I, Elnora Morrison, RMA am acting as transcriptionist for Coralie Common, MD.  I have reviewed the above documentation for accuracy and completeness, and I agree with the above. - Coralie Common, MD

## 2021-11-27 ENCOUNTER — Encounter (INDEPENDENT_AMBULATORY_CARE_PROVIDER_SITE_OTHER): Payer: Self-pay | Admitting: Family Medicine

## 2021-12-08 ENCOUNTER — Other Ambulatory Visit: Payer: Self-pay | Admitting: Family

## 2021-12-13 ENCOUNTER — Encounter (INDEPENDENT_AMBULATORY_CARE_PROVIDER_SITE_OTHER): Payer: Self-pay

## 2021-12-19 ENCOUNTER — Ambulatory Visit (INDEPENDENT_AMBULATORY_CARE_PROVIDER_SITE_OTHER): Payer: BC Managed Care – PPO | Admitting: Family Medicine

## 2021-12-19 ENCOUNTER — Encounter (INDEPENDENT_AMBULATORY_CARE_PROVIDER_SITE_OTHER): Payer: Self-pay | Admitting: Family Medicine

## 2021-12-19 VITALS — BP 129/82 | HR 72 | Temp 97.8°F | Ht 67.0 in | Wt 223.0 lb

## 2021-12-19 DIAGNOSIS — E559 Vitamin D deficiency, unspecified: Secondary | ICD-10-CM | POA: Diagnosis not present

## 2021-12-19 DIAGNOSIS — Z6834 Body mass index (BMI) 34.0-34.9, adult: Secondary | ICD-10-CM

## 2021-12-19 DIAGNOSIS — R7303 Prediabetes: Secondary | ICD-10-CM

## 2021-12-19 DIAGNOSIS — E7849 Other hyperlipidemia: Secondary | ICD-10-CM | POA: Diagnosis not present

## 2021-12-19 DIAGNOSIS — Z7984 Long term (current) use of oral hypoglycemic drugs: Secondary | ICD-10-CM

## 2021-12-19 DIAGNOSIS — E669 Obesity, unspecified: Secondary | ICD-10-CM

## 2021-12-19 MED ORDER — RYBELSUS 14 MG PO TABS: 14.0000 mg | ORAL_TABLET | Freq: Every day | ORAL | 0 refills | Status: DC

## 2021-12-19 MED ORDER — VITAMIN D3 50 MCG (2000 UT) PO TABS: 100.0000 ug | ORAL_TABLET | Freq: Every day | ORAL | Status: AC

## 2021-12-26 NOTE — Progress Notes (Signed)
Chief Complaint:   OBESITY Tonya Taylor is here to discuss her progress with her obesity treatment plan along with follow-up of her obesity related diagnoses. Tonya Taylor is on the Category 3 Plan and states she is following her eating plan approximately 90% of the time. Tonya Taylor states she is walking 10 minutes 2-3 times per week.  Today's visit was #: 36 Starting weight: 237 lbs Starting date: 09/30/2019 Today's weight: 223 lbs Today's date: 12/19/2021 Total lbs lost to date: 14 lbs Total lbs lost since last in-office visit: 0  Interim History: Tonya Taylor walked less over the last few weeks due to heat. She voices she has drank more water. She has been more indulgent with her snacking, learned to make ice cream at home. Work restarts in 2 days. She wants to get back into walking more.  Subjective:   1. Other hyperlipidemia Labs discussed during visit today. Tonya Taylor's LDL improved to 89 from 134. She is on Zocor.  2. Prediabetes Tonya Taylor's A1c was 5.5, insulin was 18.4. She is on Rybelsus. Denies GI side effects.  3. Vitamin D deficiency Labs discussed during visit today. Tonya Taylor is currently on 2K IU daily. She notes fatigue. Her vit D level of 40.  Assessment/Plan:   1. Other hyperlipidemia Tonya Taylor will continue taking Zocor and stay on Cat plan.  2. Prediabetes We will refill Rybelsus 14 mg by mouth every AM for 1 month with 0 refills.  -Refill Semaglutide (RYBELSUS) 14 MG TABS; Take 1 tablet (14 mg total) by mouth daily.  Dispense: 30 tablet; Refill: 0  3. Vitamin D deficiency We will refill/increase Vit D 4K IU daily for 1 month with 0 refills.  -Refill/Increase Cholecalciferol (VITAMIN D3) 50 MCG (2000 UT) TABS; Take 100 mcg by mouth daily.  Dispense: 30 tablet  4. Obesity, current BMI 34.9 Tonya Taylor is currently in the action stage of change. As such, her goal is to continue with weight loss efforts. She has agreed to the Category 3 Plan.   Exercise goals: All  adults should avoid inactivity. Some physical activity is better than none, and adults who participate in any amount of physical activity gain some health benefits.  Behavioral modification strategies: increasing lean protein intake, meal planning and cooking strategies, keeping healthy foods in the home, and planning for success.  Tonya Taylor has agreed to follow-up with our clinic in 4 weeks. She was informed of the importance of frequent follow-up visits to maximize her success with intensive lifestyle modifications for her multiple health conditions.   Objective:   Blood pressure 129/82, pulse 72, temperature 97.8 F (36.6 C), height 5' 7"  (1.702 m), weight 223 lb (101.2 kg), SpO2 99 %. Body mass index is 34.93 kg/m.  General: Cooperative, alert, well developed, in no acute distress. HEENT: Conjunctivae and lids unremarkable. Cardiovascular: Regular rhythm.  Lungs: Normal work of breathing. Neurologic: No focal deficits.   Lab Results  Component Value Date   CREATININE 1.05 (H) 11/21/2021   BUN 23 11/21/2021   NA 138 11/21/2021   K 4.1 11/21/2021   CL 98 11/21/2021   CO2 24 11/21/2021   Lab Results  Component Value Date   ALT 29 11/21/2021   AST 31 11/21/2021   ALKPHOS 54 11/21/2021   BILITOT 0.5 11/21/2021   Lab Results  Component Value Date   HGBA1C 5.5 11/21/2021   HGBA1C 5.6 04/25/2021   HGBA1C 5.7 (H) 08/22/2020   HGBA1C 5.6 09/30/2019   HGBA1C 5.6 03/19/2016   Lab Results  Component Value  Date   INSULIN 18.4 11/21/2021   INSULIN 25.4 (H) 04/25/2021   INSULIN 16.8 08/22/2020   INSULIN 21.5 09/30/2019   Lab Results  Component Value Date   TSH 3.19 09/21/2019   Lab Results  Component Value Date   CHOL 153 11/21/2021   HDL 48 11/21/2021   LDLCALC 89 11/21/2021   TRIG 86 11/21/2021   CHOLHDL 3.3 08/22/2020   Lab Results  Component Value Date   VD25OH 40.3 11/21/2021   VD25OH 34.6 04/25/2021   VD25OH 30.3 08/22/2020   Lab Results  Component Value  Date   WBC 4.5 05/19/2021   HGB 12.0 05/19/2021   HCT 37.5 05/19/2021   MCV 87.6 05/19/2021   PLT 213.0 05/19/2021   Lab Results  Component Value Date   IRON 61 03/20/2017   Attestation Statements:   Reviewed by clinician on day of visit: allergies, medications, problem list, medical history, surgical history, family history, social history, and previous encounter notes.  I, Elnora Morrison, RMA am acting as transcriptionist for Coralie Common, MD.  I have reviewed the above documentation for accuracy and completeness, and I agree with the above. - Coralie Common, MD

## 2021-12-28 ENCOUNTER — Other Ambulatory Visit: Payer: Self-pay | Admitting: Family

## 2022-01-04 ENCOUNTER — Encounter (INDEPENDENT_AMBULATORY_CARE_PROVIDER_SITE_OTHER): Payer: Self-pay

## 2022-01-04 ENCOUNTER — Telehealth (INDEPENDENT_AMBULATORY_CARE_PROVIDER_SITE_OTHER): Payer: Self-pay | Admitting: Family Medicine

## 2022-01-04 NOTE — Telephone Encounter (Signed)
Dr. Jearld Shines - Prior authorization denied for Rybelsus. Per insurance: Patient does not have type 2 diabetes. Patient sent denial message via mychart.

## 2022-02-13 ENCOUNTER — Ambulatory Visit (INDEPENDENT_AMBULATORY_CARE_PROVIDER_SITE_OTHER): Payer: BC Managed Care – PPO | Admitting: Family Medicine

## 2022-03-19 ENCOUNTER — Ambulatory Visit (INDEPENDENT_AMBULATORY_CARE_PROVIDER_SITE_OTHER): Payer: BC Managed Care – PPO | Admitting: Family Medicine

## 2022-03-19 VITALS — BP 139/76 | HR 82 | Temp 98.2°F | Ht 67.0 in | Wt 229.0 lb

## 2022-03-19 DIAGNOSIS — E669 Obesity, unspecified: Secondary | ICD-10-CM

## 2022-03-19 DIAGNOSIS — Z6836 Body mass index (BMI) 36.0-36.9, adult: Secondary | ICD-10-CM

## 2022-03-19 DIAGNOSIS — I10 Essential (primary) hypertension: Secondary | ICD-10-CM

## 2022-03-20 ENCOUNTER — Encounter (INDEPENDENT_AMBULATORY_CARE_PROVIDER_SITE_OTHER): Payer: Self-pay | Admitting: Family Medicine

## 2022-04-03 NOTE — Progress Notes (Signed)
Chief Complaint:   OBESITY Tonya Taylor is here to discuss her progress with her obesity treatment plan along with follow-up of her obesity related diagnoses. Tonya Taylor is on the Category 3 Plan and states she is following her eating plan approximately 90% of the time. Tonya Taylor states she is walking 30 minutes 4 times per week.  Today's visit was #: 52 Starting weight: 237 lbs Starting date: 09/30/2019 Today's weight: 229 lbs Today's date: 03/19/2022 Total lbs lost to date: 8 lbs Total lbs lost since last in-office visit: 0  Interim History: Tonya Taylor is no longer can get Rybelsus as insurance no longer covers it. She has been indulgent--more than previously. Recognizes she does snack more. Recognizes she is not mentally in a space to commit to program.  Subjective:   1. Essential hypertension Tonya Taylor's blood pressure controlled today. Denies chest pain, chest pressure and headache. She is on Micardis hctz, Aldactone.  Assessment/Plan:   1. Essential hypertension Will follow up with blood pressure at next appointment.  2. Obesity, current BMI 36.0 Tonya Taylor is currently in the action stage of change. As such, her goal is to continue with weight loss efforts. She has agreed to the Category 3 Plan.   Exercise goals: All adults should avoid inactivity. Some physical activity is better than none, and adults who participate in any amount of physical activity gain some health benefits.  Behavioral modification strategies: increasing lean protein intake, meal planning and cooking strategies, keeping healthy foods in the home, and planning for success.  Tonya Taylor has agreed to follow-up with our clinic in 8 weeks. She was informed of the importance of frequent follow-up visits to maximize her success with intensive lifestyle modifications for her multiple health conditions.   Objective:   Blood pressure 139/76, pulse 82, temperature 98.2 F (36.8 C), height 5' 7"  (1.702 m), weight 229 lb  (103.9 kg), SpO2 99 %. Body mass index is 35.87 kg/m.  General: Cooperative, alert, well developed, in no acute distress. HEENT: Conjunctivae and lids unremarkable. Cardiovascular: Regular rhythm.  Lungs: Normal work of breathing. Neurologic: No focal deficits.   Lab Results  Component Value Date   CREATININE 1.05 (H) 11/21/2021   BUN 23 11/21/2021   NA 138 11/21/2021   K 4.1 11/21/2021   CL 98 11/21/2021   CO2 24 11/21/2021   Lab Results  Component Value Date   ALT 29 11/21/2021   AST 31 11/21/2021   ALKPHOS 54 11/21/2021   BILITOT 0.5 11/21/2021   Lab Results  Component Value Date   HGBA1C 5.5 11/21/2021   HGBA1C 5.6 04/25/2021   HGBA1C 5.7 (H) 08/22/2020   HGBA1C 5.6 09/30/2019   HGBA1C 5.6 03/19/2016   Lab Results  Component Value Date   INSULIN 18.4 11/21/2021   INSULIN 25.4 (H) 04/25/2021   INSULIN 16.8 08/22/2020   INSULIN 21.5 09/30/2019   Lab Results  Component Value Date   TSH 3.19 09/21/2019   Lab Results  Component Value Date   CHOL 153 11/21/2021   HDL 48 11/21/2021   LDLCALC 89 11/21/2021   TRIG 86 11/21/2021   CHOLHDL 3.3 08/22/2020   Lab Results  Component Value Date   VD25OH 40.3 11/21/2021   VD25OH 34.6 04/25/2021   VD25OH 30.3 08/22/2020   Lab Results  Component Value Date   WBC 4.5 05/19/2021   HGB 12.0 05/19/2021   HCT 37.5 05/19/2021   MCV 87.6 05/19/2021   PLT 213.0 05/19/2021   Lab Results  Component Value Date  IRON 61 03/20/2017   Attestation Statements:   Reviewed by clinician on day of visit: allergies, medications, problem list, medical history, surgical history, family history, social history, and previous encounter notes.  I, Elnora Morrison, RMA am acting as transcriptionist for Coralie Common, MD.  I have reviewed the above documentation for accuracy and completeness, and I agree with the above. - Coralie Common, MD

## 2022-04-22 ENCOUNTER — Other Ambulatory Visit: Payer: Self-pay | Admitting: Family

## 2022-05-21 ENCOUNTER — Ambulatory Visit (INDEPENDENT_AMBULATORY_CARE_PROVIDER_SITE_OTHER): Payer: BC Managed Care – PPO | Admitting: Internal Medicine

## 2022-05-21 ENCOUNTER — Encounter (INDEPENDENT_AMBULATORY_CARE_PROVIDER_SITE_OTHER): Payer: Self-pay | Admitting: Internal Medicine

## 2022-05-21 VITALS — BP 130/82 | HR 68 | Temp 98.7°F | Ht 67.0 in | Wt 236.0 lb

## 2022-05-21 DIAGNOSIS — I10 Essential (primary) hypertension: Secondary | ICD-10-CM | POA: Diagnosis not present

## 2022-05-21 DIAGNOSIS — N183 Chronic kidney disease, stage 3 unspecified: Secondary | ICD-10-CM | POA: Insufficient documentation

## 2022-05-21 DIAGNOSIS — Z6836 Body mass index (BMI) 36.0-36.9, adult: Secondary | ICD-10-CM

## 2022-05-21 DIAGNOSIS — R7303 Prediabetes: Secondary | ICD-10-CM | POA: Diagnosis not present

## 2022-05-21 DIAGNOSIS — R944 Abnormal results of kidney function studies: Secondary | ICD-10-CM | POA: Insufficient documentation

## 2022-05-21 DIAGNOSIS — E669 Obesity, unspecified: Secondary | ICD-10-CM | POA: Diagnosis not present

## 2022-05-21 NOTE — Progress Notes (Deleted)
Office: [phone]  /  Fax: [phone]  HPI  Chief Complaint: OBESITY  Tonya Taylor is here to discuss her progress with her obesity treatment plan. She is on the {MWMwtlossportion/plan2:23431} and states she is following her eating plan approximately *** % of the time. She states she is exercising *** minutes *** times per week.  Today's visit was # {Numbers; 1-69:67893}  Starting weight: *** Starting date: *** Today's weight : *** Today's date: 05/21/2022 Total lbs lost to date: *** Total lbs lost since last in-office visit: ***  Interval History: Since last office she reports {EMADHERENCE:28838::"good"} adherence to prescribed reduced calorie nutritional plan. Physical activity is described as {EMPARange:28839} minutes a week. {ACTIONS;DENIES/REPORTS:21021675::"Denies"} problems with appetite and hunger signals. {ACTIONS;DENIES/REPORTS:21021675::"Denies"} problems with satiety and satiation. {ACTIONS;DENIES/REPORTS:21021675::"Denies"} problems with eating patterns and portion control. Sleeping approximately {Numbers; 1-10:17898} hours a day. Sleep described as {EMSLEEPREFR:28840}. Stress levels are reported as {EMSTRESS:28843::"low and manageable"}. Barriers identified {EMOBESITYBARRIERS:28841::"none"}.  Pharmacotherapy: On {EMPharmaco:28845::"none"}.  PHYSICAL EXAM:  Blood pressure 130/82, pulse 68, temperature 98.7 F (37.1 C), height '5\' 7"'$  (1.702 m), weight 236 lb (107 kg), SpO2 98 %. Body mass index is 36.96 kg/m.  General: She is overweight, cooperative, alert, well developed, and in no acute distress. PSYCH: Has normal mood, affect and thought process.   HEENT: EOMI, sclerae are anicteric. Lungs: Normal breathing effort, no conversational dyspnea. Extremities: No edema.  Neurologic: No gross sensory or motor deficits. No tremors or fasciculations noted.    DIAGNOSTIC DATA REVIEWED:  BMET    Component Value Date/Time   NA 138 11/21/2021 0815   K 4.1 11/21/2021 0815    CL 98 11/21/2021 0815   CO2 24 11/21/2021 0815   GLUCOSE 80 11/21/2021 0815   GLUCOSE 76 05/19/2021 1212   BUN 23 11/21/2021 0815   CREATININE 1.05 (H) 11/21/2021 0815   CREATININE 0.91 02/29/2012 0824   CALCIUM 9.5 11/21/2021 0815   GFRNONAA >60 07/05/2017 0938   GFRAA >60 07/05/2017 0938   Lab Results  Component Value Date   HGBA1C 5.5 11/21/2021   HGBA1C 5.6 03/01/2014   Lab Results  Component Value Date   INSULIN 18.4 11/21/2021   INSULIN 21.5 09/30/2019   CBC    Component Value Date/Time   WBC 4.5 05/19/2021 1212   RBC 4.29 05/19/2021 1212   HGB 12.0 05/19/2021 1212   HGB 13.2 02/16/2009 1341   HCT 37.5 05/19/2021 1212   HCT 39.2 02/16/2009 1341   PLT 213.0 05/19/2021 1212   PLT 212 02/16/2009 1341   MCV 87.6 05/19/2021 1212   MCV 86.0 02/16/2009 1341   MCH 28.5 04/22/2012 1940   MCHC 32.1 05/19/2021 1212   RDW 13.5 05/19/2021 1212   RDW 13.1 02/16/2009 1341   Iron/TIBC/Ferritin/ %Sat    Component Value Date/Time   IRON 61 03/20/2017 1654   IRONPCTSAT 17.6 (L) 03/20/2017 1654   Lipid Panel     Component Value Date/Time   CHOL 153 11/21/2021 0815   TRIG 86 11/21/2021 0815   HDL 48 11/21/2021 0815   CHOLHDL 3.3 08/22/2020 1026   CHOLHDL 3 09/21/2019 1627   VLDL 18.8 09/21/2019 1627   LDLCALC 89 11/21/2021 0815   Hepatic Function Panel     Component Value Date/Time   PROT 7.5 11/21/2021 0815   ALBUMIN 4.4 11/21/2021 0815   AST 31 11/21/2021 0815   ALT 29 11/21/2021 0815   ALKPHOS 54 11/21/2021 0815   BILITOT 0.5 11/21/2021 0815   BILIDIR 0.1 03/20/2017 1654   IBILI 0.4  02/29/2012 0824      Component Value Date/Time   TSH 3.19 09/21/2019 1627     ASSESSMENT AND PLAN  TREATMENT PLAN FOR OBESITY:  Recommended Dietary Goals  Tonya Taylor is currently in the action stage of change. As such, her goal is to {MWMwtloss#1:210800005}. She has agreed to {MWMwtlossportion/plan2:23431}.  Behavioral Intervention  We discussed the following  Behavioral Modification Strategies today: {MWMwtlossdietstrategies3:23432}.  Additional resources provided today: ***  Recommended Physical Activity Goals  Tonya Taylor has been instructed to work up to 150 minutes of moderate intensity aerobic activity a week and strengthening exercises 2-3 times per week for cardiovascular health, weight loss maintenance and preservation of muscle mass.   She has agreed to {EMEXERCISE:28847}  Pharmacotherapy We discussed various medication options to help Tonya Taylor with her weight loss efforts and we both agreed to ***.  ADDRESSED ASSOCIATED COMORBID CONDITIONS  1. Obesity, current BMI 36.0 ***  2. Essential hypertension ***  3. Prediabetes ***    Tonya Taylor has agreed to follow-up with our clinic in {NUMBER 1-10:22536} weeks. She was informed of the importance of frequent follow-up visits to maximize her success with intensive lifestyle modifications for her multiple health conditions.  Thomes Dinning, MD

## 2022-05-21 NOTE — Progress Notes (Signed)
Chief Complaint:   OBESITY Tonya Taylor is here to discuss her progress with her obesity treatment plan along with follow-up of her obesity related diagnoses. Tonya Taylor is on the Category 3 Plan and states she is following her eating plan approximately 90% of the time. Any states she is walking 10,000 steps 7 times per week, and on the elliptical for 30 minutes 4 times per week.  Today's visit was #: 19 Starting weight: 237 lbs Starting date: 09/30/2019 Today's weight: 236 lbs Today's date: 05/21/2022 Total lbs lost to date: 1 Total lbs lost since last in-office visit: 0  Interim History: This is my first encounter with Tonya Taylor.  Since last office visit she has gained 7 pounds.  She reports following meal plan but at the same time acknowledges having bad eating habits.  She has coffee yogurt for breakfast.  Has popcorn for lunch and eats a sandwich around 4 when she gets home from work.  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  She also craves junk food.  She reports cutting down on candy.  She reports getting adequate sleep and reports stress levels as low and manageable.  She had been on Rybelsus which have been prescribed off label and medication is no longer being covered by her insurance.   Subjective:   1. Essential hypertension Blood pressure at goal for age and risk category.  On spironolactone, telmisartan hydrochlorothiazide without adverse effects.  Most recent renal parameters reviewed which showed a decrease in GFR.  These have not been repeated recommend a repeat CMP at her next follow-up.  2. Prediabetes She had an A1c of 5.7 about a year ago with elevated insulin levels most recently was 5.5. Patient informed of disease state and risk of progression. This may contribute to abnormal cravings, fatigue and diabetes complications without having diabetes.   3. Decreased GFR Had normal baseline GFR in 2022.  This has not been  repeated.  May be related to ARB and hydrochlorothiazide.  Assessment/Plan:   1. Essential hypertension Continue with weight loss therapy.  Monitor for symptoms of orthostasis while losing weight. Continue current regimen and home monitoring for a goal blood pressure of 120/80.  2. Prediabetes Weight loss of 7 to 10% may reduce the risk of progression.  Continue with weight loss therapy.  3. Decreased GFR Patient advised to increase fluid intake avoid nephrotoxins.  Recommend repeating a CMP in 4 weeks and if abnormal further workup by primary care team.  4. Obesity, current BMI 36.0 Tonya Taylor is currently in the action stage of change. As such, her goal is to continue with weight loss efforts. She has agreed to the Category 3 Plan.   Exercise goals: As is.  Behavioral modification strategies: increasing lean protein intake, decreasing simple carbohydrates, increasing water intake, no skipping meals, better snacking choices, and avoiding temptations.  Tonya Taylor has agreed to follow-up with our clinic in 3 to 4 weeks. She was informed of the importance of frequent follow-up visits to maximize her success with intensive lifestyle modifications for her multiple health conditions.   Objective:   Blood pressure 130/82, pulse 68, temperature 98.7 F (37.1 C), height '5\' 7"'$  (1.702 m), weight 236 lb (107 kg), SpO2 98 %. Body mass index is 36.96 kg/m.  General: Cooperative, alert, well developed, in no acute distress. HEENT: Conjunctivae and lids unremarkable. Cardiovascular: Regular rhythm.  Lungs: Normal work of breathing. Neurologic: No focal deficits.   Lab Results  Component Value Date   CREATININE 1.05 (H) 11/21/2021   BUN 23 11/21/2021   NA 138 11/21/2021   K 4.1 11/21/2021   CL 98 11/21/2021   CO2 24 11/21/2021   Lab Results  Component Value Date   ALT 29 11/21/2021   AST 31 11/21/2021   ALKPHOS 54 11/21/2021   BILITOT 0.5 11/21/2021   Lab Results  Component Value Date    HGBA1C 5.5 11/21/2021   HGBA1C 5.6 04/25/2021   HGBA1C 5.7 (H) 08/22/2020   HGBA1C 5.6 09/30/2019   HGBA1C 5.6 03/19/2016   Lab Results  Component Value Date   INSULIN 18.4 11/21/2021   INSULIN 25.4 (H) 04/25/2021   INSULIN 16.8 08/22/2020   INSULIN 21.5 09/30/2019   Lab Results  Component Value Date   TSH 3.19 09/21/2019   Lab Results  Component Value Date   CHOL 153 11/21/2021   HDL 48 11/21/2021   LDLCALC 89 11/21/2021   TRIG 86 11/21/2021   CHOLHDL 3.3 08/22/2020   Lab Results  Component Value Date   VD25OH 40.3 11/21/2021   VD25OH 34.6 04/25/2021   VD25OH 30.3 08/22/2020   Lab Results  Component Value Date   WBC 4.5 05/19/2021   HGB 12.0 05/19/2021   HCT 37.5 05/19/2021   MCV 87.6 05/19/2021   PLT 213.0 05/19/2021   Lab Results  Component Value Date   IRON 61 03/20/2017   Attestation Statements:   Reviewed by clinician on day of visit: allergies, medications, problem list, medical history, surgical history, family history, social history, and previous encounter notes.   Wilhemena Durie, am acting as transcriptionist for Thomes Dinning, MD.  I have reviewed the above documentation for accuracy and completeness, and I agree with the above. -Thomes Dinning, MD

## 2022-06-18 ENCOUNTER — Encounter (INDEPENDENT_AMBULATORY_CARE_PROVIDER_SITE_OTHER): Payer: Self-pay | Admitting: Family Medicine

## 2022-06-18 ENCOUNTER — Ambulatory Visit (INDEPENDENT_AMBULATORY_CARE_PROVIDER_SITE_OTHER): Payer: BC Managed Care – PPO | Admitting: Family Medicine

## 2022-06-18 VITALS — BP 150/86 | HR 75 | Temp 98.7°F | Ht 67.0 in | Wt 233.0 lb

## 2022-06-18 DIAGNOSIS — E669 Obesity, unspecified: Secondary | ICD-10-CM

## 2022-06-18 DIAGNOSIS — E7849 Other hyperlipidemia: Secondary | ICD-10-CM | POA: Diagnosis not present

## 2022-06-18 DIAGNOSIS — I10 Essential (primary) hypertension: Secondary | ICD-10-CM | POA: Diagnosis not present

## 2022-06-18 DIAGNOSIS — Z6836 Body mass index (BMI) 36.0-36.9, adult: Secondary | ICD-10-CM | POA: Diagnosis not present

## 2022-06-18 NOTE — Progress Notes (Deleted)
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  She realizes her eating habits have gotten out of control.  Goal is 10k steps daily and 3 of the 27.3oz of water a day.  Husband's diabetes has gotten better controlled recently too.  Lunch is mostly popcorn, a fairlife shake and a sandwich when she gets home.  She may also have an apple.  Supper is variable- sometimes steak with sweet potato or [place] sausages with tangerine oranges (canned) with light syrup.  She didn't overeat yesterday either.  Breakfast can stay the same for the next few weeks and the same as lunch.

## 2022-06-28 NOTE — Progress Notes (Signed)
Chief Complaint:   OBESITY Tonya Taylor is here to discuss her progress with her obesity treatment plan along with follow-up of her obesity related diagnoses. Tonya Taylor is on the Category 3 Plan and states she is following her eating plan approximately 90% of the time. Tonya Taylor states she is walking 10,000 steps 5 times per week.  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  She realizes her eating habits have gotten out of control.  Goal is 10k steps daily and 3 of the 27.3oz of water a day.  Husband's diabetes has gotten better controlled recently too.  Lunch is mostly popcorn, a fairlife shake and a sandwich when she gets home.  She may also have an apple.  Supper is variable- sometimes steak with sweet potato or [place] sausages with tangerine oranges (canned) with light syrup.  She didn't overeat yesterday either.  Breakfast can stay the same for the next few weeks and the same as lunch.    Subjective:   1. Essential hypertension Blood pressure very elevated today.  Significant stress and frustration at work.  On Micardis-hct and Aldactone.  2. Other hyperlipidemia LDL improved to 89 at last check.  Not on medications.  Assessment/Plan:   1. Essential hypertension Will follow-up with blood pressure at next appointment.  2. Other hyperlipidemia Will follow-up with labs with PCP in March.  3. BMI 36.0-36.9,adult  4. Obesity with starting BMI of 37.12 Tonya Taylor is currently in the action stage of change. As such, her goal is to continue with weight loss efforts. She has agreed to the Category 3 Plan.   Exercise goals: Older adults should follow the adult guidelines. When older adults cannot meet the adult guidelines, they  should be as physically active as their abilities and conditions will allow.   Behavioral modification strategies: increasing lean protein intake, meal planning and cooking strategies, keeping healthy foods in the home, and planning for success.  Tonya Taylor has agreed to follow-up with our clinic in 4 weeks. She was informed of the importance of frequent follow-up visits to maximize her success with intensive lifestyle modifications for her multiple health conditions.   Objective:   Blood pressure (!) 150/86, pulse 75, temperature 98.7 F (37.1 C), height '5\' 7"'$  (1.702 m), weight 233 lb (105.7 kg), SpO2 99 %. Body mass index is 36.49 kg/m.  General: Cooperative, alert, well developed, in no acute distress. HEENT: Conjunctivae and lids unremarkable. Cardiovascular: Regular rhythm.  Lungs: Normal work of breathing. Neurologic: No focal deficits.   Lab Results  Component Value Date   CREATININE 1.05 (H) 11/21/2021   BUN 23 11/21/2021   NA 138 11/21/2021   K 4.1 11/21/2021   CL 98 11/21/2021   CO2 24 11/21/2021   Lab Results  Component Value Date   ALT 29 11/21/2021   AST 31 11/21/2021   ALKPHOS 54 11/21/2021   BILITOT 0.5 11/21/2021   Lab Results  Component Value Date   HGBA1C 5.5 11/21/2021   HGBA1C 5.6 04/25/2021   HGBA1C 5.7 (H) 08/22/2020   HGBA1C 5.6 09/30/2019   HGBA1C 5.6 03/19/2016   Lab Results  Component Value Date   INSULIN 18.4 11/21/2021   INSULIN 25.4 (H) 04/25/2021   INSULIN 16.8 08/22/2020   INSULIN  21.5 09/30/2019   Lab Results  Component Value Date   TSH 3.19 09/21/2019   Lab Results  Component Value Date   CHOL 153 11/21/2021   HDL 48 11/21/2021   LDLCALC 89 11/21/2021   TRIG 86 11/21/2021   CHOLHDL 3.3 08/22/2020   Lab Results  Component Value Date   VD25OH 40.3 11/21/2021   VD25OH 34.6 04/25/2021   VD25OH 30.3 08/22/2020   Lab Results  Component Value Date   WBC 4.5 05/19/2021   HGB 12.0 05/19/2021   HCT 37.5 05/19/2021   MCV  87.6 05/19/2021   PLT 213.0 05/19/2021   Lab Results  Component Value Date   IRON 61 03/20/2017   Attestation Statements:   Reviewed by clinician on day of visit: allergies, medications, problem list, medical history, surgical history, family history, social history, and previous encounter notes.  I, Elnora Morrison, RMA am acting as transcriptionist for Coralie Common, MD.  I have reviewed the above documentation for accuracy and completeness, and I agree with the above. - Coralie Common, MD

## 2022-07-16 ENCOUNTER — Ambulatory Visit (INDEPENDENT_AMBULATORY_CARE_PROVIDER_SITE_OTHER): Payer: BC Managed Care – PPO | Admitting: Family Medicine

## 2022-07-21 ENCOUNTER — Other Ambulatory Visit: Payer: Self-pay | Admitting: Family

## 2022-07-31 ENCOUNTER — Encounter (INDEPENDENT_AMBULATORY_CARE_PROVIDER_SITE_OTHER): Payer: Self-pay | Admitting: Family Medicine

## 2022-07-31 ENCOUNTER — Ambulatory Visit (INDEPENDENT_AMBULATORY_CARE_PROVIDER_SITE_OTHER): Payer: BC Managed Care – PPO | Admitting: Family Medicine

## 2022-07-31 VITALS — BP 157/82 | HR 67 | Temp 97.9°F | Ht 67.0 in | Wt 234.0 lb

## 2022-07-31 DIAGNOSIS — E669 Obesity, unspecified: Secondary | ICD-10-CM

## 2022-07-31 DIAGNOSIS — E7849 Other hyperlipidemia: Secondary | ICD-10-CM

## 2022-07-31 DIAGNOSIS — I10 Essential (primary) hypertension: Secondary | ICD-10-CM

## 2022-07-31 DIAGNOSIS — Z6836 Body mass index (BMI) 36.0-36.9, adult: Secondary | ICD-10-CM

## 2022-07-31 NOTE — Progress Notes (Signed)
Chief Complaint:   OBESITY Tonya Taylor is here to discuss her progress with her obesity treatment plan along with follow-up of her obesity related diagnoses. Tonya Taylor is on the Category 3 Plan and states she is following her eating plan approximately 90% of the time. Tonya Taylor states she is 10,000 steps 5 times per week.  Today's visit was #: 77 Starting weight: 237 lbs Starting date: 09/30/2019 Today's weight: 234 lbs Today's date: 07/31/2022 Total lbs lost to date: 3 lbs Total lbs lost since last in-office visit: +1 lb  Interim History: Patient has been following meal plan 90% of the time.  She is tired of eggs.  She is trying to get all the food in but thinks she is light on protein.  She is getting at least 90% of her protein in.  Typical day includes three eggs for breakfast with an apple and a protein shake for lunch and orange slices.  Food at home is tositos and a sandwich with 4oz of protein.  Dinner is [place] sausages or steak salad.  Snack calories are popcorn.  Does get in full 8oz of protein.  Next few weeks she is going out of town to [place] to see grandbaby.  Subjective:   1. Other hyperlipidemia Patient is on Zocor 20 mg daily.  No side effects noted.  2. Essential hypertension Patient blood pressure little elevated today.  Patient denies chest pain, chest pressure, headache.  Patient is feeling stress from work.  Patient is taking Aldactone, Micardis, HCT.  Assessment/Plan:   1. Other hyperlipidemia Continue Zocor.  2. Essential hypertension Follow-up blood pressure next appointment.  No change in medications.  3. BMI 36.0-36.9,adult  4. Obesity with starting BMI of 37.1 Tonya Taylor is currently in the action stage of change. As such, her goal is to continue with weight loss efforts. She has agreed to the Category 2 Plan +100-calorie.   Exercise goals: All adults should avoid inactivity. Some physical activity is better than none, and adults who  participate in any amount of physical activity gain some health benefits.  Behavioral modification strategies: increasing lean protein intake, meal planning and cooking strategies, keeping healthy foods in the home, and planning for success.  Tonya Taylor has agreed to follow-up with our clinic in 4 weeks. She was informed of the importance of frequent follow-up visits to maximize her success with intensive lifestyle modifications for her multiple health conditions.   Objective:   Blood pressure (!) 157/82, pulse 67, temperature 97.9 F (36.6 C), height 5\' 7"  (1.702 m), weight 234 lb (106.1 kg), SpO2 99 %. Body mass index is 36.65 kg/m.  General: Cooperative, alert, well developed, in no acute distress. HEENT: Conjunctivae and lids unremarkable. Cardiovascular: Regular rhythm.  Lungs: Normal work of breathing. Neurologic: No focal deficits.   Lab Results  Component Value Date   CREATININE 1.05 (H) 11/21/2021   BUN 23 11/21/2021   NA 138 11/21/2021   K 4.1 11/21/2021   CL 98 11/21/2021   CO2 24 11/21/2021   Lab Results  Component Value Date   ALT 29 11/21/2021   AST 31 11/21/2021   ALKPHOS 54 11/21/2021   BILITOT 0.5 11/21/2021   Lab Results  Component Value Date   HGBA1C 5.5 11/21/2021   HGBA1C 5.6 04/25/2021   HGBA1C 5.7 (H) 08/22/2020   HGBA1C 5.6 09/30/2019   HGBA1C 5.6 03/19/2016   Lab Results  Component Value Date   INSULIN 18.4 11/21/2021   INSULIN 25.4 (H) 04/25/2021   INSULIN  16.8 08/22/2020   INSULIN 21.5 09/30/2019   Lab Results  Component Value Date   TSH 3.19 09/21/2019   Lab Results  Component Value Date   CHOL 153 11/21/2021   HDL 48 11/21/2021   LDLCALC 89 11/21/2021   TRIG 86 11/21/2021   CHOLHDL 3.3 08/22/2020   Lab Results  Component Value Date   VD25OH 40.3 11/21/2021   VD25OH 34.6 04/25/2021   VD25OH 30.3 08/22/2020   Lab Results  Component Value Date   WBC 4.5 05/19/2021   HGB 12.0 05/19/2021   HCT 37.5 05/19/2021   MCV 87.6  05/19/2021   PLT 213.0 05/19/2021   Lab Results  Component Value Date   IRON 61 03/20/2017   Attestation Statements:   Reviewed by clinician on day of visit: allergies, medications, problem list, medical history, surgical history, family history, social history, and previous encounter notes.  I, Davy Pique, RMA, am acting as transcriptionist for Coralie Common, MD.  I have reviewed the above documentation for accuracy and completeness, and I agree with the above. - Coralie Common, MD

## 2022-08-31 ENCOUNTER — Other Ambulatory Visit: Payer: Self-pay | Admitting: Family

## 2022-08-31 DIAGNOSIS — Z1231 Encounter for screening mammogram for malignant neoplasm of breast: Secondary | ICD-10-CM

## 2022-09-12 ENCOUNTER — Ambulatory Visit (INDEPENDENT_AMBULATORY_CARE_PROVIDER_SITE_OTHER): Payer: BC Managed Care – PPO | Admitting: Family Medicine

## 2022-10-01 ENCOUNTER — Other Ambulatory Visit: Payer: Self-pay | Admitting: Family

## 2022-10-02 ENCOUNTER — Ambulatory Visit
Admission: RE | Admit: 2022-10-02 | Discharge: 2022-10-02 | Disposition: A | Discharge status: Home / Self Care | Payer: BC Managed Care – PPO | Source: Ambulatory Visit

## 2022-10-02 DIAGNOSIS — Z1231 Encounter for screening mammogram for malignant neoplasm of breast: Secondary | ICD-10-CM

## 2022-10-03 ENCOUNTER — Encounter (INDEPENDENT_AMBULATORY_CARE_PROVIDER_SITE_OTHER): Payer: Self-pay | Admitting: Family Medicine

## 2022-10-03 ENCOUNTER — Ambulatory Visit (INDEPENDENT_AMBULATORY_CARE_PROVIDER_SITE_OTHER): Payer: BC Managed Care – PPO | Admitting: Family Medicine

## 2022-10-03 VITALS — BP 154/76 | HR 72 | Temp 98.3°F | Ht 67.0 in | Wt 236.0 lb

## 2022-10-03 DIAGNOSIS — Z6837 Body mass index (BMI) 37.0-37.9, adult: Secondary | ICD-10-CM

## 2022-10-03 DIAGNOSIS — I10 Essential (primary) hypertension: Secondary | ICD-10-CM

## 2022-10-03 DIAGNOSIS — E7849 Other hyperlipidemia: Secondary | ICD-10-CM

## 2022-10-03 DIAGNOSIS — Z6836 Body mass index (BMI) 36.0-36.9, adult: Secondary | ICD-10-CM | POA: Diagnosis not present

## 2022-10-03 DIAGNOSIS — E669 Obesity, unspecified: Secondary | ICD-10-CM | POA: Diagnosis not present

## 2022-10-03 NOTE — Progress Notes (Signed)
Chief Complaint:   OBESITY Tonya Taylor is here to discuss her progress with her obesity treatment plan along with follow-up of her obesity related diagnoses. Tonya Taylor is on the Category 2 Plan +100 and states she is following her eating plan approximately 50% of the time. Tonya Taylor states she is walking 30-40 minutes for times per week.  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 Her eating habits are all off.  She feels tired and not really able to commit consistently to the meal plan.  She feels very self conscious about food choices she is making and feels exhausted.    Subjective:   1. Other hyperlipidemia Patient is on Zocor 20 mg daily.  Patient denies transaminitis or myalgias.  2. Essential hypertension Patient's blood pressure well-controlled previously but elevated at the last few appointments.  Patient has been increasingly stressed.  Patient is on Micardis HCTZ and Aldactone.  Patient denies chest pain, chest pressure, headache.  Assessment/Plan:   1. Other hyperlipidemia Continue Zocor, no change in dose.  2. Essential hypertension Patient to reach out to PCP for further management.  3. BMI 37.0-37.9, adult  4. Obesity with starting BMI of 37.1 Tonya Taylor is currently in the action stage of change. As such, her goal is to continue with weight loss efforts. She has agreed to the Category 2 Plan +100 cal.   Exercise goals: All adults should avoid inactivity. Some physical activity is better than none, and adults who participate in any amount of physical activity gain some health benefits.  Behavioral modification strategies: increasing lean protein intake, meal planning and cooking strategies,  keeping healthy foods in the home, and planning for success.  Tonya Taylor has agreed to follow-up with our clinic in 5-6 months. She was informed of the importance of frequent follow-up visits to maximize her success with intensive lifestyle modifications for her multiple health conditions.   Objective:   Blood pressure (!) 154/76, pulse 72, temperature 98.3 F (36.8 C), height 5\' 7"  (1.702 m), weight 236 lb (107 kg), SpO2 99 %. Body mass index is 36.96 kg/m.  General: Cooperative, alert, well developed, in no acute distress. HEENT: Conjunctivae and lids unremarkable. Cardiovascular: Regular rhythm.  Lungs: Normal work of breathing. Neurologic: No focal deficits.   Lab Results  Component Value Date   CREATININE 1.05 (H) 11/21/2021   BUN 23 11/21/2021   NA 138 11/21/2021   K 4.1 11/21/2021   CL 98 11/21/2021   CO2 24 11/21/2021   Lab Results  Component Value Date   ALT 29 11/21/2021   AST 31 11/21/2021   ALKPHOS 54 11/21/2021   BILITOT 0.5 11/21/2021   Lab Results  Component Value Date   HGBA1C 5.5 11/21/2021   HGBA1C 5.6 04/25/2021   HGBA1C 5.7 (H) 08/22/2020   HGBA1C 5.6 09/30/2019   HGBA1C 5.6 03/19/2016   Lab Results  Component Value Date   INSULIN 18.4 11/21/2021   INSULIN 25.4 (H) 04/25/2021   INSULIN 16.8 08/22/2020   INSULIN 21.5 09/30/2019   Lab Results  Component Value Date   TSH 3.19 09/21/2019   Lab Results  Component Value Date   CHOL 153 11/21/2021   HDL 48 11/21/2021  LDLCALC 89 11/21/2021   TRIG 86 11/21/2021   CHOLHDL 3.3 08/22/2020   Lab Results  Component Value Date   VD25OH 40.3 11/21/2021   VD25OH 34.6 04/25/2021   VD25OH 30.3 08/22/2020   Lab Results  Component Value Date   WBC 4.5 05/19/2021   HGB 12.0 05/19/2021   HCT 37.5 05/19/2021   MCV 87.6 05/19/2021   PLT 213.0 05/19/2021   Lab Results  Component Value Date   IRON 61 03/20/2017   Attestation Statements:   Reviewed by clinician on day of visit: allergies,  medications, problem list, medical history, surgical history, family history, social history, and previous encounter notes.  I, Malcolm Metro, RMA, am acting as transcriptionist for Reuben Likes, MD.  I have reviewed the above documentation for accuracy and completeness, and I agree with the above. - Reuben Likes, MD

## 2022-10-18 ENCOUNTER — Telehealth: Payer: Self-pay | Admitting: Family

## 2022-10-18 ENCOUNTER — Other Ambulatory Visit: Payer: Self-pay | Admitting: Family

## 2022-10-18 NOTE — Telephone Encounter (Signed)
Prescription Request  10/18/2022  Is this a "Controlled Substance" medicine? No  LOV: Visit date not found (PT has appt 6/18)   What is the name of the medication or equipment?  meloxicam (MOBIC) 15 MG tablet [[id number]]   spironolactone (ALDACTONE) 25 MG tablet   simvastatin (ZOCOR) 20 MG tablet [[id number]]   Have you contacted your pharmacy to request a refill? Yes   Which pharmacy would you like this sent to?  [id number] [place], [place] - [address] [place] [address] Phone: [phone] Fax: [phone]      Patient notified that their request is being sent to the clinical staff for review and that they should receive a response within 2 business days.   Please advise at Mobile [phone] (mobile)

## 2022-10-23 ENCOUNTER — Ambulatory Visit (INDEPENDENT_AMBULATORY_CARE_PROVIDER_SITE_OTHER): Payer: BC Managed Care – PPO | Admitting: Family

## 2022-10-23 ENCOUNTER — Encounter: Payer: Self-pay | Admitting: Family

## 2022-10-23 VITALS — BP 150/80 | HR 62 | Temp 97.7°F | Ht 67.0 in | Wt 245.2 lb

## 2022-10-23 DIAGNOSIS — Z23 Encounter for immunization: Secondary | ICD-10-CM | POA: Diagnosis not present

## 2022-10-23 DIAGNOSIS — I10 Essential (primary) hypertension: Secondary | ICD-10-CM | POA: Diagnosis not present

## 2022-10-23 DIAGNOSIS — Z13220 Encounter for screening for lipoid disorders: Secondary | ICD-10-CM

## 2022-10-23 DIAGNOSIS — R7309 Other abnormal glucose: Secondary | ICD-10-CM

## 2022-10-23 DIAGNOSIS — Z0000 Encounter for general adult medical examination without abnormal findings: Secondary | ICD-10-CM

## 2022-10-23 DIAGNOSIS — R32 Unspecified urinary incontinence: Secondary | ICD-10-CM

## 2022-10-23 MED ORDER — METOPROLOL SUCCINATE ER 25 MG PO TB24: 25.0000 mg | ORAL_TABLET | Freq: Every day | ORAL | 1 refills | Status: DC

## 2022-10-23 NOTE — Progress Notes (Signed)
Tonya Taylor is a 67 y.o. female with the following history as recorded in EpicCare:  Patient Active Problem List   Diagnosis Date Noted   Class 2 severe obesity with serious comorbidity and body mass index (BMI) of 37.0 to 37.9 in adult [facility]) 05/21/2022   Prediabetes 05/21/2022   Decreased GFR 05/21/2022   Back pain 03/20/2017   URI (upper respiratory infection) 07/24/2016   Vitamin D deficiency 03/16/2015   Wellness examination 03/01/2014   Hyperglycemia 02/20/2013   Menopausal state 03/05/2012   Abnormal ECG 02/19/2011   NASH (nonalcoholic steatohepatitis) 02/07/2010   NEUTROPENIA UNSPECIFIED 01/31/2009   Iron deficiency anemia due to chronic blood loss 04/05/2008   FOOT PAIN, RIGHT 04/05/2008   ANKLE PAIN, LEFT 01/02/2008   EDEMA 01/02/2008   Dyslipidemia 02/01/2007   Essential hypertension 02/01/2007   INSOMNIA 02/01/2007   TOBACCO USE, QUIT 02/01/2007    Current Outpatient Medications  Medication Sig Dispense Refill   Ascorbic Acid (VITAMIN C) 1000 MG tablet Take 1,000 mg by mouth daily.     Calcium Carbonate-Vit D-Min (CALCIUM 600+D PLUS MINERALS PO) Take 2 tablets by mouth daily.     cetirizine (ZYRTEC) 10 MG tablet Take 10 mg by mouth daily.     Cholecalciferol (VITAMIN D3) 50 MCG (2000 UT) TABS Take 100 mcg by mouth daily. 30 tablet    Coenzyme Q10 (COQ10 PO) Take 1 capsule by mouth daily.     [facility] Fiber Gummies 2 g CHEW Chew by mouth.     Cyanocobalamin (VITAMIN B-12) 5000 MCG TBDP Take 5,000 mcg by mouth daily.     docusate sodium (COLACE) 100 MG capsule Take 100 mg by mouth 2 (two) times daily.     ECHINACEA PO Take 1 capsule by mouth daily. 760mg      fluticasone (FLONASE) 50 MCG/ACT nasal spray Place 2 sprays into both nostrils daily. 16 g 6   meloxicam (MOBIC) 15 MG tablet TAKE 1 TABLET BY MOUTH EVERY DAY AS DIRECTED 90 tablet 0   metoprolol succinate (TOPROL-XL) 25 MG 24 hr tablet Take 1 tablet (25 mg total) by mouth at bedtime. 90 tablet 1   Multiple  Vitamin (MULTIVITAMIN) tablet Take 1 tablet by mouth daily.     Omega-3 1400 MG CAPS Take 1 capsule by mouth daily.     OVER THE COUNTER MEDICATION Hair, skin, nails gummies- 1 po daily     simvastatin (ZOCOR) 20 MG tablet Take 1 tablet (20 mg total) by mouth at bedtime. 90 tablet 1   spironolactone (ALDACTONE) 25 MG tablet Take 1 tablet (25 mg total) by mouth daily. 90 tablet 1   telmisartan-hydrochlorothiazide (MICARDIS HCT) 80-25 MG tablet TAKE 1 TABLET BY MOUTH EVERY DAY 90 tablet 3   Vitamin E 400 units TABS Take 400 Units by mouth daily.     No current facility-administered medications for this visit.    Allergies: Amlodipine  Past Medical History:  Diagnosis Date   Allergy    ANEMIA DUE TO CHRONIC BLOOD LOSS 04/05/2008   Edema 01/02/2008   Glaucoma    HYPERLIPIDEMIA 02/01/2007   HYPERTENSION 02/01/2007   INSOMNIA 02/01/2007   NEUTROPENIA UNSPECIFIED 01/31/2009   Obesity    Osteopenia    Other chronic nonalcoholic liver disease 02/07/2010   TOBACCO USE, QUIT 02/01/2007    Past Surgical History:  Procedure Laterality Date   COLONOSCOPY     DENTAL SURGERY     HAND SURGERY     removed a nerve    KNEE ARTHROSCOPY  Left    ORIF SCAPHOID FRACTURE Right 07/08/2017   Procedure: OPEN REDUCTION INTERNAL FIXATION OF RIGHT SCAPHOID FRACTURE;  Surgeon: Mack Hook, MD;  Location: [facility];  Service: Orthopedics;  Laterality: Right;    Family History  Problem Relation Age of Onset   Cancer Mother        Lung Cancer   Lung cancer Mother    Prostate cancer Father    Cancer Father    Thyroid disease Father    Colon cancer Neg Hx    Esophageal cancer Neg Hx    Liver cancer Neg Hx    Pancreatic cancer Neg Hx    Rectal cancer Neg Hx    Stomach cancer Neg Hx     Social History   Tobacco Use   Smoking status: Former    Types: Cigarettes   Smokeless tobacco: Never  Substance Use Topics   Alcohol use: No    Alcohol/week: 0.0 standard drinks of alcohol     Subjective:   Presents for yearly CPE; is continuing to work with [employer]- 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  Head: Normocephalic and atraumatic  Eyes: Sclera and conjunctiva clear; pupils round and reactive to light; extraocular movements intact  Ears: External normal; canals clear; tympanic membranes normal  Oropharynx: Pink, supple. No suspicious lesions  Neck: Supple without thyromegaly, adenopathy  Lungs: Respirations unlabored; clear to auscultation bilaterally without wheeze, rales, rhonchi  CVS exam: normal rate and regular rhythm.  Abdomen: Soft; nontender; nondistended; normoactive bowel sounds; no masses or hepatosplenomegaly  Musculoskeletal: No deformities; no active joint inflammation  Extremities: No edema, cyanosis, clubbing  Vessels: Symmetric bilaterally  Neurologic: Alert and oriented; speech intact; face symmetrical; moves all extremities well; CNII-XII intact without focal deficit   Assessment:  1. PE (physical exam), annual   2. Need for vaccination against Streptococcus pneumoniae using pneumococcal conjugate vaccine 13   3. Lipid screening   4. Elevated glucose   5. Primary hypertension   6. Urinary incontinence, unspecified type     Plan:  Age appropriate preventive healthcare needs addressed; encouraged regular eye doctor and dental exams; encouraged regular exercise and weight loss; will update labs and refills as needed today; follow-up to be determined; Add  Toprol XL 25 mg at bedtime to help with blood pressure; re-check in 1 month; EKG shows sinus rhythm; Refer for pelvic floor physical therapy;  Prevnar 20 updated;   Return in about 1 month (around 11/22/2022) for re-check blood pressure.  Orders Placed This Encounter  Procedures   Pneumococcal conjugate vaccine 20-valent (Prevnar 20)   CBC with Differential/Platelet   Comp Met (CMET)   Lipid panel   TSH   Hemoglobin A1c   Ambulatory referral to Physical Therapy    Referral Priority:   Routine    Referral Type:   Physical Medicine    Referral Reason:  Specialty Services Required    Requested Specialty:   Physical Therapy    Number of Visits Requested:   1   EKG 12-Lead    Requested Prescriptions   Signed Prescriptions Disp Refills   metoprolol succinate (TOPROL-XL) 25 MG 24 hr tablet 90 tablet 1    Sig: Take 1 tablet (25 mg total) by mouth at bedtime.

## 2022-10-23 NOTE — Patient Instructions (Signed)
Please take the Olmesartan HCT/ Spironolactone in the am; take the Toprol XL 25 mg in the evening;

## 2022-10-24 LAB — COMPREHENSIVE METABOLIC PANEL
ALT: 33 U/L (ref 0–35)
AST: 30 U/L (ref 0–37)
Albumin: 3.7 g/dL (ref 3.5–5.2)
Alkaline Phosphatase: 44 U/L (ref 39–117)
BUN: 20 mg/dL (ref 6–23)
CO2: 30 mEq/L (ref 19–32)
Calcium: 9 mg/dL (ref 8.4–10.5)
Chloride: 103 mEq/L (ref 96–112)
Creatinine, Ser: 1.03 mg/dL (ref 0.40–1.20)
GFR: 56.93 mL/min — ABNORMAL LOW (ref >60.00)
Glucose, Bld: 66 mg/dL — ABNORMAL LOW (ref 70–99)
Potassium: 4 mEq/L (ref 3.5–5.1)
Sodium: 140 mEq/L (ref 135–145)
Total Bilirubin: 0.4 mg/dL (ref 0.2–1.2)
Total Protein: 7 g/dL (ref 6.0–8.3)

## 2022-10-24 LAB — CBC WITH DIFFERENTIAL/PLATELET
Basophils Absolute: 0 10*3/uL (ref 0.0–0.1)
Basophils Relative: 1.1 % (ref 0.0–3.0)
Eosinophils Absolute: 0.3 10*3/uL (ref 0.0–0.7)
Eosinophils Relative: 9.4 % — ABNORMAL HIGH (ref 0.0–5.0)
HCT: 39.1 % (ref 36.0–46.0)
Hemoglobin: 12.5 g/dL (ref 12.0–15.0)
Lymphocytes Relative: 37.4 % (ref 12.0–46.0)
Lymphs Abs: 1.4 10*3/uL (ref 0.7–4.0)
MCHC: 31.9 g/dL (ref 30.0–36.0)
MCV: 89.5 fl (ref 78.0–100.0)
Monocytes Absolute: 0.6 10*3/uL (ref 0.1–1.0)
Monocytes Relative: 15.9 % — ABNORMAL HIGH (ref 3.0–12.0)
Neutro Abs: 1.3 10*3/uL — ABNORMAL LOW (ref 1.4–7.7)
Neutrophils Relative %: 36.2 % — ABNORMAL LOW (ref 43.0–77.0)
Platelets: 195 10*3/uL (ref 150.0–400.0)
RBC: 4.36 Mil/uL (ref 3.87–5.11)
RDW: 14.1 % (ref 11.5–15.5)
WBC: 3.6 10*3/uL — ABNORMAL LOW (ref 4.0–10.5)

## 2022-10-24 LAB — LIPID PANEL
Cholesterol: 137 mg/dL (ref 0–200)
HDL: 39.9 mg/dL (ref >39.00)
LDL Cholesterol: 75 mg/dL (ref 0–99)
NonHDL: 97.49
Total CHOL/HDL Ratio: 3
Triglycerides: 111 mg/dL (ref 0.0–149.0)
VLDL: 22.2 mg/dL (ref 0.0–40.0)

## 2022-10-24 LAB — HEMOGLOBIN A1C: Hgb A1c MFr Bld: 5.6 % (ref 4.6–6.5)

## 2022-10-24 LAB — TSH: TSH: 2.2 u[IU]/mL (ref 0.35–5.50)

## 2023-01-02 ENCOUNTER — Other Ambulatory Visit: Payer: Self-pay | Admitting: Family

## 2023-02-14 ENCOUNTER — Other Ambulatory Visit: Payer: Self-pay | Admitting: Family

## 2023-03-04 ENCOUNTER — Ambulatory Visit (INDEPENDENT_AMBULATORY_CARE_PROVIDER_SITE_OTHER): Payer: BC Managed Care – PPO | Admitting: Family Medicine

## 2023-03-04 ENCOUNTER — Encounter (INDEPENDENT_AMBULATORY_CARE_PROVIDER_SITE_OTHER): Payer: Self-pay | Admitting: Family Medicine

## 2023-03-04 VITALS — BP 138/78 | HR 69 | Temp 97.6°F | Ht 67.0 in | Wt 240.0 lb

## 2023-03-04 DIAGNOSIS — I10 Essential (primary) hypertension: Secondary | ICD-10-CM | POA: Diagnosis not present

## 2023-03-04 DIAGNOSIS — E559 Vitamin D deficiency, unspecified: Secondary | ICD-10-CM | POA: Diagnosis not present

## 2023-03-04 DIAGNOSIS — Z6837 Body mass index (BMI) 37.0-37.9, adult: Secondary | ICD-10-CM

## 2023-03-04 DIAGNOSIS — E669 Obesity, unspecified: Secondary | ICD-10-CM

## 2023-03-04 NOTE — Progress Notes (Unsigned)
Chief Complaint:   OBESITY Tonya Taylor is here to discuss her progress with her obesity treatment plan along with follow-up of her obesity related diagnoses. Tonya Taylor is on the Category 2 Plan + 100 calories and states she is following her eating plan approximately 90% of the time. Tonya Taylor states she is walking for 30 minutes 5-7 times per week.  Today's visit was #: 37 Starting weight: 237 lbs Starting date: 09/30/2019 Today's weight: 240 lbs Today's date: 03/04/2023 Total lbs lost to date: 0 Total lbs lost since last in-office visit: 0  Interim History: Patient feeling frustrated but realizes she has been over indulging in snacks.  She recognizes she gets too hungry and loses control of food choices.  She realizes she is doing this. She is ready to get back on track. Her cousin recently died of cancer at 95 years old.   Subjective:   1. Essential hypertension Patient's blood pressure is slightly elevated today.  She is on Micardis-HCT, Aldactone, and Toprol.  She denies chest pain, chest pressure, or headache.  2. Vitamin D deficiency Patient is on OTC vitamin D 4000 IU daily, and she notes fatigue.  Assessment/Plan:   1. Essential hypertension Patient will continue Micardis-HCT, Aldactone, and Toprol.  2. Vitamin D deficiency Patient will continue OTC vitamin D with no change in therapy.  We will repeat labs in December.  3. BMI 37.0-37.9, adult  4. Obesity with starting BMI of 37.1 Tonya Taylor is currently in the action stage of change. As such, her goal is to continue with weight loss efforts. She has agreed to the Category 2 Plan + 100 calories.   Exercise goals: No exercise has been prescribed at this time.  Behavioral modification strategies: increasing lean protein intake, meal planning and cooking strategies, keeping healthy foods in the home, and planning for success.  Tonya Taylor has agreed to follow-up with our clinic in 4 weeks. She was informed of the importance  of frequent follow-up visits to maximize her success with intensive lifestyle modifications for her multiple health conditions.   Objective:   Blood pressure 138/78, pulse 69, temperature 97.6 F (36.4 C), height 5\' 7"  (1.702 m), weight 240 lb (108.9 kg), SpO2 100%. Body mass index is 37.59 kg/m.  General: Cooperative, alert, well developed, in no acute distress. HEENT: Conjunctivae and lids unremarkable. Cardiovascular: Regular rhythm.  Lungs: Normal work of breathing. Neurologic: No focal deficits.   Lab Results  Component Value Date   CREATININE 1.03 10/23/2022   BUN 20 10/23/2022   NA 140 10/23/2022   K 4.0 10/23/2022   CL 103 10/23/2022   CO2 30 10/23/2022   Lab Results  Component Value Date   ALT 33 10/23/2022   AST 30 10/23/2022   ALKPHOS 44 10/23/2022   BILITOT 0.4 10/23/2022   Lab Results  Component Value Date   HGBA1C 5.6 10/23/2022   HGBA1C 5.5 11/21/2021   HGBA1C 5.6 04/25/2021   HGBA1C 5.7 (H) 08/22/2020   HGBA1C 5.6 09/30/2019   Lab Results  Component Value Date   INSULIN 18.4 11/21/2021   INSULIN 25.4 (H) 04/25/2021   INSULIN 16.8 08/22/2020   INSULIN 21.5 09/30/2019   Lab Results  Component Value Date   TSH 2.20 10/23/2022   Lab Results  Component Value Date   CHOL 137 10/23/2022   HDL 39.90 10/23/2022   LDLCALC 75 10/23/2022   TRIG 111.0 10/23/2022   CHOLHDL 3 10/23/2022   Lab Results  Component Value Date   VD25OH 40.3  11/21/2021   VD25OH 34.6 04/25/2021   VD25OH 30.3 08/22/2020   Lab Results  Component Value Date   WBC 3.6 (L) 10/23/2022   HGB 12.5 10/23/2022   HCT 39.1 10/23/2022   MCV 89.5 10/23/2022   PLT 195.0 10/23/2022   Lab Results  Component Value Date   IRON 61 03/20/2017   Attestation Statements:   Reviewed by clinician on day of visit: allergies, medications, problem list, medical history, surgical history, family history, social history, and previous encounter notes.   I, Burt Knack, am acting as  transcriptionist for Reuben Likes, MD.  I have reviewed the above documentation for accuracy and completeness, and I agree with the above. - Reuben Likes, MD

## 2023-04-08 ENCOUNTER — Encounter (INDEPENDENT_AMBULATORY_CARE_PROVIDER_SITE_OTHER): Payer: Self-pay | Admitting: Family Medicine

## 2023-04-08 ENCOUNTER — Ambulatory Visit (INDEPENDENT_AMBULATORY_CARE_PROVIDER_SITE_OTHER): Payer: BC Managed Care – PPO | Admitting: Family Medicine

## 2023-04-08 VITALS — BP 146/82 | HR 65 | Temp 97.9°F | Ht 67.0 in | Wt 241.0 lb

## 2023-04-08 DIAGNOSIS — E669 Obesity, unspecified: Secondary | ICD-10-CM | POA: Diagnosis not present

## 2023-04-08 DIAGNOSIS — I10 Essential (primary) hypertension: Secondary | ICD-10-CM

## 2023-04-08 DIAGNOSIS — Z6837 Body mass index (BMI) 37.0-37.9, adult: Secondary | ICD-10-CM

## 2023-04-08 DIAGNOSIS — R632 Polyphagia: Secondary | ICD-10-CM | POA: Insufficient documentation

## 2023-04-08 NOTE — Assessment & Plan Note (Signed)
Discussed at length incorporating makeshift qsymia (lomaira and topiramate).  Patient given controlled substance contract.  She will consider this medication over the next month.

## 2023-04-08 NOTE — Progress Notes (Signed)
   SUBJECTIVE:  Chief Complaint: Obesity  Interim History: Patient voices that she has been eating quite a bit of sweets recently.  She realizes she has had an overload of sweets.     Talisa is here to discuss her progress with her obesity treatment plan. She is on the Category 2 Plan + 100 calories and states she is following her eating plan approximately 90 % of the time. She states she is walking 30 minutes 5 times per week.   OBJECTIVE: Visit Diagnoses: Problem List Items Addressed This Visit       Cardiovascular and Mediastinum   Essential hypertension - Primary    Blood pressure elevated today.  She is on toprol, micardis and aldactone.  She denies any chest pain, chest pressure and headache.  Her BP has been elevated in the past sporadically.  PCP is managing htn.  Will discuss changing medications at next appointment after the holidays- this may involve just changing dose of one medication.        Other   Polyphagia    Discussed at length incorporating makeshift qsymia (lomaira and topiramate).  Patient given controlled substance contract.  She will consider this medication over the next month.      Other Visit Diagnoses     BMI 37.0-37.9, adult       Obesity with starting BMI of 37.1           Vitals Temp: 97.9 F (36.6 C) BP: (!) 146/82 Pulse Rate: 65 SpO2: 100 %   Anthropometric Measurements Height: 5\' 7"  (1.702 m) Weight: 241 lb (109.3 kg) BMI (Calculated): 37.74 Weight at Last Visit: 240 lb Weight Lost Since Last Visit: 0 Weight Gained Since Last Visit: 1 Starting Weight: 237 lb Total Weight Loss (lbs): 0 lb (0 kg)   Body Composition  Body Fat %: 45.8 % Fat Mass (lbs): 110.4 lbs Muscle Mass (lbs): 124.2 lbs Total Body Water (lbs): 85.2 lbs Visceral Fat Rating : 15   Other Clinical Data Today's Visit #: 38 Starting Date: 09/30/19     ASSESSMENT AND PLAN:  Diet: Felisia is currently in the action stage of change. As such, her goal  is to continue with weight loss efforts. She has agreed to Category 2 Plan.  Exercise: Azyria has been instructed that some exercise is better than none for weight loss and overall health benefits.   Behavior Modification:  We discussed the following Behavioral Modification Strategies today: increasing lean protein intake, increasing vegetables, meal planning and cooking strategies, and better snacking choices. We discussed various medication options to help Mylene with her weight loss efforts and we both agreed to discuss makeshift qsymia at next appointment.  No follow-ups on file.Marland Kitchen She was informed of the importance of frequent follow up visits to maximize her success with intensive lifestyle modifications for her multiple health conditions.  Attestation Statements:   Reviewed by clinician on day of visit: allergies, medications, problem list, medical history, surgical history, family history, social history, and previous encounter notes.      Reuben Likes, MD

## 2023-04-08 NOTE — Assessment & Plan Note (Signed)
Blood pressure elevated today.  She is on toprol, micardis and aldactone.  She denies any chest pain, chest pressure and headache.  Her BP has been elevated in the past sporadically.  PCP is managing htn.  Will discuss changing medications at next appointment after the holidays- this may involve just changing dose of one medication.

## 2023-05-27 ENCOUNTER — Ambulatory Visit (INDEPENDENT_AMBULATORY_CARE_PROVIDER_SITE_OTHER): Payer: 59 | Admitting: Family Medicine

## 2023-05-27 ENCOUNTER — Encounter: Payer: Self-pay | Admitting: Family

## 2023-05-27 ENCOUNTER — Encounter (INDEPENDENT_AMBULATORY_CARE_PROVIDER_SITE_OTHER): Payer: Self-pay | Admitting: Family Medicine

## 2023-05-27 VITALS — BP 176/84 | HR 54 | Temp 98.0°F | Ht 67.0 in | Wt 242.0 lb

## 2023-05-27 DIAGNOSIS — R632 Polyphagia: Secondary | ICD-10-CM | POA: Diagnosis not present

## 2023-05-27 DIAGNOSIS — E669 Obesity, unspecified: Secondary | ICD-10-CM | POA: Diagnosis not present

## 2023-05-27 DIAGNOSIS — I10 Essential (primary) hypertension: Secondary | ICD-10-CM

## 2023-05-27 DIAGNOSIS — Z6837 Body mass index (BMI) 37.0-37.9, adult: Secondary | ICD-10-CM | POA: Diagnosis not present

## 2023-05-27 MED ORDER — METOPROLOL SUCCINATE ER 25 MG PO TB24: 50.0000 mg | ORAL_TABLET | Freq: Every day | ORAL | Status: DC

## 2023-05-27 NOTE — Progress Notes (Signed)
SUBJECTIVE:  Chief Complaint: Obesity  Interim History: Patient returns to clinic since early December.  She did get on her elliptical a few times.  Then work restarted and her consistency on the elliptical decreased.   Nabeeha is here to discuss her progress with her obesity treatment plan. She is on the Category 2 Plan and states she is following her eating plan approximately 90-95 % of the time. She states she is exercising and walking 30 minutes 5 times per week.   OBJECTIVE: Visit Diagnoses: Problem List Items Addressed This Visit       Cardiovascular and Mediastinum   Essential hypertension - Primary   Patient blood pressure has been elevated at home and throughout her doctor visits.  She was started on metoprolol in June but her BP is still high.  Will increase metoprolol to 50mg  (2 pills daily).  Patient had expressed interest in starting combination medication therapy of phentermine and topiramate however given her elevation in blood pressures over the last few appointments we discussed the need to control her blood pressures prior to starting medications.  Patient will keep a blood pressure log in which she will monitor her blood pressure at least 3 times a week and bring that log to the next appointment in 3 weeks.  If her blood pressure happens to be controlled at home and is only intermittently elevated during her doctors appointments we can further explore medication options at that time.  She has enough medications of her metoprolol to be able to increase to twice her current dosage without needing a refill.      Relevant Medications   metoprolol succinate (TOPROL-XL) 25 MG 24 hr tablet     Other   Polyphagia   Is ready to start medication combination therapy at this time.  However given her elevation in her blood pressure over the last few appointments we discussed holding off on medication treatment until her blood pressures are better controlled.  She is also interested  in other medications such as Ozempic/Wegovy or Mounjaro/stepdown however her insurance is unlikely to cover this given her diagnosis of just prediabetes and no other medical issues such as obstructive sleep apnea.  We once again talked about medication treatments today and patient is wanting to start these medication combination therapies when her blood pressure is better controlled.  We will follow-up in 3 weeks to assess blood pressure control on new medication dosages and blood pressures at home and if blood pressures have improved we will start combination medical therapy at that time.       Vitals Temp: 98 F (36.7 C) BP: (!) 176/84 Pulse Rate: (!) 54 SpO2: 100 %   Anthropometric Measurements Height: 5\' 7"  (1.702 m) Weight: 242 lb (109.8 kg) BMI (Calculated): 37.89 Weight at Last Visit: 240 lb Weight Lost Since Last Visit: 0 Weight Gained Since Last Visit: 1 Starting Weight: 237 lb   Body Composition  Body Fat %: 47.8 % Fat Mass (lbs): 115.8 lbs Muscle Mass (lbs): 120 lbs Total Body Water (lbs): 82.2 lbs Visceral Fat Rating : 15   Other Clinical Data Fasting: no Labs: no Today's Visit #: 39 Starting Date: 09/30/19     ASSESSMENT AND PLAN:  Diet: Jonnelle is currently in the action stage of change. As such, her goal is to continue with weight loss efforts. She has agreed to Category 2 Plan.  And is aware of the need to consistently taken total amount of protein and states that her food  scale is out in her kitchen to continue to monitor protein intake particularly at dinner as she has after dinner desires and cravings.  Exercise: Skylynn has been instructed that some exercise is better than none for weight loss and overall health benefits.   Behavior Modification:  We discussed the following Behavioral Modification Strategies today: increasing lean protein intake, increasing vegetables, meal planning and cooking strategies, better snacking choices, avoiding  temptations, and planning for success. We discussed various medication options to help Markiyah with her weight loss efforts and we both agreed to start combination phentermine and topiramate when blood pressure better controlled.  Return in about 3 weeks (around 06/17/2023) for fasting labs.. She was informed of the importance of frequent follow up visits to maximize her success with intensive lifestyle modifications for her multiple health conditions.  Attestation Statements:   Reviewed by clinician on day of visit: allergies, medications, problem list, medical history, surgical history, family history, social history, and previous encounter notes.     Reuben Likes, MD

## 2023-05-27 NOTE — Assessment & Plan Note (Addendum)
Patient blood pressure has been elevated at home and throughout her doctor visits.  She was started on metoprolol in June but her BP is still high.  Will increase metoprolol to 50mg  (2 pills daily).  Patient had expressed interest in starting combination medication therapy of phentermine and topiramate however given her elevation in blood pressures over the last few appointments we discussed the need to control her blood pressures prior to starting medications.  Patient will keep a blood pressure log in which she will monitor her blood pressure at least 3 times a week and bring that log to the next appointment in 3 weeks.  If her blood pressure happens to be controlled at home and is only intermittently elevated during her doctors appointments we can further explore medication options at that time.  She has enough medications of her metoprolol to be able to increase to twice her current dosage without needing a refill.

## 2023-05-27 NOTE — Assessment & Plan Note (Signed)
Is ready to start medication combination therapy at this time.  However given her elevation in her blood pressure over the last few appointments we discussed holding off on medication treatment until her blood pressures are better controlled.  She is also interested in other medications such as Ozempic/Wegovy or Mounjaro/stepdown however her insurance is unlikely to cover this given her diagnosis of just prediabetes and no other medical issues such as obstructive sleep apnea.  We once again talked about medication treatments today and patient is wanting to start these medication combination therapies when her blood pressure is better controlled.  We will follow-up in 3 weeks to assess blood pressure control on new medication dosages and blood pressures at home and if blood pressures have improved we will start combination medical therapy at that time.

## 2023-07-01 ENCOUNTER — Ambulatory Visit (INDEPENDENT_AMBULATORY_CARE_PROVIDER_SITE_OTHER): Payer: 59 | Admitting: Family Medicine

## 2023-07-01 ENCOUNTER — Encounter (INDEPENDENT_AMBULATORY_CARE_PROVIDER_SITE_OTHER): Payer: Self-pay | Admitting: Family Medicine

## 2023-07-01 VITALS — BP 190/92 | HR 54 | Temp 97.9°F | Ht 67.0 in | Wt 243.0 lb

## 2023-07-01 DIAGNOSIS — R7303 Prediabetes: Secondary | ICD-10-CM

## 2023-07-01 DIAGNOSIS — E559 Vitamin D deficiency, unspecified: Secondary | ICD-10-CM

## 2023-07-01 DIAGNOSIS — E785 Hyperlipidemia, unspecified: Secondary | ICD-10-CM | POA: Diagnosis not present

## 2023-07-01 DIAGNOSIS — Z6837 Body mass index (BMI) 37.0-37.9, adult: Secondary | ICD-10-CM

## 2023-07-01 DIAGNOSIS — R7989 Other specified abnormal findings of blood chemistry: Secondary | ICD-10-CM

## 2023-07-01 DIAGNOSIS — E66812 Obesity, class 2: Secondary | ICD-10-CM

## 2023-07-01 DIAGNOSIS — H00011 Hordeolum externum right upper eyelid: Secondary | ICD-10-CM

## 2023-07-01 DIAGNOSIS — E7849 Other hyperlipidemia: Secondary | ICD-10-CM

## 2023-07-01 DIAGNOSIS — I10 Essential (primary) hypertension: Secondary | ICD-10-CM

## 2023-07-01 MED ORDER — DILTIAZEM HCL ER COATED BEADS 120 MG PO CP24
120.0000 mg | ORAL_CAPSULE | Freq: Every day | ORAL | 0 refills | Status: DC
Start: 2023-07-01 — End: 2023-10-07

## 2023-07-01 NOTE — Assessment & Plan Note (Signed)
 BP elevated today.  She brought her blood pressure log today and her systolics have been 120-180s systolic and 70-100 diastolic.  Most of the blood pressures have been in the 160s/90s range.  She denies chest pain, chest pressure and headache.

## 2023-07-01 NOTE — Progress Notes (Signed)
 SUBJECTIVE:  Chief Complaint: Obesity  Interim History: She has been eating her meal plan 90% of the time.  Yogurt, orange juice and apple in the am.  Lunch is sandwich, bag of popcorn.  Dinner is a steak and possibly a baked potato.  She is occasionally using the choice brand meal pack.  Tonya Taylor is here to discuss her progress with her obesity treatment plan. She is on the Category 2 Plan and states she is following her eating plan approximately 90 % of the time. She states she is exercising 30 minutes 5 times per week.   OBJECTIVE: Visit Diagnoses: Problem List Items Addressed This Visit       Cardiovascular and Mediastinum   Essential hypertension - Primary   BP elevated today.  She brought her blood pressure log today and her systolics have been 120-180s systolic and 70-100 diastolic.  Most of the blood pressures have been in the 160s/90s range.  She denies chest pain, chest pressure and headache.      Relevant Medications   diltiazem (CARDIZEM CD) 120 MG 24 hr capsule     Other   Dyslipidemia   The 10-year ASCVD risk score (Arnett DK, et al., 2019) is: 18%   Values used to calculate the score:     Age: 68 years     Sex: Female     Is Non-Hispanic African American: Yes     Diabetic: No     Tobacco smoker: No     Systolic Blood Pressure: 190 mmHg     Is BP treated: Yes     HDL Cholesterol: 46 mg/dL     Total Cholesterol: 147 mg/dL  Last FLP with LDL controlled but patient is on Zocor.  Repeat FLP today.      Vitamin D deficiency   Last Vitamin D level below goal  Will repeat VIt D level today to see if that could be having an effect on mood and energy level.      Relevant Orders   VITAMIN D 25 Hydroxy (Vit-D Deficiency, Fractures) (Completed)   Class 2 severe obesity with serious comorbidity and body mass index (BMI) of 37.0 to 37.9 in adult [facility])   Other Clinical Data Today's Visit #: 40 Starting Date: 09/30/19 Anthropometric Measurements Height: 5\' 7"   (1.702 m) Weight: 243 lb (110.2 kg) BMI (Calculated): 38.05 Weight at Last Visit: 242 lb Weight Lost Since Last Visit: 0 Weight Gained Since Last Visit: 1 Starting Weight: 237 lb Total Weight Loss (lbs): 0 lb (0 kg)  Body Composition  Body Fat %: 47.4 % Fat Mass (lbs): 115.4 lbs Muscle Mass (lbs): 121.6 lbs Total Body Water (lbs): 81 lbs Visceral Fat Rating : 15  Patient wants to more fully commit to Category 2 plan.  Will need to follow up in the next 4 weeks  to ensure continued motivation and compliance.      Prediabetes   Last A1c in the normal range in June 2024.  Needs a repeat lab today as patient is fasting.  Insulin and CMP ordered as well.  Will discuss results with patient at next appointment.      Relevant Orders   Comprehensive metabolic panel (Completed)   Hemoglobin A1c (Completed)   Insulin, random (Completed)   Abnormal CBC   Prior lab showing low WBC.  Repeat CBC today and discuss results at next appointment.      Relevant Orders   CBC w/Diff/Platelet (Completed)   Other Visit Diagnoses  Other hyperlipidemia       Relevant Medications   diltiazem (CARDIZEM CD) 120 MG 24 hr capsule   Other Relevant Orders   Lipid Panel With LDL/HDL Ratio (Completed)     Hordeolum externum of right upper eyelid           No data recorded      07/01/2023    8:11 AM 07/01/2023    8:00 AM 05/27/2023    7:58 AM  Vitals with BMI  Height  5\' 7"    Weight  243 lbs   BMI  38.05   Systolic 190 193 409  Diastolic 92 91 84  Pulse  54        ASSESSMENT AND PLAN:  Diet: Tonya Taylor is currently in the action stage of change. As such, her goal is to continue with weight loss efforts. She has agreed to Category 2 Plan.  Exercise: Tonya Taylor has been instructed to work up to a goal of 150 minutes of combined cardio and strengthening exercise per week for weight loss and overall health benefits.   Behavior Modification:  We discussed the following Behavioral  Modification Strategies today: increasing lean protein intake, decreasing simple carbohydrates, no skipping meals, meal planning and cooking strategies, keeping healthy foods in the home, and planning for success.   No follow-ups on file.Marland Kitchen She was informed of the importance of frequent follow up visits to maximize her success with intensive lifestyle modifications for her multiple health conditions.  Attestation Statements:   Reviewed by clinician on day of visit: allergies, medications, problem list, medical history, surgical history, family history, social history, and previous encounter notes.   Reuben Likes, MD

## 2023-07-02 ENCOUNTER — Encounter: Payer: Self-pay | Admitting: Family

## 2023-07-02 LAB — CBC WITH DIFFERENTIAL/PLATELET
Basophils Absolute: 0 10*3/uL (ref 0.0–0.2)
Basos: 1 %
EOS (ABSOLUTE): 0.3 10*3/uL (ref 0.0–0.4)
Eos: 6 %
Hematocrit: 41.6 % (ref 34.0–46.6)
Hemoglobin: 13.3 g/dL (ref 11.1–15.9)
Immature Grans (Abs): 0 10*3/uL (ref 0.0–0.1)
Immature Granulocytes: 0 %
Lymphocytes Absolute: 1.3 10*3/uL (ref 0.7–3.1)
Lymphs: 31 %
MCH: 28.5 pg (ref 26.6–33.0)
MCHC: 32 g/dL (ref 31.5–35.7)
MCV: 89 fL (ref 79–97)
Monocytes Absolute: 0.6 10*3/uL (ref 0.1–0.9)
Monocytes: 13 %
Neutrophils Absolute: 2.1 10*3/uL (ref 1.4–7.0)
Neutrophils: 49 %
Platelets: 187 10*3/uL (ref 150–450)
RBC: 4.67 x10E6/uL (ref 3.77–5.28)
RDW: 13.5 % (ref 11.7–15.4)
WBC: 4.2 10*3/uL (ref 3.4–10.8)

## 2023-07-02 LAB — LIPID PANEL WITH LDL/HDL RATIO
Cholesterol, Total: 147 mg/dL (ref 100–199)
HDL: 46 mg/dL (ref >39)
LDL Chol Calc (NIH): 83 mg/dL (ref 0–99)
LDL/HDL Ratio: 1.8 ratio (ref 0.0–3.2)
Triglycerides: 99 mg/dL (ref 0–149)
VLDL Cholesterol Cal: 18 mg/dL (ref 5–40)

## 2023-07-02 LAB — COMPREHENSIVE METABOLIC PANEL
ALT: 30 IU/L (ref 0–32)
AST: 33 IU/L (ref 0–40)
Albumin: 4.2 g/dL (ref 3.9–4.9)
Alkaline Phosphatase: 64 IU/L (ref 44–121)
BUN/Creatinine Ratio: 21 (ref 12–28)
BUN: 22 mg/dL (ref 8–27)
Bilirubin Total: 0.5 mg/dL (ref 0.0–1.2)
CO2: 25 mmol/L (ref 20–29)
Calcium: 9.3 mg/dL (ref 8.7–10.3)
Chloride: 98 mmol/L (ref 96–106)
Creatinine, Ser: 1.05 mg/dL — ABNORMAL HIGH (ref 0.57–1.00)
Globulin, Total: 3 g/dL (ref 1.5–4.5)
Glucose: 79 mg/dL (ref 70–99)
Potassium: 4.6 mmol/L (ref 3.5–5.2)
Sodium: 137 mmol/L (ref 134–144)
Total Protein: 7.2 g/dL (ref 6.0–8.5)
eGFR: 59 mL/min/{1.73_m2} — ABNORMAL LOW (ref >59)

## 2023-07-02 LAB — VITAMIN D 25 HYDROXY (VIT D DEFICIENCY, FRACTURES): Vit D, 25-Hydroxy: 27.5 ng/mL — ABNORMAL LOW (ref 30.0–100.0)

## 2023-07-02 LAB — HEMOGLOBIN A1C
Est. average glucose Bld gHb Est-mCnc: 123 mg/dL
Hgb A1c MFr Bld: 5.9 % — ABNORMAL HIGH (ref 4.8–5.6)

## 2023-07-02 LAB — INSULIN, RANDOM: INSULIN: 24.9 u[IU]/mL (ref 2.6–24.9)

## 2023-07-08 DIAGNOSIS — R7989 Other specified abnormal findings of blood chemistry: Secondary | ICD-10-CM | POA: Insufficient documentation

## 2023-07-08 NOTE — Assessment & Plan Note (Signed)
 The 10-year ASCVD risk score (Arnett DK, et al., 2019) is: 18%   Values used to calculate the score:     Age: 68 years     Sex: Female     Is Non-Hispanic African American: Yes     Diabetic: No     Tobacco smoker: No     Systolic Blood Pressure: 190 mmHg     Is BP treated: Yes     HDL Cholesterol: 46 mg/dL     Total Cholesterol: 147 mg/dL  Last FLP with LDL controlled but patient is on Zocor.  Repeat FLP today.

## 2023-07-08 NOTE — Assessment & Plan Note (Signed)
 Prior lab showing low WBC.  Repeat CBC today and discuss results at next appointment.

## 2023-07-08 NOTE — Assessment & Plan Note (Signed)
 Other Clinical Data Today's Visit #: 40 Starting Date: 09/30/19 Anthropometric Measurements Height: 5\' 7"  (1.702 m) Weight: 243 lb (110.2 kg) BMI (Calculated): 38.05 Weight at Last Visit: 242 lb Weight Lost Since Last Visit: 0 Weight Gained Since Last Visit: 1 Starting Weight: 237 lb Total Weight Loss (lbs): 0 lb (0 kg)  Body Composition  Body Fat %: 47.4 % Fat Mass (lbs): 115.4 lbs Muscle Mass (lbs): 121.6 lbs Total Body Water (lbs): 81 lbs Visceral Fat Rating : 15  Patient wants to more fully commit to Category 2 plan.  Will need to follow up in the next 4 weeks  to ensure continued motivation and compliance.

## 2023-07-08 NOTE — Assessment & Plan Note (Signed)
 Last A1c in the normal range in June 2024.  Needs a repeat lab today as patient is fasting.  Insulin and CMP ordered as well.  Will discuss results with patient at next appointment.

## 2023-07-08 NOTE — Assessment & Plan Note (Signed)
 Last Vitamin D level below goal  Will repeat VIt D level today to see if that could be having an effect on mood and energy level.

## 2023-07-12 ENCOUNTER — Ambulatory Visit: Payer: Self-pay | Admitting: Family

## 2023-07-12 ENCOUNTER — Encounter: Payer: Self-pay | Admitting: Family

## 2023-07-12 VITALS — BP 148/82 | HR 60 | Ht 67.0 in | Wt 250.0 lb

## 2023-07-12 DIAGNOSIS — I10 Essential (primary) hypertension: Secondary | ICD-10-CM

## 2023-07-12 NOTE — Progress Notes (Signed)
 Tonya Taylor is a 68 y.o. female with the following history as recorded in [employer]:  Patient Active Problem List   Diagnosis Date Noted   Abnormal CBC 07/08/2023   Polyphagia 04/08/2023   Class 2 severe obesity with serious comorbidity and body mass index (BMI) of 37.0 to 37.9 in adult (HCC) 05/21/2022   Prediabetes 05/21/2022   Decreased GFR 05/21/2022   Back pain 03/20/2017   URI (upper respiratory infection) 07/24/2016   Vitamin D deficiency 03/16/2015   Wellness examination 03/01/2014   Hyperglycemia 02/20/2013   Menopausal state 03/05/2012   Abnormal ECG 02/19/2011   NASH (nonalcoholic steatohepatitis) 02/07/2010   NEUTROPENIA UNSPECIFIED 01/31/2009   Iron deficiency anemia due to chronic blood loss 04/05/2008   FOOT PAIN, RIGHT 04/05/2008   ANKLE PAIN, LEFT 01/02/2008   EDEMA 01/02/2008   Dyslipidemia 02/01/2007   Essential hypertension 02/01/2007   INSOMNIA 02/01/2007   TOBACCO USE, QUIT 02/01/2007    Current Outpatient Medications  Medication Sig Dispense Refill   Ascorbic Acid (VITAMIN C) 1000 MG tablet Take 1,000 mg by mouth daily.     Calcium Carbonate-Vit D-Min (CALCIUM 600+D PLUS MINERALS PO) Take 2 tablets by mouth daily.     cetirizine (ZYRTEC) 10 MG tablet Take 10 mg by mouth daily.     Cholecalciferol (VITAMIN D3) 50 MCG (2000 UT) TABS Take 100 mcg by mouth daily. 30 tablet    Coenzyme Q10 (COQ10 PO) Take 1 capsule by mouth daily.     [employer] Fiber Gummies 2 g CHEW Chew by mouth.     Cyanocobalamin (VITAMIN B-12) 5000 MCG TBDP Take 5,000 mcg by mouth daily.     fluticasone (FLONASE) 50 MCG/ACT nasal spray Place 2 sprays into both nostrils daily. 16 g 6   meloxicam (MOBIC) 15 MG tablet TAKE 1 TABLET BY MOUTH EVERY DAY AS DIRECTED 90 tablet 0   metoprolol succinate (TOPROL-XL) 25 MG 24 hr tablet Take 2 tablets (50 mg total) by mouth daily.     Multiple Vitamin (MULTIVITAMIN) tablet Take 1 tablet by mouth daily.     Omega-3 1400 MG CAPS Take 1 capsule by  mouth daily.     OVER THE COUNTER MEDICATION Hair, skin, nails gummies- 1 po daily     simvastatin (ZOCOR) 20 MG tablet TAKE 1 TABLET BY MOUTH EVERYDAY AT BEDTIME 90 tablet 3   spironolactone (ALDACTONE) 25 MG tablet TAKE 1 TABLET (25 MG TOTAL) BY MOUTH DAILY. 90 tablet 3   telmisartan-hydrochlorothiazide (MICARDIS HCT) 80-25 MG tablet TAKE 1 TABLET BY MOUTH EVERY DAY 90 tablet 3   Vitamin E 400 units TABS Take 400 Units by mouth daily.     diltiazem (CARDIZEM CD) 120 MG 24 hr capsule Take 1 capsule (120 mg total) by mouth daily. (Patient not taking: Reported on 07/12/2023) 30 capsule 0   No current facility-administered medications for this visit.    Allergies: Amlodipine  Past Medical History:  Diagnosis Date   Allergy    ANEMIA DUE TO CHRONIC BLOOD LOSS 04/05/2008   Edema 01/02/2008   Glaucoma    HYPERLIPIDEMIA 02/01/2007   HYPERTENSION 02/01/2007   INSOMNIA 02/01/2007   NEUTROPENIA UNSPECIFIED 01/31/2009   Obesity    Osteopenia    Other chronic nonalcoholic liver disease 02/07/2010   TOBACCO USE, QUIT 02/01/2007    Past Surgical History:  Procedure Laterality Date   COLONOSCOPY     DENTAL SURGERY     HAND SURGERY     removed a nerve    KNEE  ARTHROSCOPY Left    ORIF SCAPHOID FRACTURE Right 07/08/2017   Procedure: OPEN REDUCTION INTERNAL FIXATION OF RIGHT SCAPHOID FRACTURE;  Surgeon: Mack Hook, MD;  Location: [facility];  Service: Orthopedics;  Laterality: Right;    Family History  Problem Relation Age of Onset   Cancer Mother        Lung Cancer   Lung cancer Mother    Prostate cancer Father    Cancer Father    Thyroid disease Father    Colon cancer Neg Hx    Esophageal cancer Neg Hx    Liver cancer Neg Hx    Pancreatic cancer Neg Hx    Rectal cancer Neg Hx    Stomach cancer Neg Hx     Social History   Tobacco Use   Smoking status: Former    Types: Cigarettes   Smokeless tobacco: Never  Substance Use Topics   Alcohol use: No    Alcohol/week: 0.0  standard drinks of alcohol    Subjective:   Patient presents with concerns about her blood pressure; accompanied by her husband; Denies any chest pain, shortness of breath, blurred vision or headache Readings at [facility] have been elevated- provider there wanted to add another agent but patient hesitant to start; She has not been checking her blood pressure regularly; weight is up about 8 pounds since December 2024;     Objective:  Vitals:   07/12/23 1439 07/12/23 1640  BP: (!) 140/82 (!) 148/82  Pulse: 60   SpO2: 97%   Weight: 250 lb (113.4 kg)   Height: 5\' 7"  (1.702 m)     General: Well developed, well nourished, in no acute distress  Skin : Warm and dry.  Head: Normocephalic and atraumatic  Eyes: Sclera and conjunctiva clear; pupils round and reactive to light; extraocular movements intact  Ears: External normal; canals clear; tympanic membranes normal  Oropharynx: Pink, supple. No suspicious lesions  Neck: Supple without thyromegaly, adenopathy  Lungs: Respirations unlabored; clear to auscultation bilaterally without wheeze, rales, rhonchi  CVS exam: normal rate and regular rhythm.  Neurologic: Alert and oriented; speech intact; face symmetrical; moves all extremities well; CNII-XII intact without focal deficit  Assessment:  1. Hypertension, unspecified type     Plan:  EKG shows sinus rhythm- no acute concerning changes; will refer to cardiology to rule out underlying causes for fluctuations; will continue same medication for now- she is not taking Cardizem at this time; she is encouraged to start checking her blood pressure regularly and take log/ cuff to appointment with cardiology; follow up after she sees cardiology.   No follow-ups on file.  Orders Placed This Encounter  Procedures   Ambulatory referral to Cardiology    Referral Priority:   Routine    Referral Type:   Consultation    Referral Reason:   Specialty Services Required    Number of Visits  Requested:   1   EKG 12-Lead    Requested Prescriptions    No prescriptions requested or ordered in this encounter

## 2023-08-08 ENCOUNTER — Ambulatory Visit (INDEPENDENT_AMBULATORY_CARE_PROVIDER_SITE_OTHER): Payer: 59 | Admitting: Family Medicine

## 2023-08-15 ENCOUNTER — Encounter (INDEPENDENT_AMBULATORY_CARE_PROVIDER_SITE_OTHER): Payer: Self-pay

## 2023-08-31 ENCOUNTER — Other Ambulatory Visit: Payer: Self-pay | Admitting: Family

## 2023-09-02 ENCOUNTER — Other Ambulatory Visit: Payer: Self-pay | Admitting: Family

## 2023-09-02 DIAGNOSIS — Z1231 Encounter for screening mammogram for malignant neoplasm of breast: Secondary | ICD-10-CM

## 2023-10-01 ENCOUNTER — Other Ambulatory Visit: Payer: Self-pay | Admitting: Family

## 2023-10-03 ENCOUNTER — Ambulatory Visit
Admission: RE | Admit: 2023-10-03 | Discharge: 2023-10-03 | Disposition: A | Discharge status: Home / Self Care | Source: Ambulatory Visit | Attending: Family | Admitting: Family

## 2023-10-03 DIAGNOSIS — Z1231 Encounter for screening mammogram for malignant neoplasm of breast: Secondary | ICD-10-CM

## 2023-10-07 ENCOUNTER — Ambulatory Visit: Attending: Cardiology | Admitting: Cardiology

## 2023-10-07 ENCOUNTER — Encounter: Payer: Self-pay | Admitting: Cardiology

## 2023-10-07 VITALS — BP 148/77 | HR 85 | Ht 67.0 in | Wt 254.4 lb

## 2023-10-07 DIAGNOSIS — R0683 Snoring: Secondary | ICD-10-CM | POA: Diagnosis not present

## 2023-10-07 DIAGNOSIS — I10 Essential (primary) hypertension: Secondary | ICD-10-CM | POA: Diagnosis not present

## 2023-10-07 MED ORDER — LABETALOL HCL 200 MG PO TABS: 200.0000 mg | ORAL_TABLET | Freq: Two times a day (BID) | ORAL | 3 refills | Status: AC

## 2023-10-07 NOTE — Progress Notes (Signed)
 Cardiology Office Note:  .   Date:  10/07/2023  ID:  Tonya Taylor, DOB 1955-07-12, MRN [id number] PCP: Adra Alanis, FNP  [facility] Providers Cardiologist:  Fransico Ivy, MD PCP: Adra Alanis, FNP  Chief Complaint  Patient presents with   Hypertension     Tonya Taylor is a 68 y.o. female with hypertension, hyperlipidemia, prediabetes, obesity  Discussed the use of AI scribe software for clinical note transcription with the patient, who gave verbal consent to proceed.  History of Present Illness Tonya Taylor is a 68 year old female with hypertension who presents with elevated blood pressure readings. Her husband reports that she snores and has episodes of apnea during sleep.  Her blood pressure remains elevated despite an increased dosage of metoprolol  to 50 mg daily. She also takes spironolactone  25 mg, telmisartan , and hydrochlorothiazide  80/25 mg. Her husband observes snoring and apnea episodes during sleep, but she has not had a sleep study.  She experiences occasional wheezing for about a month, with no history of asthma, and acknowledges seasonal allergies. There is no chest pain or shortness of breath. Occasional leg swelling is present.  She is a Runner, broadcasting/film/video and stays active at work but does not engage in additional exercise. She consumes one cup of coffee daily, denies smoking or alcohol use, and eats both fresh and canned foods, including baked beans and corn.   Vitals:   10/07/23 1544 10/07/23 1547  BP: (!) 165/84 (!) 148/77  Pulse: 85   SpO2: 97%       Review of Systems  Cardiovascular:  Positive for leg swelling. Negative for chest pain, dyspnea on exertion, palpitations and syncope.        Studies Reviewed: Aaron Aas        EKG 10/07/2023: Sinus bradycardia Minimal voltage criteria for LVH, may be normal variant ( R in aVL ) Nonspecific T wave abnormality When compared with ECG of 05-Jul-2017 07:20, No significant  change was found     Independently interpreted 06/2023: Chol 147, TG 99, HDL 46, LDL 83 HbA1C 5.9% Hb 13.3 Cr 1.05  10/2022: TSH 2.2    Physical Exam Vitals and nursing note reviewed.  Constitutional:      General: She is not in acute distress. Neck:     Vascular: No JVD.  Cardiovascular:     Rate and Rhythm: Normal rate and regular rhythm.     Heart sounds: Normal heart sounds. No murmur heard. Pulmonary:     Effort: Pulmonary effort is normal.     Breath sounds: Normal breath sounds. No wheezing or rales.  Musculoskeletal:     Right lower leg: Edema (1+) present.     Left lower leg: Edema (1+) present.      VISIT DIAGNOSES:   ICD-10-CM   1. Essential hypertension  I10 EKG 12-Lead    ECHOCARDIOGRAM COMPLETE    2. Snoring  R06.83 Ambulatory referral to Sleep Studies       Tonya Taylor is a 68 y.o. female with hypertension, hyperlipidemia, prediabetes, obesity Assessment & Plan Hypertension: Hypertension not controlled to target <130/80 mmHg. Current regimen includes spironolactone , telmisartan , hydrochlorothiazide , and metoprolol . Metoprolol  suboptimal for hypertension without CAD or heart failure. Variability in BP readings noted. Dietary salt and possible OSA contributing factors. Amlodipine  declined by patient due to cancer concerns (although may not be very well substantiated). Labetalol chosen as alternative. - Discontinue metoprolol . - Start labetalol 200 mg twice daily. - Order echocardiogram to assess heart function. -  Advise reduction of dietary salt intake, avoiding canned foods, chips, fries, pickles, and eating out. - Encourage walking to achieve 10,000 steps most days of the week. - Instruct to keep a log of blood pressure readings at home and bring to next visit.  Obstructive sleep apnea (suspected): Suspected OSA based on snoring and apneic episodes. OSA can contribute to hypertension and cardiovascular stress. CPAP could help manage BP. -  Refer for sleep study to confirm diagnosis of obstructive sleep apnea.  Edema: Mild edema possibly related to dietary salt intake and hypertension. Current medications include spironolactone  and hydrochlorothiazide . - Advise reduction of dietary salt intake to help manage edema.  Wheezing: Wheezing for about a month, but no exertional dyspnea. No known history of asthma. Possible association with allergic rhinitis. - Consider further evaluation if wheezing persists.  Follow-up: - Schedule follow-up appointment with APP in 4-6 weeks to review blood pressure control and echocardiogram results.     Meds ordered this encounter  Medications   labetalol (NORMODYNE) 200 MG tablet    Sig: Take 1 tablet (200 mg total) by mouth 2 (two) times daily.    Dispense:  180 tablet    Refill:  3     F/u in 4-6 weeks If echocardiogram unremarkable, and blood pressure well-controlled, we can see her as needed. Future, if hypertension becomes resistant after 3-4 optimally dosed medications, could consider workup for secondary hypertension, including renin/aldosterone, TSH, renal artery duplex ultrasound   Signed, Cody Das, MD

## 2023-10-07 NOTE — Patient Instructions (Addendum)
 Medication Instructions:  STOP Metoprolol    START Labetalol 200 mg twice daily   *If you need a refill on your cardiac medications before your next appointment, please call your pharmacy*  Testing/Procedures: Echo  Your physician has requested that you have an echocardiogram. Echocardiography is a painless test that uses sound waves to create images of your heart. It provides your doctor with information about the size and shape of your heart and how well your heart's chambers and valves are working. This procedure takes approximately one hour. There are no restrictions for this procedure. Please do NOT wear cologne, perfume, aftershave, or lotions (deodorant is allowed). Please arrive 15 minutes prior to your appointment time.  Please note: We ask at that you not bring children with you during ultrasound (echo/ vascular) testing. Due to room size and safety concerns, children are not allowed in the ultrasound rooms during exams. Our front office staff cannot provide observation of children in our lobby area while testing is being conducted. An adult accompanying a patient to their appointment will only be allowed in the ultrasound room at the discretion of the ultrasound technician under special circumstances. We apologize for any inconvenience.  REFERRAL FOR SLEEP STUDY   Follow-Up: At [facility], you and your health needs are our priority.  As part of our continuing mission to provide you with exceptional heart care, our providers are all part of one team.  This team includes your primary Cardiologist (physician) and Advanced Practice Providers or APPs (Physician Assistants and Nurse Practitioners) who all work together to provide you with the care you need, when you need it.  Your next appointment:   4-6 week(s)  Provider:   WITH APP

## 2023-10-29 ENCOUNTER — Encounter: Payer: Self-pay | Admitting: Family

## 2023-10-29 ENCOUNTER — Ambulatory Visit (INDEPENDENT_AMBULATORY_CARE_PROVIDER_SITE_OTHER): Admitting: Family

## 2023-10-29 VITALS — BP 124/82 | HR 55 | Ht 67.0 in | Wt 254.8 lb

## 2023-10-29 DIAGNOSIS — R7309 Other abnormal glucose: Secondary | ICD-10-CM | POA: Diagnosis not present

## 2023-10-29 DIAGNOSIS — E2839 Other primary ovarian failure: Secondary | ICD-10-CM

## 2023-10-29 DIAGNOSIS — Z0000 Encounter for general adult medical examination without abnormal findings: Secondary | ICD-10-CM

## 2023-10-29 DIAGNOSIS — Z13820 Encounter for screening for osteoporosis: Secondary | ICD-10-CM

## 2023-10-29 DIAGNOSIS — Z13220 Encounter for screening for lipoid disorders: Secondary | ICD-10-CM | POA: Diagnosis not present

## 2023-10-29 DIAGNOSIS — R635 Abnormal weight gain: Secondary | ICD-10-CM | POA: Diagnosis not present

## 2023-10-29 DIAGNOSIS — R3915 Urgency of urination: Secondary | ICD-10-CM

## 2023-10-29 LAB — CBC WITH DIFFERENTIAL/PLATELET
Basophils Absolute: 0 10*3/uL (ref 0.0–0.1)
Basophils Relative: 1.2 % (ref 0.0–3.0)
Eosinophils Absolute: 0.3 10*3/uL (ref 0.0–0.7)
Eosinophils Relative: 10.2 % — ABNORMAL HIGH (ref 0.0–5.0)
HCT: 38.1 % (ref 36.0–46.0)
Hemoglobin: 12.5 g/dL (ref 12.0–15.0)
Lymphocytes Relative: 31.6 % (ref 12.0–46.0)
Lymphs Abs: 1.1 10*3/uL (ref 0.7–4.0)
MCHC: 32.7 g/dL (ref 30.0–36.0)
MCV: 88.2 fl (ref 78.0–100.0)
Monocytes Absolute: 0.6 10*3/uL (ref 0.1–1.0)
Monocytes Relative: 16.8 % — ABNORMAL HIGH (ref 3.0–12.0)
Neutro Abs: 1.4 10*3/uL (ref 1.4–7.7)
Neutrophils Relative %: 40.2 % — ABNORMAL LOW (ref 43.0–77.0)
Platelets: 204 10*3/uL (ref 150.0–400.0)
RBC: 4.32 Mil/uL (ref 3.87–5.11)
RDW: 14 % (ref 11.5–15.5)
WBC: 3.4 10*3/uL — ABNORMAL LOW (ref 4.0–10.5)

## 2023-10-29 LAB — LIPID PANEL
Cholesterol: 154 mg/dL (ref 0–200)
HDL: 43.7 mg/dL (ref >39.00)
LDL Cholesterol: 89 mg/dL (ref 0–99)
NonHDL: 110.52
Total CHOL/HDL Ratio: 4
Triglycerides: 106 mg/dL (ref 0.0–149.0)
VLDL: 21.2 mg/dL (ref 0.0–40.0)

## 2023-10-29 LAB — COMPREHENSIVE METABOLIC PANEL WITH GFR
ALT: 32 U/L (ref 0–35)
AST: 30 U/L (ref 0–37)
Albumin: 4 g/dL (ref 3.5–5.2)
Alkaline Phosphatase: 52 U/L (ref 39–117)
BUN: 19 mg/dL (ref 6–23)
CO2: 32 meq/L (ref 19–32)
Calcium: 9.3 mg/dL (ref 8.4–10.5)
Chloride: 102 meq/L (ref 96–112)
Creatinine, Ser: 1.1 mg/dL (ref 0.40–1.20)
GFR: 52.24 mL/min — ABNORMAL LOW (ref >60.00)
Glucose, Bld: 82 mg/dL (ref 70–99)
Potassium: 3.9 meq/L (ref 3.5–5.1)
Sodium: 139 meq/L (ref 135–145)
Total Bilirubin: 0.6 mg/dL (ref 0.2–1.2)
Total Protein: 7.1 g/dL (ref 6.0–8.3)

## 2023-10-29 LAB — TSH: TSH: 3.5 u[IU]/mL (ref 0.35–5.50)

## 2023-10-29 LAB — HEMOGLOBIN A1C: Hgb A1c MFr Bld: 5.9 % (ref 4.6–6.5)

## 2023-10-29 NOTE — Progress Notes (Signed)
 Tonya Taylor is a 68 y.o. female with the following history as recorded in EpicCare:  Patient Active Problem List   Diagnosis Date Noted   Snoring 10/07/2023   Abnormal CBC 07/08/2023   Polyphagia 04/08/2023   Class 2 severe obesity with serious comorbidity and body mass index (BMI) of 37.0 to 37.9 in adult (HCC) 05/21/2022   Prediabetes 05/21/2022   Decreased GFR 05/21/2022   Back pain 03/20/2017   URI (upper respiratory infection) 07/24/2016   Vitamin D  deficiency 03/16/2015   Wellness examination 03/01/2014   Hyperglycemia 02/20/2013   Menopausal state 03/05/2012   Abnormal ECG 02/19/2011   NASH (nonalcoholic steatohepatitis) 02/07/2010   NEUTROPENIA UNSPECIFIED 01/31/2009   Iron deficiency anemia due to chronic blood loss 04/05/2008   FOOT PAIN, RIGHT 04/05/2008   ANKLE PAIN, LEFT 01/02/2008   EDEMA 01/02/2008   Dyslipidemia 02/01/2007   Essential hypertension 02/01/2007   INSOMNIA 02/01/2007   TOBACCO USE, QUIT 02/01/2007    Current Outpatient Medications  Medication Sig Dispense Refill   Ascorbic Acid (VITAMIN C) 1000 MG tablet Take 1,000 mg by mouth daily.     Calcium Carbonate-Vit D-Min (CALCIUM 600+D PLUS MINERALS PO) Take 2 tablets by mouth daily.     cetirizine (ZYRTEC) 10 MG tablet Take 10 mg by mouth daily.     Cholecalciferol (VITAMIN D3) 50 MCG (2000 UT) TABS Take 100 mcg by mouth daily. 30 tablet    Coenzyme Q10 (COQ10 PO) Take 1 capsule by mouth daily.     [employer] Fiber Gummies 2 g CHEW Chew by mouth.     Cyanocobalamin (VITAMIN B-12) 5000 MCG TBDP Take 5,000 mcg by mouth daily.     fluticasone  (FLONASE ) 50 MCG/ACT nasal spray Place 2 sprays into both nostrils daily. 16 g 6   labetalol  (NORMODYNE ) 200 MG tablet Take 1 tablet (200 mg total) by mouth 2 (two) times daily. 180 tablet 3   meloxicam  (MOBIC ) 15 MG tablet TAKE 1 TABLET BY MOUTH EVERY DAY AS DIRECTED 90 tablet 0   Multiple Vitamin (MULTIVITAMIN) tablet Take 1 tablet by mouth daily.     Omega-3 1400  MG CAPS Take 1 capsule by mouth daily.     OVER THE COUNTER MEDICATION Hair, skin, nails gummies- 1 po daily     simvastatin  (ZOCOR ) 20 MG tablet TAKE 1 TABLET BY MOUTH EVERYDAY AT BEDTIME 90 tablet 3   spironolactone  (ALDACTONE ) 25 MG tablet TAKE 1 TABLET (25 MG TOTAL) BY MOUTH DAILY. 90 tablet 3   telmisartan -hydrochlorothiazide  (MICARDIS  HCT) 80-25 MG tablet Take 1 tablet by mouth daily. 90 tablet 0   Vitamin E 400 units TABS Take 400 Units by mouth daily.     No current facility-administered medications for this visit.    Allergies: Amlodipine   Past Medical History:  Diagnosis Date   Abnormal CBC    Abnormal EKG    Allergy    ANEMIA DUE TO CHRONIC BLOOD LOSS 04/05/2008   Back pain    Class 2 severe obesity with serious comorbidity and body mass index (BMI) of 37.0 to 37.9 in adult [facility])    Decreased GFR    Dyslipidemia    Edema    Foot pain    Glaucoma    Hyperglycemia    HYPERLIPIDEMIA 02/01/2007   HYPERTENSION 02/01/2007   INSOMNIA 02/01/2007   Iron deficiency anemia    Menopausal state    NASH (nonalcoholic steatohepatitis)    Neutropenia (HCC)    NEUTROPENIA UNSPECIFIED 01/31/2009   Obesity  Osteopenia    Other chronic nonalcoholic liver disease 02/07/2010   Polyphagia    Prediabetes    TOBACCO USE, QUIT 02/01/2007   URI (upper respiratory infection)    Vitamin D  deficiency     Past Surgical History:  Procedure Laterality Date   COLONOSCOPY     DENTAL SURGERY     HAND SURGERY     removed a nerve    KNEE ARTHROSCOPY Left    ORIF SCAPHOID FRACTURE Right 07/08/2017   Procedure: OPEN REDUCTION INTERNAL FIXATION OF RIGHT SCAPHOID FRACTURE;  Surgeon: Sebastian Lenis, MD;  Location: [facility];  Service: Orthopedics;  Laterality: Right;    Family History  Problem Relation Age of Onset   Cancer Mother        Lung Cancer   Lung cancer Mother    Prostate cancer Father    Cancer Father    Thyroid  disease Father    Colon cancer Neg Hx     Esophageal cancer Neg Hx    Liver cancer Neg Hx    Pancreatic cancer Neg Hx    Rectal cancer Neg Hx    Stomach cancer Neg Hx     Social History   Tobacco Use   Smoking status: Former    Types: Cigarettes   Smokeless tobacco: Never  Substance Use Topics   Alcohol use: No    Alcohol/week: 0.0 standard drinks of alcohol    Subjective:   Presents for yearly CPE; accompanied by husband; no acute concerns today;  Will be having sleep study later in July;   Review of Systems  Constitutional: Negative.   HENT: Negative.    Eyes: Negative.   Respiratory: Negative.    Cardiovascular: Negative.   Gastrointestinal: Negative.   Genitourinary: Negative.   Musculoskeletal: Negative.   Skin: Negative.   Neurological: Negative.   Endo/Heme/Allergies: Negative.   Psychiatric/Behavioral: Negative.       Objective:  Vitals:   10/29/23 0858  BP: 124/82  Pulse: (!) 55  SpO2: 100%  Weight: 254 lb 12.8 oz (115.6 kg)  Height: 5' 7 (1.702 m)    General: Well developed, well nourished, in no acute distress  Skin : Warm and dry.  Head: Normocephalic and atraumatic  Eyes: Sclera and conjunctiva clear; pupils round and reactive to light; extraocular movements intact  Ears: External normal; canals clear; tympanic membranes normal  Oropharynx: Pink, supple. No suspicious lesions  Neck: Supple without thyromegaly, adenopathy  Lungs: Respirations unlabored; clear to auscultation bilaterally without wheeze, rales, rhonchi  CVS exam: normal rate and regular rhythm.  Abdomen: Soft; nontender; nondistended; normoactive bowel sounds; no masses or hepatosplenomegaly  Musculoskeletal: No deformities; no active joint inflammation  Extremities: No edema, cyanosis, clubbing  Vessels: Symmetric bilaterally  Neurologic: Alert and oriented; speech intact; face symmetrical; moves all extremities well; CNII-XII intact without focal deficit   Assessment:  1. PE (physical exam), annual   2. Ovarian  failure   3. Osteoporosis screening   4. Lipid screening   5. Elevated glucose   6. Weight gain   7. Urinary urgency     Plan:  Age appropriate preventive healthcare needs addressed; encouraged regular eye doctor and dental exams; encouraged regular exercise; will update labs and refills as needed today; follow-up in 1 year, sooner prn.  DEXA order updated; refer to pelvic floor physical therapy;    No follow-ups on file.  Orders Placed This Encounter  Procedures   DG Bone Density    Standing Status:  Future    Expiration Date:   10/28/2024    Reason for Exam (SYMPTOM  OR DIAGNOSIS REQUIRED):   ovarian failure/ osteoporosis screening    Preferred imaging location?:   [facility]   CBC with Differential/Platelet   Comp Met (CMET)   Lipid panel   Hemoglobin A1c   TSH   Ambulatory referral to Physical Therapy    Referral Priority:   Routine    Referral Type:   Physical Medicine    Referral Reason:   Specialty Services Required    Requested Specialty:   Physical Therapy    Number of Visits Requested:   1    Requested Prescriptions    No prescriptions requested or ordered in this encounter

## 2023-10-30 ENCOUNTER — Ambulatory Visit: Payer: Self-pay | Admitting: Family

## 2023-11-18 ENCOUNTER — Encounter: Payer: Self-pay | Admitting: Neurology

## 2023-11-18 ENCOUNTER — Ambulatory Visit: Admitting: Neurology

## 2023-11-18 VITALS — BP 110/63 | HR 60 | Ht 68.0 in | Wt 256.0 lb

## 2023-11-18 DIAGNOSIS — Z9189 Other specified personal risk factors, not elsewhere classified: Secondary | ICD-10-CM | POA: Diagnosis not present

## 2023-11-18 DIAGNOSIS — R0683 Snoring: Secondary | ICD-10-CM | POA: Diagnosis not present

## 2023-11-18 DIAGNOSIS — R351 Nocturia: Secondary | ICD-10-CM

## 2023-11-18 DIAGNOSIS — R635 Abnormal weight gain: Secondary | ICD-10-CM

## 2023-11-18 DIAGNOSIS — R0681 Apnea, not elsewhere classified: Secondary | ICD-10-CM | POA: Diagnosis not present

## 2023-11-18 DIAGNOSIS — E669 Obesity, unspecified: Secondary | ICD-10-CM | POA: Diagnosis not present

## 2023-11-18 NOTE — Patient Instructions (Signed)

## 2023-11-18 NOTE — Progress Notes (Signed)
 Subjective:    Patient ID: Tonya Taylor is a 68 y.o. female.  HPI    True Mar, MD, PhD [facility] [address] [address] [place], [place] [address]  Dear Dr. Elmira,  I saw your patient, Tonya Taylor, upon your kind request in my sleep clinic today for initial consultation of her sleep disorder, in particular, concern for underlying obstructive sleep apnea.  The patient is accompanied by her husband today.  As you know, Tonya Taylor is a 68 year old female with an underlying medical history of hypertension, anemia, back pain, hyperlipidemia, edema, NASH, osteopenia, prediabetes, vitamin D  deficiency, and obesity, who reports snoring and excessive daytime somnolence as well as witnessed apneas per husband's report.  Her Epworth sleepiness score is 5 out of 24, fatigue severity score is 28 out of 63.  I reviewed your office note from 10/07/2023.  She lives with her husband, they have no children, no pets in the household.  They do not keep the TV on in their bedroom but it is sometimes on at night and goes off on a timer.  She is working on weight loss and goes to the healthy weight and wellness clinic.  However, she reports slow weight gain over time including in the past couple of months.  She is not aware of any family history of sleep apnea.  She works full-time as a Education officer, museum.  Bedtime is between 11 and midnight and rise time around 5:45 AM.  She has nocturia about once or twice per average night, denies recurrent nocturnal or morning headaches.  She has woken up with a sense of gasping for air once as she recalls.  She limits her caffeine to 1 cup of coffee in the morning.  She is a non-smoker and does not drink any alcohol.  Her Past Medical History Is Significant For: Past Medical History:  Diagnosis Date   Abnormal CBC    Abnormal EKG    Allergy    ANEMIA DUE TO CHRONIC BLOOD LOSS 04/05/2008   Back pain    Class 2 severe  obesity with serious comorbidity and body mass index (BMI) of 37.0 to 37.9 in adult [facility])    Decreased GFR    Dyslipidemia    Edema    Foot pain    Glaucoma    Hyperglycemia    HYPERLIPIDEMIA 02/01/2007   HYPERTENSION 02/01/2007   INSOMNIA 02/01/2007   Iron deficiency anemia    Menopausal state    NASH (nonalcoholic steatohepatitis)    Neutropenia (HCC)    NEUTROPENIA UNSPECIFIED 01/31/2009   Obesity    Osteopenia    Other chronic nonalcoholic liver disease 02/07/2010   Polyphagia    Prediabetes    TOBACCO USE, QUIT 02/01/2007   URI (upper respiratory infection)    Vitamin D  deficiency     Her Past Surgical History Is Significant For: Past Surgical History:  Procedure Laterality Date   COLONOSCOPY     DENTAL SURGERY     HAND SURGERY     removed a nerve    KNEE ARTHROSCOPY Left    ORIF SCAPHOID FRACTURE Right 07/08/2017   Procedure: OPEN REDUCTION INTERNAL FIXATION OF RIGHT SCAPHOID FRACTURE;  Surgeon: Sebastian Lenis, MD;  Location: [facility];  Service: Orthopedics;  Laterality: Right;    Her Family History Is Significant For: Family History  Problem Relation Age of Onset   Cancer Mother        Lung Cancer  Lung cancer Mother    Prostate cancer Father    Cancer Father    Thyroid  disease Father    Colon cancer Neg Hx    Esophageal cancer Neg Hx    Liver cancer Neg Hx    Pancreatic cancer Neg Hx    Rectal cancer Neg Hx    Stomach cancer Neg Hx    Sleep apnea Neg Hx     Her Social History Is Significant For: Social History   Socioeconomic History   Marital status: Married    Spouse name: Not on file   Number of children: Not on file   Years of education: Not on file   Highest education level: Not on file  Occupational History   Occupation: Works for school system  Tobacco Use   Smoking status: Former    Types: Cigarettes   Smokeless tobacco: Never  Vaping Use   Vaping status: Never Used  Substance and Sexual Activity   Alcohol use:  No    Alcohol/week: 0.0 standard drinks of alcohol   Drug use: No   Sexual activity: Not on file  Other Topics Concern   Not on file  Social History Narrative   Not on file   Social Drivers of Health   Financial Resource Strain: Not on file  Food Insecurity: Not on file  Transportation Needs: Not on file  Physical Activity: Not on file  Stress: Not on file  Social Connections: Not on file    Her Allergies Are:  Allergies  Allergen Reactions   Amlodipine  Other (See Comments)    Patient prefers not to take medication  :   Her Current Medications Are:  Outpatient Encounter Medications as of 11/18/2023  Medication Sig   Ascorbic Acid (VITAMIN C) 1000 MG tablet Take 1,000 mg by mouth daily.   Calcium Carbonate-Vit D-Min (CALCIUM 600+D PLUS MINERALS PO) Take 2 tablets by mouth daily.   cetirizine (ZYRTEC) 10 MG tablet Take 10 mg by mouth daily.   Cholecalciferol (VITAMIN D3) 50 MCG (2000 UT) TABS Take 100 mcg by mouth daily.   Coenzyme Q10 (COQ10 PO) Take 1 capsule by mouth daily.   [employer]S Fiber Gummies 2 g CHEW Chew by mouth.   Cyanocobalamin (VITAMIN B-12) 5000 MCG TBDP Take 5,000 mcg by mouth daily.   fluticasone  (FLONASE ) 50 MCG/ACT nasal spray Place 2 sprays into both nostrils daily. (Patient taking differently: Place 2 sprays into both nostrils as needed for allergies.)   labetalol  (NORMODYNE ) 200 MG tablet Take 1 tablet (200 mg total) by mouth 2 (two) times daily.   meloxicam  (MOBIC ) 15 MG tablet TAKE 1 TABLET BY MOUTH EVERY DAY AS DIRECTED   Multiple Vitamin (MULTIVITAMIN) tablet Take 1 tablet by mouth daily.   Omega-3 1400 MG CAPS Take 1 capsule by mouth daily.   OVER THE COUNTER MEDICATION Hair, skin, nails gummies- 1 po daily   simvastatin  (ZOCOR ) 20 MG tablet TAKE 1 TABLET BY MOUTH EVERYDAY AT BEDTIME   spironolactone  (ALDACTONE ) 25 MG tablet TAKE 1 TABLET (25 MG TOTAL) BY MOUTH DAILY.   telmisartan -hydrochlorothiazide  (MICARDIS  HCT) 80-25 MG tablet Take 1 tablet by  mouth daily.   Vitamin E 400 units TABS Take 400 Units by mouth daily.   No facility-administered encounter medications on file as of 11/18/2023.  :   Review of Systems:  Out of a complete 14 point review of systems, all are reviewed and negative with the exception of these symptoms as listed below:  Review of Systems  Neurological:  Pt here for sleep consult Pt snores,hypertension,fatigue Pt denies sleep study,cpap machine,headaches    ESS:5  FSS:28     Objective:  Neurological Exam  Physical Exam Physical Examination:   Vitals:   11/18/23 1015  BP: 110/63  Pulse: 60    General Examination: The patient is a very pleasant 68 y.o. female in no acute distress. She appears well-developed and well-nourished and well groomed.   HEENT: Normocephalic, atraumatic, pupils are equal, round and reactive to light, extraocular tracking is good without limitation to gaze excursion or nystagmus noted. Hearing is grossly intact. Face is symmetric with normal facial animation. Speech is clear with no dysarthria noted. There is no hypophonia. There is no lip, neck/head, jaw or voice tremor. Neck is supple with full range of passive and active motion. There are no carotid bruits on auscultation. Oropharynx exam reveals: mild mouth dryness, adequate dental hygiene and moderate airway crowding, due to small airway entry, Mallampati class III, tonsillar size 2+ on the right and 1-2+ on the left, slightly wider tongue.  Tongue protrudes centrally and palate elevates symmetrically, neck circumference 16 5/8 inches, no significant overbite, braces on the bottom teeth.  Chest: Clear to auscultation without wheezing, rhonchi or crackles noted.  Heart: S1+S2+0, regular and normal without murmurs, rubs or gallops noted.   Abdomen: Soft, non-tender and non-distended.  Extremities: There is trace pitting edema in the distal lower extremities bilaterally.   Skin: Warm and dry without trophic changes  noted.   Musculoskeletal: exam reveals no obvious joint deformities.   Neurologically:  Mental status: The patient is awake, alert and oriented in all 4 spheres. Her immediate and remote memory, attention, language skills and fund of knowledge are appropriate. There is no evidence of aphasia, agnosia, apraxia or anomia. Speech is clear with normal prosody and enunciation. Thought process is linear. Mood is normal and affect is normal.  Cranial nerves II - XII are as described above under HEENT exam.  Motor exam: Normal bulk, strength and tone is noted. There is no obvious action or resting tremor.  Fine motor skills and coordination: grossly intact.  Cerebellar testing: No dysmetria or intention tremor. There is no truncal or gait ataxia.  Sensory exam: intact to light touch in the upper and lower extremities.  Gait, station and balance: She stands easily. No veering to one side is noted. No leaning to one side is noted. Posture is age-appropriate and stance is narrow based. Gait shows normal stride length and normal pace. No problems turning are noted.   Assessment and Plan:  In summary, Berneita C Castanon is a very pleasant 68 y.o.-year old female with an underlying medical history of hypertension, anemia, back pain, hyperlipidemia, edema, NASH, osteopenia, prediabetes, vitamin D  deficiency, and obesity, whose history and physical exam are concerning for sleep disordered breathing, particularly obstructive sleep apnea (OSA). A laboratory attended sleep study is typically considered gold standard for evaluation of sleep disordered breathing.   I had a long chat with the patient and her husband about my findings and the diagnosis of sleep apnea , particularly OSA, its prognosis and treatment options. We talked about medical/conservative treatments, surgical interventions and non-pharmacological approaches for symptom control. I explained, in particular, the risks and ramifications of untreated  moderate to severe OSA, especially with respect to developing cardiovascular disease down the road, including congestive heart failure (CHF), difficult to treat hypertension, cardiac arrhythmias (particularly A-fib), neurovascular complications including TIA, stroke and dementia. Even type 2 diabetes has, in part, been  linked to untreated OSA. Symptoms of untreated OSA may include (but may not be limited to) daytime sleepiness, nocturia (i.e. frequent nighttime urination), memory problems, mood irritability and suboptimally controlled or worsening mood disorder such as depression and/or anxiety, lack of energy, lack of motivation, physical discomfort, as well as recurrent headaches, especially morning or nocturnal headaches. We talked about the importance of maintaining a healthy lifestyle and striving for healthy weight. In addition, we talked about the importance of striving for and maintaining good sleep hygiene. I recommended a sleep study at this time. I outlined the differences between a laboratory attended sleep study which is considered more comprehensive and accurate over the option of a home sleep test (HST); the latter may lead to underestimation of sleep disordered breathing in some instances and does not help with diagnosing upper airway resistance syndrome and is not accurate enough to diagnose primary central sleep apnea typically. I outlined possible surgical and non-surgical treatment options of OSA, including the use of a positive airway pressure (PAP) device (i.e. CPAP, AutoPAP/APAP or BiPAP in certain circumstances), a custom-made dental device (aka oral appliance, which would require a referral to a specialist dentist or orthodontist typically, and is generally speaking not considered for patients with full dentures or edentulous state), upper airway surgical options, such as traditional UPPP (which is not considered a first-line treatment) or the Inspire device (hypoglossal nerve stimulator,  which would involve a referral for consultation with an ENT surgeon, after careful selection, following inclusion criteria - also not first-line treatment). I explained the PAP treatment option to the patient in detail, as this is generally considered first-line treatment.  The patient indicated that she would be willing to try PAP therapy, if the need arises. I explained the importance of being compliant with PAP treatment, not only for insurance purposes but primarily to improve patient's symptoms symptoms, and for the patient's long term health benefit, including to reduce Her cardiovascular risks longer-term.    We will pick up our discussion about the next steps and treatment options after testing.  We will keep her posted as to the test results by phone call and/or MyChart messaging where possible.  We will plan to follow-up in sleep clinic accordingly as well.  I answered all their questions today and the patient and her husband were in agreement.   I encouraged her to call with any interim questions, concerns, problems or updates or email us  through MyChart.  Generally speaking, sleep test authorizations may take up to 2 weeks, sometimes less, sometimes longer, the patient is encouraged to get in touch with us  if they do not hear back from the sleep lab staff directly within the next 2 weeks.  Thank you very much for allowing me to participate in the care of this nice patient. If I can be of any further assistance to you please do not hesitate to call me at [phone].  Sincerely,   True Mar, MD, PhD

## 2023-11-20 ENCOUNTER — Ambulatory Visit: Attending: Cardiology | Admitting: Cardiology

## 2023-11-20 VITALS — BP 136/74 | HR 64 | Ht 67.0 in | Wt 258.0 lb

## 2023-11-20 DIAGNOSIS — E785 Hyperlipidemia, unspecified: Secondary | ICD-10-CM

## 2023-11-20 DIAGNOSIS — R0683 Snoring: Secondary | ICD-10-CM

## 2023-11-20 DIAGNOSIS — I10 Essential (primary) hypertension: Secondary | ICD-10-CM

## 2023-11-20 NOTE — Patient Instructions (Signed)
 Medication Instructions:  Your physician recommends that you continue on your current medications as directed. Please refer to the Current Medication list given to you today.  *If you need a refill on your cardiac medications before your next appointment, please call your pharmacy*  Lab Work: None ordered  If you have labs (blood work) drawn today and your tests are completely normal, you will receive your results only by: MyChart Message (if you have MyChart) OR A paper copy in the mail If you have any lab test that is abnormal or we need to change your treatment, we will call you to review the results.  Testing/Procedures: None ordered   You have been referred to Pharm D for Hypertension  Follow-Up: At [facility], you and your health needs are our priority.  As part of our continuing mission to provide you with exceptional heart care, our providers are all part of one team.  This team includes your primary Cardiologist (physician) and Advanced Practice Providers or APPs (Physician Assistants and Nurse Practitioners) who all work together to provide you with the care you need, when you need it.  Your next appointment:   4 month(s)  Provider:   Newman JINNY Lawrence, MD or Artist Pouch, PA-C          We recommend signing up for the patient portal called MyChart.  Sign up information is provided on this After Visit Summary.  MyChart is used to connect with patients for Virtual Visits (Telemedicine).  Patients are able to view lab/test results, encounter notes, upcoming appointments, etc.  Non-urgent messages can be sent to your provider as well.   To learn more about what you can do with MyChart, go to [email or web address].   Other Instructions

## 2023-11-20 NOTE — Progress Notes (Signed)
 Cardiology Office Note:   Date:  11/20/2023  ID:  Tonya Taylor, DOB [birth year], MRN [id number] PCP: Jason Leita Repine, FNP  [facility] Providers Cardiologist:  Newman JINNY Lawrence, MD    History of Present Illness:   Discussed the use of AI scribe software for clinical note transcription with the patient, who gave verbal consent to proceed.  History of Present Illness Tonya Taylor is a 68 year old female with hypertension who presents for follow up to reassess blood pressure management. She is accompanied by her husband.   She has a history of hypertension and was previously on metoprolol , which was switched to labetalol  200 mg twice daily one month ago. Her blood pressure has been improving with the new medication, with recent readings of 136/74 mmHg, 124/82, 110/63. No fatigue, dizziness, or lightheadedness since the medication change. She has been tracking her blood pressure at home using a new monitor that syncs with her phone, noting pre-medication readings in the 150s to 160s and post-medication readings in the 130s to 140s, with occasional readings in the 120s.  She has a history of hyperlipidemia, prediabetes, and obesity. She is actively trying to lose weight and hopes to reduce her medication burden. She drinks at least 100 ounces of water daily, which she is now spreading throughout the day to reduce nighttime urination. She is concerned about the potential side effects of amlodipine , which she previously discontinued due to concerns about cancer risk that she read about online.  Her current medication regimen includes telmisartan , hydrochlorothiazide , labetalol , and spironolactone . She takes most of her medications at night, except for one dose of labetalol  in the morning.  She has been referred for a sleep study due to suspected sleep apnea, which may be contributing to her hypertension. An echocardiogram has been scheduled.  Studies Reviewed:    EKG:           Risk Assessment/Calculations:         STOP-Bang Score:  6       Physical Exam:   VS:  BP 136/74   Pulse 64   Ht 5' 7 (1.702 m)   Wt 258 lb (117 kg)   SpO2 97%   BMI 40.41 kg/m    Wt Readings from Last 3 Encounters:  11/20/23 258 lb (117 kg)  11/18/23 256 lb (116.1 kg)  10/29/23 254 lb 12.8 oz (115.6 kg)     Physical Exam Vitals reviewed.  Constitutional:      Appearance: Normal appearance.  HENT:     Head: Normocephalic.     Nose: Nose normal.  Eyes:     Pupils: Pupils are equal, round, and reactive to light.  Cardiovascular:     Rate and Rhythm: Normal rate and regular rhythm.     Pulses: Normal pulses.     Heart sounds: Normal heart sounds. No murmur heard.    No friction rub. No gallop.  Pulmonary:     Effort: Pulmonary effort is normal.     Breath sounds: Normal breath sounds.  Abdominal:     General: Abdomen is flat.  Musculoskeletal:     Right lower leg: No edema.     Left lower leg: No edema.  Skin:    General: Skin is warm and dry.     Capillary Refill: Capillary refill takes less than 2 seconds.  Neurological:     General: No focal deficit present.     Mental Status: She is alert and oriented to person, place,  and time.  Psychiatric:        Mood and Affect: Mood normal.        Behavior: Behavior normal.        Thought Content: Thought content normal.        Judgment: Judgment normal.      ASSESSMENT AND PLAN:    Assessment & Plan Hypertension Hypertension management is ongoing with labetalol , telmisartan , hydrochlorothiazide , and spironolactone . Blood pressure readings have improved but at times remain above target (<130/80 mmHg). Current regimen is near maximum dosages, and further reduction in blood pressure likely to require additional agent. Amlodipine  was considered but declined at current time by patient due to cancer risk concerns. Discussed the importance of blood pressure control to prevent long-term cardiac complications  such as congestive heart failure. Echocardiogram scheduled to assess cardiac function and potential structural changes due to hypertension. Discussed potential secondary causes of hypertension, including renal artery stenosis and hormonal imbalances. - Continue labetalol  200 mg twice daily. - Continue telmisartan  80mg , hydrochlorothiazide  25mg , and spironolactone  25mg . - Move both Telmisartan /hydrochlorothiazide  and Spironolactone  to AM to reduce nighttime urination. - Monitor blood pressure twice daily, one to two hours after morning medication and at dinner time. Report log in 1-2 weeks. If BP still above goal, would consider increasing Spironolactone  to 50mg . - Discussed both renal artery duplex ultrasound and labs to assess secondary causes of hypertension, including thyroid  function and hyperaldosteronism. Given improving BP, will defer. - Consider referral to clinical pharmacist for medication management and optimization.  Suspected Sleep Apnea Discussed sleep apnea as a secondary cause of hypertension. A sleep study has been ordered to confirm diagnosis and guide treatment. - Proceed with scheduled sleep study.  Hyperlipidemia Continue statin per PCP.           Signed, Artist Pouch, PA-C

## 2023-11-21 ENCOUNTER — Encounter: Payer: Self-pay | Admitting: Family

## 2023-11-21 ENCOUNTER — Ambulatory Visit (HOSPITAL_COMMUNITY)
Admission: RE | Admit: 2023-11-21 | Discharge: 2023-11-21 | Disposition: A | Discharge status: Home / Self Care | Source: Ambulatory Visit | Attending: Cardiology | Admitting: Cardiology

## 2023-11-21 DIAGNOSIS — I10 Essential (primary) hypertension: Secondary | ICD-10-CM | POA: Diagnosis present

## 2023-11-21 DIAGNOSIS — I5030 Unspecified diastolic (congestive) heart failure: Secondary | ICD-10-CM | POA: Diagnosis not present

## 2023-11-21 LAB — ECHOCARDIOGRAM COMPLETE
Area-P 1/2: 3.5 cm2
S' Lateral: 2.47 cm

## 2023-11-23 ENCOUNTER — Ambulatory Visit: Payer: Self-pay | Admitting: Cardiology

## 2023-11-27 ENCOUNTER — Telehealth: Payer: Self-pay | Admitting: Neurology

## 2023-11-27 NOTE — Telephone Encounter (Signed)
 [employer] state no auth req   Patient is scheduled at [facility] for 01/10/2024 at 8 pm.  Mailed packet and sent mychart

## 2023-12-03 ENCOUNTER — Telehealth: Payer: Self-pay

## 2023-12-03 NOTE — Telephone Encounter (Signed)
Plan of care signed and faxed back to [facility] at [phone]. Form sent for scanning.  

## 2023-12-23 ENCOUNTER — Ambulatory Visit (HOSPITAL_BASED_OUTPATIENT_CLINIC_OR_DEPARTMENT_OTHER)
Admission: RE | Admit: 2023-12-23 | Discharge: 2023-12-23 | Disposition: A | Discharge status: Home / Self Care | Source: Ambulatory Visit | Attending: Family | Admitting: Family

## 2023-12-23 DIAGNOSIS — Z13820 Encounter for screening for osteoporosis: Secondary | ICD-10-CM | POA: Insufficient documentation

## 2023-12-23 DIAGNOSIS — E2839 Other primary ovarian failure: Secondary | ICD-10-CM | POA: Diagnosis present

## 2024-01-03 ENCOUNTER — Encounter: Payer: Self-pay | Admitting: Family

## 2024-01-03 ENCOUNTER — Ambulatory Visit: Admitting: Family

## 2024-01-03 VITALS — BP 122/84 | HR 60 | Ht 67.0 in | Wt 252.6 lb

## 2024-01-03 DIAGNOSIS — B351 Tinea unguium: Secondary | ICD-10-CM | POA: Diagnosis not present

## 2024-01-03 MED ORDER — SPIRONOLACTONE 25 MG PO TABS: 25.0000 mg | ORAL_TABLET | Freq: Every day | ORAL | 3 refills | Status: AC

## 2024-01-03 MED ORDER — TERBINAFINE HCL 250 MG PO TABS: 250.0000 mg | ORAL_TABLET | Freq: Every day | ORAL | 2 refills | Status: DC

## 2024-01-03 MED ORDER — TELMISARTAN-HCTZ 80-25 MG PO TABS: 1.0000 | ORAL_TABLET | Freq: Every day | ORAL | 3 refills | Status: AC

## 2024-01-03 NOTE — Progress Notes (Signed)
 Tonya Taylor is a 68 y.o. female with the following history as recorded in EpicCare:  Patient Active Problem List   Diagnosis Date Noted   Snoring 10/07/2023   Abnormal CBC 07/08/2023   Polyphagia 04/08/2023   Class 2 severe obesity with serious comorbidity and body mass index (BMI) of 37.0 to 37.9 in adult (HCC) 05/21/2022   Prediabetes 05/21/2022   Decreased GFR 05/21/2022   Back pain 03/20/2017   URI (upper respiratory infection) 07/24/2016   Vitamin D  deficiency 03/16/2015   Wellness examination 03/01/2014   Hyperglycemia 02/20/2013   Menopausal state 03/05/2012   Abnormal ECG 02/19/2011   NASH (nonalcoholic steatohepatitis) 02/07/2010   NEUTROPENIA UNSPECIFIED 01/31/2009   Iron deficiency anemia due to chronic blood loss 04/05/2008   FOOT PAIN, RIGHT 04/05/2008   ANKLE PAIN, LEFT 01/02/2008   EDEMA 01/02/2008   Dyslipidemia 02/01/2007   Essential hypertension 02/01/2007   INSOMNIA 02/01/2007   TOBACCO USE, QUIT 02/01/2007    Current Outpatient Medications  Medication Sig Dispense Refill   Ascorbic Acid (VITAMIN C) 1000 MG tablet Take 1,000 mg by mouth daily.     Calcium Carbonate-Vit D-Min (CALCIUM 600+D PLUS MINERALS PO) Take 2 tablets by mouth daily.     cetirizine (ZYRTEC) 10 MG tablet Take 10 mg by mouth daily.     Cholecalciferol (VITAMIN D3) 50 MCG (2000 UT) TABS Take 100 mcg by mouth daily. 30 tablet    Coenzyme Q10 (COQ10 PO) Take 1 capsule by mouth daily.     [employer] Fiber Gummies 2 g CHEW Chew by mouth.     Cyanocobalamin (VITAMIN B-12) 5000 MCG TBDP Take 5,000 mcg by mouth daily.     fluticasone  (FLONASE ) 50 MCG/ACT nasal spray Place 2 sprays into both nostrils daily. (Patient taking differently: Place 2 sprays into both nostrils as needed for allergies.) 16 g 6   labetalol  (NORMODYNE ) 200 MG tablet Take 1 tablet (200 mg total) by mouth 2 (two) times daily. 180 tablet 3   meloxicam  (MOBIC ) 15 MG tablet TAKE 1 TABLET BY MOUTH EVERY DAY AS DIRECTED 90 tablet 0    Multiple Vitamin (MULTIVITAMIN) tablet Take 1 tablet by mouth daily.     Omega-3 1400 MG CAPS Take 1 capsule by mouth daily.     OVER THE COUNTER MEDICATION Hair, skin, nails gummies- 1 po daily     simvastatin  (ZOCOR ) 20 MG tablet TAKE 1 TABLET BY MOUTH EVERYDAY AT BEDTIME 90 tablet 3   terbinafine  (LAMISIL ) 250 MG tablet Take 1 tablet (250 mg total) by mouth daily. 30 tablet 2   Vitamin E 400 units TABS Take 400 Units by mouth daily.     spironolactone  (ALDACTONE ) 25 MG tablet Take 1 tablet (25 mg total) by mouth daily. 90 tablet 3   telmisartan -hydrochlorothiazide  (MICARDIS  HCT) 80-25 MG tablet Take 1 tablet by mouth daily. 90 tablet 3   No current facility-administered medications for this visit.    Allergies: Amlodipine   Past Medical History:  Diagnosis Date   Abnormal CBC    Abnormal EKG    Allergy    ANEMIA DUE TO CHRONIC BLOOD LOSS 04/05/2008   Back pain    Class 2 severe obesity with serious comorbidity and body mass index (BMI) of 37.0 to 37.9 in adult [facility])    Decreased GFR    Dyslipidemia    Edema    Foot pain    Glaucoma    Hyperglycemia    HYPERLIPIDEMIA 02/01/2007   HYPERTENSION 02/01/2007   INSOMNIA 02/01/2007  Iron deficiency anemia    Menopausal state    NASH (nonalcoholic steatohepatitis)    Neutropenia (HCC)    NEUTROPENIA UNSPECIFIED 01/31/2009   Obesity    Osteopenia    Other chronic nonalcoholic liver disease 02/07/2010   Polyphagia    Prediabetes    TOBACCO USE, QUIT 02/01/2007   URI (upper respiratory infection)    Vitamin D  deficiency     Past Surgical History:  Procedure Laterality Date   COLONOSCOPY     DENTAL SURGERY     HAND SURGERY     removed a nerve    KNEE ARTHROSCOPY Left    ORIF SCAPHOID FRACTURE Right 07/08/2017   Procedure: OPEN REDUCTION INTERNAL FIXATION OF RIGHT SCAPHOID FRACTURE;  Surgeon: Sebastian Lenis, MD;  Location: [facility];  Service: Orthopedics;  Laterality: Right;    Family History  Problem  Relation Age of Onset   Cancer Mother        Lung Cancer   Lung cancer Mother    Prostate cancer Father    Cancer Father    Thyroid  disease Father    Colon cancer Neg Hx    Esophageal cancer Neg Hx    Liver cancer Neg Hx    Pancreatic cancer Neg Hx    Rectal cancer Neg Hx    Stomach cancer Neg Hx    Sleep apnea Neg Hx     Social History   Tobacco Use   Smoking status: Former    Types: Cigarettes   Smokeless tobacco: Never  Substance Use Topics   Alcohol use: No    Alcohol/week: 0.0 standard drinks of alcohol    Subjective:   Concerned about bilateral toenail infection; believes symptoms started after pedicure; has been using topical hydrogen peroxide with some benefit;   Objective:  Vitals:   01/03/24 1510  BP: 122/84  Pulse: 60  SpO2: 95%  Weight: 252 lb 9.6 oz (114.6 kg)  Height: 5' 7 (1.702 m)    General: Well developed, well nourished, in no acute distress  Skin : Warm and dry. Thickened discolored nails noted bilaterally on 1st toes Head: Normocephalic and atraumatic  Eyes: Sclera and conjunctiva clear; pupils round and reactive to light; extraocular movements intact  Ears: External normal; canals clear; tympanic membranes normal  Oropharynx: Pink, supple. No suspicious lesions  Neck: Supple without thyromegaly, adenopathy  Lungs: Respirations unlabored;  Neurologic: Alert and oriented; speech intact; face symmetrical; moves all extremities well; CNII-XII intact without focal deficit   Assessment:  1. Toenail fungus     Plan:  Rx for Lamisil  250 mg 1 tablet po every day; patient is aware that she should get her liver functions checked in about 6 weeks; risks/ benefits of medication discussed;   Also discussed that patient and her husband will be transferring care to a location closer to their home; thanked patient for letting me be part of her healthcare team for the past years.   No follow-ups on file.  Orders Placed This Encounter  Procedures   Comp  Met (CMET)    Standing Status:   Future    Expiration Date:   01/02/2025    Requested Prescriptions   Signed Prescriptions Disp Refills   spironolactone  (ALDACTONE ) 25 MG tablet 90 tablet 3    Sig: Take 1 tablet (25 mg total) by mouth daily.   telmisartan -hydrochlorothiazide  (MICARDIS  HCT) 80-25 MG tablet 90 tablet 3    Sig: Take 1 tablet by mouth daily.   terbinafine  (LAMISIL ) 250  MG tablet 30 tablet 2    Sig: Take 1 tablet (250 mg total) by mouth daily.

## 2024-01-10 ENCOUNTER — Ambulatory Visit (INDEPENDENT_AMBULATORY_CARE_PROVIDER_SITE_OTHER): Admitting: Neurology

## 2024-01-10 DIAGNOSIS — Z9189 Other specified personal risk factors, not elsewhere classified: Secondary | ICD-10-CM

## 2024-01-10 DIAGNOSIS — G4720 Circadian rhythm sleep disorder, unspecified type: Secondary | ICD-10-CM

## 2024-01-10 DIAGNOSIS — R0683 Snoring: Secondary | ICD-10-CM

## 2024-01-10 DIAGNOSIS — E669 Obesity, unspecified: Secondary | ICD-10-CM

## 2024-01-10 DIAGNOSIS — G4733 Obstructive sleep apnea (adult) (pediatric): Secondary | ICD-10-CM

## 2024-01-10 DIAGNOSIS — R351 Nocturia: Secondary | ICD-10-CM

## 2024-01-10 DIAGNOSIS — R635 Abnormal weight gain: Secondary | ICD-10-CM

## 2024-01-10 DIAGNOSIS — R0681 Apnea, not elsewhere classified: Secondary | ICD-10-CM

## 2024-01-10 DIAGNOSIS — G4761 Periodic limb movement disorder: Secondary | ICD-10-CM

## 2024-01-14 NOTE — Procedures (Signed)
 Physician Interpretation: Please see link under Procedure Tab or under Encounters tab for physician report, technical report, as well as O2 titration and/or PAP titration tables (if applicable).   Referred by: Dr. Modena Callander   History and Indication for Testing (obtained from visit note dated 11/07/2023): 68 year old female with an underlying complex medical history of stroke in May 2025, hypertension, hyperlipidemia, coronary artery disease, prior smoking, COPD, history of kidney stone, carotid artery disease with status post bilateral carotid endarterectomies, hypothyroidism, and overweight state, who reports snoring and nocturia. His Epworth sleepiness score is 3 out of 24, fatigue severity score is 15 out of 63.     Review of the EEG showed no abnormal electrical discharges and symmetrical bihemispheric findings.     EKG: The EKG revealed normal sinus rhythm (NSR). ***   AUDIO/VIDEO REVIEW: The audio and video review did not show any abnormal or unusual behaviors, movements, phonations or vocalizations. The patient took *** restroom breaks. Snoring was noted, ***   POST-STUDY QUESTIONNAIRE: Post study, the patient indicated, that sleep was *** the same as usual.    IMPRESSION:    Obstructive Sleep Apnea (OSA), *** ***Central Sleep Apnea (CSA) ***Primary Snoring ***Primary Central Sleep Apnea ***Complex Sleep Apnea ***PLMD (periodic limb movement disorder [of sleep]) ***Dysfunctions associated with sleep stages or arousal from sleep ***Non-specific abnormal electrocardiogram (EKG) ***Poor sleep pattern ***Inconclusive Test   RECOMMENDATIONS:         I certify that I have reviewed the entire raw data recording prior to the issuance of this report in accordance with the Standards of Accreditation of the American Academy of Sleep Medicine (AASM).   True Mar, MD, PhD Medical Director, [facility]) Diplomat, ABPN (Neurology and  Sleep)

## 2024-01-22 ENCOUNTER — Ambulatory Visit: Payer: Self-pay | Admitting: Neurology

## 2024-01-22 DIAGNOSIS — G4733 Obstructive sleep apnea (adult) (pediatric): Secondary | ICD-10-CM

## 2024-01-23 NOTE — Telephone Encounter (Signed)
-----   Message from True Mar sent at 01/22/2024  7:28 PM EDT ----- Patient referred by Dr. Elmira, seen by me on 11/18/23, diagnostic PSG on 01/10/24.    Please call and notify the patient that the recent sleep study showed moderate obstructive sleep apnea. I recommend treatment for in the form of autoPAP. We may consider at a CPAP titration study at  a later date, if need be, which means, that we would ask her to come back in for a second sleep study with CPAP treatment. For now, I would like to start her on a so-called autoPAP machine at home,  through a DME company (of her choice, or as per insurance requirement). The DME representative will educate her on how to use the machine, how to put the mask on, etc. I have placed an order in the  chart. Please send referral, talk to patient, send report to referring MD. We will need a FU in sleep clinic for 10 weeks post-PAP set up, please arrange that with me or one of our NPs. Thanks,   True Mar, MD, PhD [facility])    ----- Message ----- From: Rebecka Fleeta Higashi In One Three One Sent: 01/22/2024   7:27 PM EDT To: True Mar, MD

## 2024-01-29 NOTE — Telephone Encounter (Addendum)
 Spoke with patient and discussed sleep study results as noted below. The patient agreed to proceed. Discussed DMEs, will use [employer]. Pt will notify us  if no call within 1 week. Patient's questions were answered. Discussed insurance compliance requirements (usage and follow-up). She was scheduled for f/u on 12/17 at 345 pm.   Order sent to [employer] via community message. Result sent to Dr Elmira, referring provider.

## 2024-01-30 NOTE — Telephone Encounter (Signed)
 New, Maryella Shivers, Otilio Jefferson, RN; Alain Honey; Jeris Penta, [place]; 1 other Received, thank you!

## 2024-03-23 ENCOUNTER — Ambulatory Visit: Attending: Cardiology | Admitting: Cardiology

## 2024-03-23 ENCOUNTER — Encounter: Payer: Self-pay | Admitting: Cardiology

## 2024-03-23 VITALS — BP 138/80 | HR 61 | Ht 68.0 in | Wt 247.0 lb

## 2024-03-23 DIAGNOSIS — I10 Essential (primary) hypertension: Secondary | ICD-10-CM

## 2024-03-23 NOTE — Progress Notes (Signed)
  Cardiology Office Note:  .   Date:  03/23/2024  ID:  Tonya Taylor, DOB 16-Jan-1956, MRN [id number] PCP: Jason Leita Repine, FNP (Inactive)  [facility] Providers Cardiologist:  Newman Lawrence, MD PCP: Jason Leita Repine, FNP (Inactive)  Chief Complaint  Patient presents with   Hypertension     Tonya Taylor is a 68 y.o. female with hypertension, hyperlipidemia, prediabetes, obesity, OSA   History of Present Illness Blood pressure elevated at first check, since improved.  She is under a lot of stress at work, denies reports of chest pain or shortness of breath etc.  She is compliant with her medical therapy.  She has regular follow-up with the PCP, next in 06/2024.   Vitals:   03/23/24 1418 03/23/24 1439  BP: (!) 154/90 138/80  Pulse: 61   SpO2: 95%        Review of Systems  Cardiovascular:  Negative for chest pain, dyspnea on exertion, leg swelling, palpitations and syncope.        Studies Reviewed: SABRA        EKG 10/07/2023: Sinus bradycardia Minimal voltage criteria for LVH, may be normal variant ( R in aVL ) Nonspecific T wave abnormality When compared with ECG of 05-Jul-2017 07:20, No significant change was found     Echocardiogram 11/2023:  1. Left ventricular ejection fraction, by estimation, is 60 to 65%. The  left ventricle has normal function. The left ventricle has no regional  wall motion abnormalities. Left ventricular diastolic parameters are  consistent with Grade I diastolic  dysfunction (impaired relaxation). The average left ventricular global  longitudinal strain is -26.0 %. The global longitudinal strain is normal.   2. Right ventricular systolic function is normal. The right ventricular  size is normal. Tricuspid regurgitation signal is inadequate for assessing  PA pressure.   3. The mitral valve is normal in structure. Trivial mitral valve  regurgitation. No evidence of mitral stenosis.   4. The aortic valve is  tricuspid. Aortic valve regurgitation is not  visualized. No aortic stenosis is present.   5. The inferior vena cava is normal in size with greater than 50%  respiratory variability, suggesting right atrial pressure of 3 mmHg.    Labs 10/2023: Chol 154, TG 106, HDL 43, LDL 89 HbA1C 5.9% Hb 12.5 Cr 1.10, eGFR 52   Labs 06/2023: Chol 147, TG 99, HDL 46, LDL 83 HbA1C 5.9% Hb 13.3 Cr 1.05  10/2022: TSH 2.2    Physical Exam Vitals and nursing note reviewed.  Constitutional:      General: She is not in acute distress. Neck:     Vascular: No JVD.  Cardiovascular:     Rate and Rhythm: Normal rate and regular rhythm.     Heart sounds: Normal heart sounds. No murmur heard. Pulmonary:     Effort: Pulmonary effort is normal.     Breath sounds: Normal breath sounds. No wheezing or rales.  Musculoskeletal:     Right lower leg: No edema.     Left lower leg: No edema.      VISIT DIAGNOSES:   ICD-10-CM   1. Primary hypertension  I10         Tonya Taylor is a 68 y.o. female with hypertension, hyperlipidemia, prediabetes, obesity, OSA Assessment & Plan Hypertension: Improving, no changes made today.  Obstructive sleep apnea: Continue CPAP for OSA.  Edema: Resolved. F/u as needed    Signed, Newman JINNY Lawrence, MD

## 2024-03-23 NOTE — Patient Instructions (Addendum)
  Follow-Up: At [facility], you and your health needs are our priority.  As part of our continuing mission to provide you with exceptional heart care, our providers are all part of one team.  This team includes your primary Cardiologist (physician) and Advanced Practice Providers or APPs (Physician Assistants and Nurse Practitioners) who all work together to provide you with the care you need, when you need it.  Your next appointment:   As needed   Provider:   One of our Advanced Practice Providers (APPs): Morse Clause, PA-C  Lamarr Satterfield, NP Miriam Shams, NP  Olivia Pavy, PA-C Josefa Beauvais, NP  Leontine Salen, PA-C Orren Fabry, PA-C  [place], PA-C Ernest Dick, NP  Damien Braver, NP Jon Hails, PA-C  Waddell Donath, PA-C    Dayna Dunn, PA-C  Scott Weaver, PA-C Lum Louis, NP Katlyn West, NP Callie Goodrich, PA-C  Xika Zhao, NP Sheng Haley, PA-C    Kathleen Johnson, PA-C

## 2024-04-21 NOTE — Progress Notes (Unsigned)
 Tonya Taylor

## 2024-04-22 ENCOUNTER — Ambulatory Visit: Admitting: Adult Health

## 2024-04-22 ENCOUNTER — Encounter: Payer: Self-pay | Admitting: Adult Health

## 2024-04-22 VITALS — BP 135/72 | HR 63 | Ht 67.0 in | Wt 247.0 lb

## 2024-04-22 DIAGNOSIS — G4733 Obstructive sleep apnea (adult) (pediatric): Secondary | ICD-10-CM | POA: Diagnosis not present

## 2024-04-22 NOTE — Patient Instructions (Addendum)
 Your Plan:  Continue nightly use of CPAP for adequate sleep apnea management  Continue to follow with your DME company [employer] for any supplies or CPAP related concerns    Follow up in 1 year or call earlier if needed      Thank you for coming to see us  at [facility]. I hope we have been able to provide you high quality care today.  You may receive a patient satisfaction survey over the next few weeks. We would appreciate your feedback and comments so that we may continue to improve ourselves and the health of our patients.

## 2024-04-27 ENCOUNTER — Other Ambulatory Visit: Payer: Self-pay

## 2024-04-27 MED ORDER — MELOXICAM 15 MG PO TABS: 15.0000 mg | ORAL_TABLET | Freq: Every day | ORAL | 0 refills | Status: AC

## 2024-04-27 NOTE — Telephone Encounter (Signed)
 Copied from CRM #[id number]. Topic: Clinical - Prescription Issue >> Apr 27, 2024  8:52 AM Robinson H wrote: Reason for CRM: [facility] on [facility] from [facility] for her meloxicam  (MOBIC ) 15 MG tablet, states she's out and pharmacy sent a request over a week ago, no pending refill request on file. Patient is in the process of transferring from [place] to [place] for [facility].  Sheretha [phone]

## 2024-06-11 ENCOUNTER — Encounter: Payer: Self-pay | Admitting: Nurse Practitioner

## 2024-06-11 ENCOUNTER — Ambulatory Visit: Payer: Self-pay | Admitting: Nurse Practitioner

## 2024-06-11 ENCOUNTER — Ambulatory Visit: Admitting: Nurse Practitioner

## 2024-06-11 VITALS — BP 160/90 | HR 57 | Temp 97.6°F | Ht 67.0 in | Wt 245.6 lb

## 2024-06-11 DIAGNOSIS — N1831 Chronic kidney disease, stage 3a: Secondary | ICD-10-CM

## 2024-06-11 DIAGNOSIS — E785 Hyperlipidemia, unspecified: Secondary | ICD-10-CM

## 2024-06-11 DIAGNOSIS — G4733 Obstructive sleep apnea (adult) (pediatric): Secondary | ICD-10-CM | POA: Insufficient documentation

## 2024-06-11 DIAGNOSIS — E559 Vitamin D deficiency, unspecified: Secondary | ICD-10-CM

## 2024-06-11 DIAGNOSIS — R7303 Prediabetes: Secondary | ICD-10-CM

## 2024-06-11 DIAGNOSIS — I10 Essential (primary) hypertension: Secondary | ICD-10-CM

## 2024-06-11 LAB — COMPREHENSIVE METABOLIC PANEL WITH GFR
ALT: 21 U/L (ref 3–35)
AST: 23 U/L (ref 5–37)
Albumin: 3.8 g/dL (ref 3.5–5.2)
Alkaline Phosphatase: 54 U/L (ref 39–117)
BUN: 18 mg/dL (ref 6–23)
CO2: 30 meq/L (ref 19–32)
Calcium: 9.2 mg/dL (ref 8.4–10.5)
Chloride: 104 meq/L (ref 96–112)
Creatinine, Ser: 0.99 mg/dL (ref 0.40–1.20)
GFR: 59.02 mL/min — ABNORMAL LOW
Glucose, Bld: 95 mg/dL (ref 70–99)
Potassium: 3.9 meq/L (ref 3.5–5.1)
Sodium: 139 meq/L (ref 135–145)
Total Bilirubin: 0.3 mg/dL (ref 0.2–1.2)
Total Protein: 7.6 g/dL (ref 6.0–8.3)

## 2024-06-11 LAB — LIPID PANEL
Cholesterol: 157 mg/dL (ref 28–200)
HDL: 42.2 mg/dL
LDL Cholesterol: 96 mg/dL (ref 10–99)
NonHDL: 114.48
Total CHOL/HDL Ratio: 4
Triglycerides: 90 mg/dL (ref 10.0–149.0)
VLDL: 18 mg/dL (ref 0.0–40.0)

## 2024-06-11 LAB — HEMOGLOBIN A1C: Hgb A1c MFr Bld: 5.8 % (ref 4.6–6.5)

## 2024-06-11 LAB — VITAMIN D 25 HYDROXY (VIT D DEFICIENCY, FRACTURES): VITD: 38.92 ng/mL (ref 30.00–100.00)

## 2024-06-11 NOTE — Assessment & Plan Note (Signed)
 Repeat hgbA1c

## 2024-06-11 NOTE — Assessment & Plan Note (Signed)
 Previous participant of [facility] weight management clinic x 62yrs-no weight loss noted, no med used in the past. Admits she was non compliant with diet and exercise. Admits to high carb meals. Eats a day and 2snacks per day. Walks 30-26mins 2x/week when the weather is warm. Limited use of home elliptical machine due to chronic knee pain (bilateral) No etoh use Quit tobacco use 42yrs, smoked for 110yrs, <1/2ppd. Fhx of obesity (grandmother) Current use of CPAP, sleep 6-8hrs of sleep Water: 2-3L per day Coffee: 1cup day No sugary drinks Hx of fatty liver disease, OBSTRUCTIVE SLEEP APNEA, OA of knees, HYPERTENSION, prediabetes and dyslipidemia BP Readings from Last 3 Encounters:  06/11/24 (!) 142/80  04/22/24 135/72  03/23/24 138/80   Wt Readings from Last 3 Encounters:  06/11/24 245 lb 9.6 oz (111.4 kg)  04/22/24 247 lb (112 kg)  03/23/24 247 lb (112 kg)    We discussed the importance of lifestyle modifications (diet and exercise) to facilitate weight loss. She wants to try lifestyle modification prior to discussing pharmacological intervention. Provided printed information about meal portions and mediterranean diet. Advised to f/up in 2month with daily food log.

## 2024-06-11 NOTE — Assessment & Plan Note (Signed)
 eGFr in last 45yrs: 59.58, 56.93, and 52.33ml/min. Current use of telmisartan  ans spironolactone . BP not at goal. No DIABETES, no tobacco use No previous renal US   Repeat BMP today Advised about importance of BP control and DASH diet

## 2024-06-11 NOTE — Assessment & Plan Note (Signed)
 Under the care of Dr. Buck Current use of CPAP, sleeps 6-8hrs No daytime somnolence No LE edema

## 2024-06-11 NOTE — Assessment & Plan Note (Signed)
 Current use of OVER THE COUNTER dose. Repeat vit. D

## 2024-06-11 NOTE — Progress Notes (Signed)
 "               Established Patient Visit  Patient: Tonya Taylor   DOB: [birth year]   69 y.o. Female  MRN: [id number] Visit Date: 06/11/2024  Subjective:    Chief Complaint  Patient presents with   Transitions Of Care    Discuss weight loss management    HPI Healthcare Team: Dr. Elmira, Dr. Buck, Dr. Vincenzo   Essential hypertension Compliant with current meds (labetalol  200mg  BID, spironolactone  25mg , and talmisartan/hydrochlorothiazide  80/25mg  and use of CPAP machine Admits to high salt diet No tobacco use, no alcohol abuse Under the care of cardiology: Dr. Alisia BP Readings from Last 3 Encounters:  06/11/24 (!) 160/90  04/22/24 135/72  03/23/24 138/80    We discussed ways to implement a low sodium diet and no salt seasoning suggestions. Advised to Check BP at home in AM, no less than 3x/week. Bring BP readings and machine to next appointment. Maintain a low salt/sodium diet, and daily exercise.  F/up in 37month  OSA (obstructive sleep apnea) Under the care of Dr. Buck Current use of CPAP, sleeps 6-8hrs No daytime somnolence No LE edema  CKD (chronic kidney disease) stage 3, GFR 30-59 ml/min (HCC) eGFr in last 10yrs: 59.58, 56.93, and 52.16ml/min. Current use of telmisartan  ans spironolactone . BP not at goal. No DIABETES, no tobacco use No previous renal US   Repeat BMP today Advised about importance of BP control and DASH diet  Vitamin D  deficiency Current use of OVER THE COUNTER dose Repeat vit.D  Prediabetes Repeat hgbA1c  Morbid obesity (HCC) Previous participant of [facility] weight management clinic x 5yrs-no weight loss noted, no med used in the past. Admits she was non compliant with diet and exercise. Admits to high carb meals. Eats a day and 2snacks per day. Walks 30-55mins 2x/week when the weather is warm. Limited use of home elliptical machine due to chronic knee pain (bilateral) No etoh use Quit tobacco use 17yrs, smoked for 31yrs,  <1/2ppd. Fhx of obesity (grandmother) Current use of CPAP, sleep 6-8hrs of sleep Water: 2-3L per day Coffee: 1cup day No sugary drinks Hx of fatty liver disease, OBSTRUCTIVE SLEEP APNEA, OA of knees, HYPERTENSION, prediabetes and dyslipidemia BP Readings from Last 3 Encounters:  06/11/24 (!) 142/80  04/22/24 135/72  03/23/24 138/80   Wt Readings from Last 3 Encounters:  06/11/24 245 lb 9.6 oz (111.4 kg)  04/22/24 247 lb (112 kg)  03/23/24 247 lb (112 kg)    We discussed the importance of lifestyle modifications (diet and exercise) to facilitate weight loss. She wants to try lifestyle modification prior to discussing pharmacological intervention. Provided printed information about meal portions and mediterranean diet. Advised to f/up in 37month with daily food log.  Dyslipidemia Repeat lipid panel Maintain simvastatin  dose  Reviewed medical, surgical, and social history today  Medications: Show/hide medication list[1] Reviewed past medical and social history.   ROS per HPI above      Objective:  BP (!) 160/90 (BP Location: Left Arm, Patient Position: Sitting, Cuff Size: Large)   Pulse (!) 57   Temp 97.6 F (36.4 C) (Oral)   Ht 5' 7 (1.702 m)   Wt 245 lb 9.6 oz (111.4 kg)   SpO2 97%   BMI 38.47 kg/m      Physical Exam Vitals and nursing note reviewed.  Constitutional:      Appearance: She is obese.  Cardiovascular:     Rate and Rhythm: Normal rate.  Pulses: Normal pulses.  Pulmonary:     Effort: Pulmonary effort is normal.  Musculoskeletal:     Right lower leg: No edema.     Left lower leg: No edema.  Neurological:     Mental Status: She is alert and oriented to person, place, and time.     No results found for any visits on 06/11/24.    Assessment & Plan:    Problem List Items Addressed This Visit     CKD (chronic kidney disease) stage 3, GFR 30-59 ml/min (HCC)   eGFr in last 55yrs: 59.58, 56.93, and 52.41ml/min. Current use of telmisartan  ans  spironolactone . BP not at goal. No DIABETES, no tobacco use No previous renal US   Repeat BMP today Advised about importance of BP control and DASH diet      Relevant Orders   Comprehensive metabolic panel with GFR   Dyslipidemia   Repeat lipid panel Maintain simvastatin  dose      Relevant Orders   Lipid panel   Essential hypertension - Primary   Compliant with current meds (labetalol  200mg  BID, spironolactone  25mg , and talmisartan/hydrochlorothiazide  80/25mg  and use of CPAP machine Admits to high salt diet No tobacco use, no alcohol abuse Under the care of cardiology: Dr. Alisia BP Readings from Last 3 Encounters:  06/11/24 (!) 160/90  04/22/24 135/72  03/23/24 138/80    We discussed ways to implement a low sodium diet and no salt seasoning suggestions. Advised to Check BP at home in AM, no less than 3x/week. Bring BP readings and machine to next appointment. Maintain a low salt/sodium diet, and daily exercise.  F/up in 50month      Relevant Orders   Comprehensive metabolic panel with GFR   Morbid obesity (HCC)   Previous participant of [facility] weight management clinic x 72yrs-no weight loss noted, no med used in the past. Admits she was non compliant with diet and exercise. Admits to high carb meals. Eats a day and 2snacks per day. Walks 30-49mins 2x/week when the weather is warm. Limited use of home elliptical machine due to chronic knee pain (bilateral) No etoh use Quit tobacco use 54yrs, smoked for 94yrs, <1/2ppd. Fhx of obesity (grandmother) Current use of CPAP, sleep 6-8hrs of sleep Water: 2-3L per day Coffee: 1cup day No sugary drinks Hx of fatty liver disease, OBSTRUCTIVE SLEEP APNEA, OA of knees, HYPERTENSION, prediabetes and dyslipidemia BP Readings from Last 3 Encounters:  06/11/24 (!) 142/80  04/22/24 135/72  03/23/24 138/80   Wt Readings from Last 3 Encounters:  06/11/24 245 lb 9.6 oz (111.4 kg)  04/22/24 247 lb (112 kg)  03/23/24 247 lb  (112 kg)    We discussed the importance of lifestyle modifications (diet and exercise) to facilitate weight loss. She wants to try lifestyle modification prior to discussing pharmacological intervention. Provided printed information about meal portions and mediterranean diet. Advised to f/up in 50month with daily food log.      Relevant Orders   Hemoglobin A1c   OSA (obstructive sleep apnea)   Under the care of Dr. Buck Current use of CPAP, sleeps 6-8hrs No daytime somnolence No LE edema      Prediabetes   Repeat hgbA1c      Relevant Orders   Hemoglobin A1c   Vitamin D  deficiency   Current use of OVER THE COUNTER dose Repeat vit.D      Relevant Orders   VITAMIN D  25 Hydroxy (Vit-D Deficiency, Fractures)   Return in about 4 weeks (around 07/09/2024) for  HTN, Weight management.     Roselie Mood, NP      [1]  Outpatient Medications Prior to Visit  Medication Sig   Ascorbic Acid (VITAMIN C) 1000 MG tablet Take 1,000 mg by mouth daily.   Calcium Carbonate-Vit D-Min (CALCIUM 600+D PLUS MINERALS PO) Take 2 tablets by mouth daily.   cetirizine (ZYRTEC) 10 MG tablet Take 10 mg by mouth daily.   Cholecalciferol (VITAMIN D3) 50 MCG (2000 UT) TABS Take 100 mcg by mouth daily.   Coenzyme Q10 (COQ10 PO) Take 1 capsule by mouth daily.   [employer] Fiber Gummies 2 g CHEW Chew by mouth.   Cyanocobalamin (VITAMIN B-12) 5000 MCG TBDP Take 5,000 mcg by mouth daily.   fluticasone  (FLONASE ) 50 MCG/ACT nasal spray Place 2 sprays into both nostrils daily. (Patient taking differently: Place 2 sprays into both nostrils as needed for allergies.)   labetalol  (NORMODYNE ) 200 MG tablet Take 1 tablet (200 mg total) by mouth 2 (two) times daily.   MAGNESIUM PO Take 1 capsule by mouth daily.   meloxicam  (MOBIC ) 15 MG tablet Take 1 tablet (15 mg total) by mouth daily.   Multiple Vitamin (MULTIVITAMIN) tablet Take 1 tablet by mouth daily.   Omega-3 1400 MG CAPS Take 1 capsule by mouth daily.   OVER THE  COUNTER MEDICATION Hair, skin, nails gummies- 1 po daily   simvastatin  (ZOCOR ) 20 MG tablet TAKE 1 TABLET BY MOUTH EVERYDAY AT BEDTIME   spironolactone  (ALDACTONE ) 25 MG tablet Take 1 tablet (25 mg total) by mouth daily.   telmisartan -hydrochlorothiazide  (MICARDIS  HCT) 80-25 MG tablet Take 1 tablet by mouth daily.   Vitamin E 400 units TABS Take 400 Units by mouth daily.   [DISCONTINUED] terbinafine  (LAMISIL ) 250 MG tablet Take 1 tablet (250 mg total) by mouth daily. (Patient not taking: Reported on 06/11/2024)   No facility-administered medications prior to visit.   "

## 2024-06-11 NOTE — Assessment & Plan Note (Addendum)
 Repeat lipid panel Maintain simvastatin  dose

## 2024-06-11 NOTE — Patient Instructions (Signed)
 Thank you for choosing [facility] Go to lab for blood draw  Check BP at home in AM, no less than 3x/week. Bring BP readings and machine to next appointment. Maintain a low salt/sodium diet, and daily exercise.   Mediterranean Diet A Mediterranean diet is based on the traditions of countries on the [employer]. It focuses on eating more: Fruits and vegetables. Whole grains, beans, nuts, and seeds. Heart-healthy fats. These are fats that are good for your heart. It involves eating less: Dairy. Meat and eggs. Processed foods with added sugar, salt, and fat. This type of diet can help prevent certain conditions. It can also improve outcomes if you have a long-term (chronic) disease, such as kidney or heart disease. What are tips for following this plan? Reading food labels Check packaged foods for: The serving size. For foods such as rice and pasta, the serving size is the amount of cooked product, not dry. The total fat. Avoid foods with saturated fat or trans fat. Added sugars, such as corn syrup. Shopping  Try to have a balanced diet. Buy a variety of foods, such as: Fresh fruits and vegetables. You may be able to get these from local farmers markets. You can also buy them frozen. Grains, beans, nuts, and seeds. Some of these can be bought in bulk. Fresh seafood. Poultry and eggs. Low-fat dairy products. Buy whole ingredients instead of foods that have already been packaged. If you can't get fresh seafood, buy precooked frozen shrimp or canned fish, such as tuna, salmon, or sardines. Stock your pantry so you always have certain foods on hand, such as olive oil, canned tuna, canned tomatoes, rice, pasta, and beans. Cooking Cook foods with extra-virgin olive oil instead of using butter or other vegetable oils. Have meat as a side dish. Have vegetables or grains as your main dish. This means having meat in small portions or adding small amounts of meat to foods like  pasta or stew. Use beans or vegetables instead of meat in common dishes like chili or lasagna. Try out different cooking methods. Try roasting, broiling, steaming, and sauting vegetables. Add frozen vegetables to soups, stews, pasta, or rice. Add nuts or seeds for added healthy fats and plant protein at each meal. You can add these to yogurt, salads, or vegetable dishes. Marinate fish or vegetables using olive oil, lemon juice, garlic, and fresh herbs. Meal planning Plan to eat a vegetarian meal one day each week. Try to work up to two vegetarian meals, if possible. Eat seafood two or more times a week. Have healthy snacks on hand. These may include: Vegetable sticks with hummus. Greek yogurt. Fruit and nut trail mix. Eat balanced meals. These should include: Fruit: 2-3 servings a day. Vegetables: 4-5 servings a day. Low-fat dairy: 2 servings a day. Fish, poultry, or lean meat: 1 serving a day. Beans and legumes: 2 or more servings a week. Nuts and seeds: 1-2 servings a day. Whole grains: 6-8 servings a day. Extra-virgin olive oil: 3-4 servings a day. Limit red meat and sweets to just a few servings a month. Lifestyle  Try to cook and eat meals with your family. Drink enough fluid to keep your pee (urine) pale yellow. Be active every day. This includes: Aerobic exercise, which is exercise that causes your heart to beat faster. Examples include running and swimming. Leisure activities like gardening, walking, or housework. Get 7-8 hours of sleep each night. Drink red wine if your provider says you can. A glass of  wine is 5 oz (150 mL). You may be allowed to have: Up to 1 glass a day if you're female and not pregnant. Up to 2 glasses a day if you're female. What foods should I eat? Fruits Apples. Apricots. Avocado. Berries. Bananas. Cherries. Dates. Figs. Grapes. Lemons. Melon. Oranges. Peaches. Plums. Pomegranate. Vegetables Artichokes. Beets. Broccoli. Cabbage. Carrots. Eggplant.  Green beans. Chard. Kale. Spinach. Onions. Leeks. Peas. Squash. Tomatoes. Peppers. Radishes. Grains Whole-grain pasta. Brown rice. Bulgur wheat. Polenta. Couscous. Whole-wheat bread. [place] Madeira. Meats and other proteins Beans. Almonds. Sunflower seeds. Pine nuts. Peanuts. Cod. Salmon. Scallops. Shrimp. Tuna. Tilapia. Clams. Oysters. Eggs. Chicken or turkey without skin. Dairy Low-fat milk. Cheese. Greek yogurt. Fats and oils Extra-virgin olive oil. Avocado oil. Grapeseed oil. Beverages Water. Red wine. Herbal tea. Sweets and desserts Greek yogurt with honey. Baked apples. Poached pears. Trail mix. Seasonings and condiments Basil. Cilantro. Coriander. Cumin. Mint. Parsley. Sage. Rosemary. Tarragon. Garlic. Oregano. Thyme. Pepper. Balsamic vinegar. Tahini. Hummus. Tomato sauce. Olives. Mushrooms. The items listed above may not be all the foods and drinks you can have. Talk to a dietitian to learn more. What foods should I limit? This is a list of foods that should be eaten rarely. Fruits Fruit canned in syrup. Vegetables Deep-fried potatoes, like French fries. Grains Packaged pasta or rice dishes. Cereal with added sugar. Snacks with added sugar. Meats and other proteins Beef. Pork. Lamb. Chicken or turkey with skin. Hot dogs. [place]. Dairy Ice cream. Sour cream. Whole milk. Fats and oils Butter. Canola oil. Vegetable oil. Beef fat (tallow). Lard. Beverages Juice. Sugar-sweetened soft drinks. Beer. Liquor and spirits. Sweets and desserts Cookies. Cakes. Pies. Candy. Seasonings and condiments Mayonnaise. Pre-made sauces and marinades. The items listed above may not be all the foods and drinks you should limit. Talk to a dietitian to learn more. Where to find more information American Heart Association (AHA): heart.org This information is not intended to replace advice given to you by your health care provider. Make sure you discuss any questions you have with your health care  provider. Document Revised: 08/05/2022 Document Reviewed: 08/05/2022 Elsevier Patient Education  2024 [employer].

## 2024-06-11 NOTE — Assessment & Plan Note (Signed)
 Compliant with current meds (labetalol  200mg  BID, spironolactone  25mg , and talmisartan/hydrochlorothiazide  80/25mg  and use of CPAP machine Admits to high salt diet No tobacco use, no alcohol abuse Under the care of cardiology: Dr. Alisia BP Readings from Last 3 Encounters:  06/11/24 (!) 160/90  04/22/24 135/72  03/23/24 138/80    We discussed ways to implement a low sodium diet and no salt seasoning suggestions. Advised to Check BP at home in AM, no less than 3x/week. Bring BP readings and machine to next appointment. Maintain a low salt/sodium diet, and daily exercise.  F/up in 12month

## 2024-07-09 ENCOUNTER — Ambulatory Visit: Admitting: Nurse Practitioner

## 2024-10-30 ENCOUNTER — Encounter: Admitting: Family

## 2025-04-14 ENCOUNTER — Ambulatory Visit: Admitting: Adult Health
